# Patient Record
Sex: Male | Born: 1960 | Marital: Married | Smoking: Never smoker
Health system: Southern US, Community
[De-identification: ages and names within clinical notes are randomized; demographics above are authoritative.]

## PROBLEM LIST (undated history)

## (undated) DIAGNOSIS — F900 Attention-deficit hyperactivity disorder, predominantly inattentive type: Secondary | ICD-10-CM

## (undated) DIAGNOSIS — F329 Major depressive disorder, single episode, unspecified: Secondary | ICD-10-CM

## (undated) DIAGNOSIS — F32A Depression, unspecified: Secondary | ICD-10-CM

## (undated) DIAGNOSIS — R079 Chest pain, unspecified: Secondary | ICD-10-CM

## (undated) DIAGNOSIS — I10 Essential (primary) hypertension: Secondary | ICD-10-CM

## (undated) DIAGNOSIS — M1990 Unspecified osteoarthritis, unspecified site: Secondary | ICD-10-CM

## (undated) DIAGNOSIS — G4700 Insomnia, unspecified: Secondary | ICD-10-CM

## (undated) DIAGNOSIS — F419 Anxiety disorder, unspecified: Secondary | ICD-10-CM

## (undated) HISTORY — PX: COLONOSCOPY: SHX174

## (undated) HISTORY — PX: VARICOSE VEIN SURGERY: SHX832

## (undated) HISTORY — PX: WISDOM TOOTH EXTRACTION: SHX21

## (undated) HISTORY — PX: BICEPS TENDON REPAIR: SHX566

## (undated) HISTORY — PX: EYE SURGERY: SHX253

## (undated) HISTORY — PX: SHOULDER ARTHROSCOPY: SHX128

## (undated) SURGERY — Surgical Case
Anesthesia: *Unknown

---

## 1998-05-07 ENCOUNTER — Ambulatory Visit (HOSPITAL_COMMUNITY): Admission: RE | Admit: 1998-05-07 | Discharge: 1998-05-07 | Payer: Self-pay | Admitting: *Deleted

## 1998-05-07 ENCOUNTER — Encounter: Payer: Self-pay | Admitting: *Deleted

## 1998-05-14 ENCOUNTER — Encounter: Payer: Self-pay | Admitting: Orthopedic Surgery

## 1998-05-14 ENCOUNTER — Observation Stay (HOSPITAL_COMMUNITY): Admission: RE | Admit: 1998-05-14 | Discharge: 1998-05-15 | Payer: Self-pay | Admitting: Orthopedic Surgery

## 2005-04-22 ENCOUNTER — Ambulatory Visit: Payer: Self-pay | Admitting: Family Medicine

## 2013-04-10 NOTE — H&P (Signed)
History of Present Illness  The patient is a 53 year old male who presents today for follow up of their back. The patient is being followed for their central (low back) back pain. Symptoms reported today include: pain (improving), pain at night, aching, giving way (he states he has had several falls), weakness (right leg) and numbness. The patient states that they are doing poorly. Current treatment includes: pain medications. The following medication has been used for pain control: Tramadol (does not help). The patient reports their current pain level to be 1 / 10. The patient presents today following MRI (lumbar @ [facility]). Note for "Follow-up back": The patient has another injection with Dr Nelva Bush schedule for 02-21-13   He returns today for followup. At this point in time, he continues to have mild right radicular leg pain. The biggest problem he has noticed is weakness.   Allergies No Known Drug Allergies. 08/22/2012    Family History Hypertension. Father. father Rheumatoid Arthritis. grandmother mothers side Depression. grandfather mothers side    Social History Alcohol use. current drinker; drinks wine; only occasionally per week Children. 2 Current work status. working full time Drug/Alcohol Rehab (Currently). no Drug/Alcohol Rehab (Previously). no Exercise. Exercises daily; does running / walking and gym / weights Illicit drug use. no Living situation. live with spouse Marital status. married Number of flights of stairs before winded. 4-5 Pain Contract. no Tobacco / smoke exposure. no Tobacco use. Never smoker. never smoker; uses less than half 1/2 can(s) smokeless per week    Medication History TraMADol HCl (50MG  Tablet, 1 (one) Tablet Oral tid, Taken starting 03/06/2013) Active. (ddb/rcy) Medications Reconciled.    Other Problems Depression    Objective Transcription  On clinical exam the single leg dip on the right side,  he almost collapses to the floor. Mainly his quad strength is significantly diminished compared to the left side. EHL, tibialis anterior, gastrocnemius all 5/5. He has numbness in the anterior thigh. This has definitely progressed since his last visit in December 2014.    RADIOGRAPHS  His MRI of 03/11/2013 shows the right paracentral disc extrusion at L3-L4 is larger than the 10/08/2012 MRI and it extends further caudal from the disc level then previously with marked mass effect on the ventral thecal sac and traversing L4 nerve root and even the exiting L3 nerve root. It also produces severe canal stenosis, which is also progressed. He has moderate changes at L4-L5 and L5-S1.   Assessment & Plan  Risks of surgery include, but are not limited to: Death, stroke, paralysis, nerve root damage/injury, bleeding, blood clots, loss of bowel/bladder control, sexual dysfunction, retrograde ejaculation, hardware failure, or malposition, spinal fluid leak, adjacent segment disease, non-union, need for further surgery, ongoing or worse pain, injury to bladder, bowel and abdominal contents, infection and recurrent disc herniation   At this point in time clinically I am quit concerned about the progressive weakness. Even though his pain is not debilitating, he is definitely showing progressive neurological deterioration. I have counseled him about proceeding with the lumbar decompression and discectomy, but he has financial concerns which I can understand. He is going to talk to his insurance company and consider his options. If he decides not to proceed with surgery at this point then the only thing I could recommend is a therapy program to strengthen his back muscles as best we can. Also to try to maintain the strength of his quad and hip flexors on the right side as best we can. I did  tell him that the longer the nerve goes pressed, the less predictable the outcome. All his questions were encouraged. He was  present for the dictation. I will see him in 4 weeks unless he makes a decision about surgery before then. He knows to contact me either by email through our web portal or calling me directly here at the office to let me know how he would like to proceed. If he contacts me, I would proceed with the L3-L4 decompression and discectomy. The risks include infection, bleeding, nerve damage, death, stroke, paralysis, failure to heal, need for further surgery such as fusion surgery, ongoing or worse pain, no relief of his current pain.

## 2013-04-12 ENCOUNTER — Encounter (HOSPITAL_COMMUNITY): Payer: Self-pay | Admitting: Pharmacy Technician

## 2013-04-17 NOTE — Pre-Procedure Instructions (Signed)
KHUP SAPIA  04/17/2013   Your procedure is scheduled on:  Thursday, February 26.  Report to [facility], Main Entrance [facility] "[facility]" at 10:20 AM.  Call this number if you have problems the morning of surgery: [phone]   Remember:   Do not eat food or drink liquids after midnight Wednesday.   Take these medicines the morning of surgery with A SIP OF WATER: Take if needed:  traMADol Veatrice Bourbon). Stop taking: ibuprofen (ADVIL,MOTRIN).     Do not wear jewelry, make-up or nail polish.  Do not wear lotions, powders, or perfumes. You may wear deodorant.   Men may shave face and neck.  Do not bring valuables to the hospital.  [facility] is not responsible for any belongings or valuables.               Contacts, dentures or bridgework may not be worn into surgery.  Leave suitcase in the car. After surgery it may be brought to your room.  For patients admitted to the hospital, discharge time is determined by your treatment team.               Patients discharged the day of surgery will not be allowed to drive home.  Name and phone number of your driver: -   Special Instructions: Review  [facility] - Preparing For Surgery.   Please read over the following fact sheets that you were given: Pain Booklet, Coughing and Deep Breathing and Surgical Site Infection Prevention

## 2013-04-18 ENCOUNTER — Encounter (HOSPITAL_COMMUNITY): Payer: Self-pay

## 2013-04-18 ENCOUNTER — Encounter (HOSPITAL_COMMUNITY)
Admission: RE | Admit: 2013-04-18 | Discharge: 2013-04-18 | Disposition: A | Discharge status: Home / Self Care | Payer: PRIVATE HEALTH INSURANCE | Source: Ambulatory Visit | Attending: Orthopedic Surgery | Admitting: Orthopedic Surgery

## 2013-04-18 HISTORY — DX: Unspecified osteoarthritis, unspecified site: M19.90

## 2013-04-18 LAB — CBC
HEMATOCRIT: 46.1 % (ref 39.0–52.0)
HEMOGLOBIN: 16.2 g/dL (ref 13.0–17.0)
MCH: 29.8 pg (ref 26.0–34.0)
MCHC: 35.1 g/dL (ref 30.0–36.0)
MCV: 84.9 fL (ref 78.0–100.0)
Platelets: 261 10*3/uL (ref 150–400)
RBC: 5.43 MIL/uL (ref 4.22–5.81)
RDW: 13.1 % (ref 11.5–15.5)
WBC: 6.1 10*3/uL (ref 4.0–10.5)

## 2013-04-18 LAB — SURGICAL PCR SCREEN
MRSA, PCR: NEGATIVE
STAPHYLOCOCCUS AUREUS: NEGATIVE

## 2013-04-19 MED ORDER — CEFAZOLIN SODIUM-DEXTROSE 2-3 GM-% IV SOLR
2.0000 g | INTRAVENOUS | Status: AC
  Administered 2013-04-20: 2 g via INTRAVENOUS
  Filled 2013-04-19: qty 50

## 2013-04-19 MED ORDER — DEXAMETHASONE SODIUM PHOSPHATE 4 MG/ML IJ SOLN
4.0000 mg | Freq: Once | INTRAMUSCULAR | Status: DC
  Filled 2013-04-19: qty 1

## 2013-04-19 MED ORDER — ACETAMINOPHEN 10 MG/ML IV SOLN: 1000.0000 mg | Freq: Four times a day (QID) | INTRAVENOUS | Status: DC

## 2013-04-20 ENCOUNTER — Encounter (HOSPITAL_COMMUNITY): Payer: PRIVATE HEALTH INSURANCE | Admitting: Anesthesiology

## 2013-04-20 ENCOUNTER — Ambulatory Visit (HOSPITAL_COMMUNITY): Payer: PRIVATE HEALTH INSURANCE | Admitting: Anesthesiology

## 2013-04-20 ENCOUNTER — Ambulatory Visit (HOSPITAL_COMMUNITY): Payer: PRIVATE HEALTH INSURANCE

## 2013-04-20 ENCOUNTER — Encounter (HOSPITAL_COMMUNITY)
Admission: RE | Disposition: A | Discharge status: Home / Self Care | Payer: Self-pay | Source: Ambulatory Visit | Attending: Orthopedic Surgery

## 2013-04-20 ENCOUNTER — Observation Stay (HOSPITAL_COMMUNITY)
Admission: RE | Admit: 2013-04-20 | Discharge: 2013-04-21 | Disposition: A | Discharge status: Home / Self Care | Payer: PRIVATE HEALTH INSURANCE | Source: Ambulatory Visit | Attending: Orthopedic Surgery | Admitting: Orthopedic Surgery

## 2013-04-20 ENCOUNTER — Encounter (HOSPITAL_COMMUNITY): Payer: Self-pay | Admitting: *Deleted

## 2013-04-20 DIAGNOSIS — F3289 Other specified depressive episodes: Secondary | ICD-10-CM | POA: Insufficient documentation

## 2013-04-20 DIAGNOSIS — M5126 Other intervertebral disc displacement, lumbar region: Principal | ICD-10-CM | POA: Insufficient documentation

## 2013-04-20 DIAGNOSIS — M549 Dorsalgia, unspecified: Secondary | ICD-10-CM | POA: Diagnosis present

## 2013-04-20 DIAGNOSIS — F329 Major depressive disorder, single episode, unspecified: Secondary | ICD-10-CM | POA: Insufficient documentation

## 2013-04-20 DIAGNOSIS — M48061 Spinal stenosis, lumbar region without neurogenic claudication: Secondary | ICD-10-CM | POA: Insufficient documentation

## 2013-04-20 DIAGNOSIS — Z9889 Other specified postprocedural states: Secondary | ICD-10-CM

## 2013-04-20 DIAGNOSIS — F17200 Nicotine dependence, unspecified, uncomplicated: Secondary | ICD-10-CM | POA: Insufficient documentation

## 2013-04-20 HISTORY — PX: LUMBAR LAMINECTOMY/DECOMPRESSION MICRODISCECTOMY: SHX5026

## 2013-04-20 SURGERY — LUMBAR LAMINECTOMY/DECOMPRESSION MICRODISCECTOMY 1 LEVEL
Anesthesia: General | Site: Back | Laterality: Right

## 2013-04-20 MED ORDER — PROPOFOL 10 MG/ML IV BOLUS
INTRAVENOUS | Status: DC | PRN
  Administered 2013-04-20: 190 mg via INTRAVENOUS

## 2013-04-20 MED ORDER — LIDOCAINE HCL (CARDIAC) 20 MG/ML IV SOLN
INTRAVENOUS | Status: AC
  Filled 2013-04-20: qty 5

## 2013-04-20 MED ORDER — SODIUM CHLORIDE 0.9 % IJ SOLN: 3.0000 mL | Freq: Two times a day (BID) | INTRAMUSCULAR | Status: DC

## 2013-04-20 MED ORDER — LIDOCAINE HCL (CARDIAC) 20 MG/ML IV SOLN
INTRAVENOUS | Status: DC | PRN
  Administered 2013-04-20: 100 mg via INTRAVENOUS

## 2013-04-20 MED ORDER — MIDAZOLAM HCL 2 MG/2ML IJ SOLN
INTRAMUSCULAR | Status: AC
  Filled 2013-04-20: qty 2

## 2013-04-20 MED ORDER — OXYCODONE HCL 5 MG PO TABS
ORAL_TABLET | ORAL | Status: AC
  Filled 2013-04-20: qty 1

## 2013-04-20 MED ORDER — HYDROMORPHONE HCL PF 1 MG/ML IJ SOLN
0.2500 mg | INTRAMUSCULAR | Status: DC | PRN
  Administered 2013-04-20 (×4): 0.5 mg via INTRAVENOUS

## 2013-04-20 MED ORDER — THROMBIN 20000 UNITS EX KIT: PACK | CUTANEOUS | Status: DC | PRN

## 2013-04-20 MED ORDER — MIDAZOLAM HCL 5 MG/5ML IJ SOLN
INTRAMUSCULAR | Status: DC | PRN
  Administered 2013-04-20: 2 mg via INTRAVENOUS

## 2013-04-20 MED ORDER — SODIUM CHLORIDE 0.9 % IJ SOLN: 3.0000 mL | INTRAMUSCULAR | Status: DC | PRN

## 2013-04-20 MED ORDER — GLYCOPYRROLATE 0.2 MG/ML IJ SOLN
INTRAMUSCULAR | Status: AC
  Filled 2013-04-20: qty 2

## 2013-04-20 MED ORDER — ACETAMINOPHEN 10 MG/ML IV SOLN
1000.0000 mg | Freq: Once | INTRAVENOUS | Status: AC
  Administered 2013-04-20: 1000 mg via INTRAVENOUS
  Filled 2013-04-20: qty 100

## 2013-04-20 MED ORDER — PHENOL 1.4 % MT LIQD: 1.0000 | OROMUCOSAL | Status: DC | PRN

## 2013-04-20 MED ORDER — THROMBIN 20000 UNITS EX SOLR
CUTANEOUS | Status: AC
  Filled 2013-04-20: qty 20000

## 2013-04-20 MED ORDER — ROCURONIUM BROMIDE 50 MG/5ML IV SOLN
INTRAVENOUS | Status: AC
  Filled 2013-04-20: qty 1

## 2013-04-20 MED ORDER — CEFAZOLIN SODIUM 1-5 GM-% IV SOLN
1.0000 g | Freq: Three times a day (TID) | INTRAVENOUS | Status: AC
  Administered 2013-04-20 – 2013-04-21 (×2): 1 g via INTRAVENOUS
  Filled 2013-04-20 (×2): qty 50

## 2013-04-20 MED ORDER — ONDANSETRON HCL 4 MG/2ML IJ SOLN
INTRAMUSCULAR | Status: AC
  Filled 2013-04-20: qty 2

## 2013-04-20 MED ORDER — NEOSTIGMINE METHYLSULFATE 1 MG/ML IJ SOLN
INTRAMUSCULAR | Status: DC | PRN
  Administered 2013-04-20: 3 mg via INTRAVENOUS

## 2013-04-20 MED ORDER — ONDANSETRON HCL 4 MG/2ML IJ SOLN: 4.0000 mg | INTRAMUSCULAR | Status: DC | PRN

## 2013-04-20 MED ORDER — ONDANSETRON HCL 4 MG/2ML IJ SOLN
INTRAMUSCULAR | Status: DC | PRN
  Administered 2013-04-20: 4 mg via INTRAVENOUS

## 2013-04-20 MED ORDER — FENTANYL CITRATE 0.05 MG/ML IJ SOLN
INTRAMUSCULAR | Status: AC
  Filled 2013-04-20: qty 2

## 2013-04-20 MED ORDER — PROPOFOL 10 MG/ML IV BOLUS
INTRAVENOUS | Status: AC
  Filled 2013-04-20: qty 20

## 2013-04-20 MED ORDER — ROCURONIUM BROMIDE 100 MG/10ML IV SOLN
INTRAVENOUS | Status: DC | PRN
  Administered 2013-04-20: 50 mg via INTRAVENOUS

## 2013-04-20 MED ORDER — ARTIFICIAL TEARS OP OINT
TOPICAL_OINTMENT | OPHTHALMIC | Status: DC | PRN
  Administered 2013-04-20: 1 via OPHTHALMIC

## 2013-04-20 MED ORDER — DEXAMETHASONE 4 MG PO TABS
4.0000 mg | ORAL_TABLET | Freq: Four times a day (QID) | ORAL | Status: DC
  Administered 2013-04-20 – 2013-04-21 (×3): 4 mg via ORAL
  Filled 2013-04-20 (×7): qty 1

## 2013-04-20 MED ORDER — OXYCODONE HCL 5 MG PO TABS
10.0000 mg | ORAL_TABLET | ORAL | Status: DC | PRN
  Administered 2013-04-20 – 2013-04-21 (×3): 10 mg via ORAL
  Filled 2013-04-20 (×3): qty 2

## 2013-04-20 MED ORDER — METHOCARBAMOL 100 MG/ML IJ SOLN
500.0000 mg | Freq: Four times a day (QID) | INTRAMUSCULAR | Status: DC | PRN
  Filled 2013-04-20: qty 5

## 2013-04-20 MED ORDER — LACTATED RINGERS IV SOLN: INTRAVENOUS | Status: DC

## 2013-04-20 MED ORDER — THROMBIN 20000 UNITS EX SOLR
CUTANEOUS | Status: DC | PRN
  Administered 2013-04-20: 13:00:00 via TOPICAL

## 2013-04-20 MED ORDER — OXYCODONE HCL 5 MG/5ML PO SOLN: 5.0000 mg | Freq: Once | ORAL | Status: AC | PRN

## 2013-04-20 MED ORDER — 0.9 % SODIUM CHLORIDE (POUR BTL) OPTIME
TOPICAL | Status: DC | PRN
  Administered 2013-04-20: 1000 mL

## 2013-04-20 MED ORDER — ACETAMINOPHEN 10 MG/ML IV SOLN
1000.0000 mg | Freq: Four times a day (QID) | INTRAVENOUS | Status: DC
  Administered 2013-04-20 – 2013-04-21 (×3): 1000 mg via INTRAVENOUS
  Filled 2013-04-20 (×4): qty 100

## 2013-04-20 MED ORDER — FENTANYL CITRATE 0.05 MG/ML IJ SOLN
INTRAMUSCULAR | Status: AC
Start: 2013-04-20 — End: 2013-04-20
  Filled 2013-04-20: qty 5

## 2013-04-20 MED ORDER — HYDROMORPHONE HCL PF 1 MG/ML IJ SOLN
INTRAMUSCULAR | Status: AC
  Filled 2013-04-20: qty 1

## 2013-04-20 MED ORDER — BUPIVACAINE-EPINEPHRINE (PF) 0.25% -1:200000 IJ SOLN
INTRAMUSCULAR | Status: AC
  Filled 2013-04-20: qty 30

## 2013-04-20 MED ORDER — SODIUM CHLORIDE 0.9 % IV SOLN
250.0000 mL | INTRAVENOUS | Status: DC
  Administered 2013-04-20: 250 mL via INTRAVENOUS

## 2013-04-20 MED ORDER — METHOCARBAMOL 500 MG PO TABS
ORAL_TABLET | ORAL | Status: AC
  Filled 2013-04-20: qty 1

## 2013-04-20 MED ORDER — MIDAZOLAM HCL 2 MG/2ML IJ SOLN: 1.0000 mg | INTRAMUSCULAR | Status: DC | PRN

## 2013-04-20 MED ORDER — MORPHINE SULFATE 2 MG/ML IJ SOLN
1.0000 mg | INTRAMUSCULAR | Status: DC | PRN
  Administered 2013-04-20 (×2): 4 mg via INTRAVENOUS
  Filled 2013-04-20 (×2): qty 2

## 2013-04-20 MED ORDER — DEXAMETHASONE SODIUM PHOSPHATE 10 MG/ML IJ SOLN
INTRAMUSCULAR | Status: AC
  Filled 2013-04-20: qty 1

## 2013-04-20 MED ORDER — DEXAMETHASONE SODIUM PHOSPHATE 10 MG/ML IJ SOLN
INTRAMUSCULAR | Status: DC | PRN
  Administered 2013-04-20: 10 mg via INTRAVENOUS

## 2013-04-20 MED ORDER — METHOCARBAMOL 500 MG PO TABS
500.0000 mg | ORAL_TABLET | Freq: Four times a day (QID) | ORAL | Status: DC | PRN
  Administered 2013-04-20 – 2013-04-21 (×3): 500 mg via ORAL
  Filled 2013-04-20 (×2): qty 1

## 2013-04-20 MED ORDER — BUPIVACAINE-EPINEPHRINE 0.25% -1:200000 IJ SOLN
INTRAMUSCULAR | Status: DC | PRN
  Administered 2013-04-20: 10 mL

## 2013-04-20 MED ORDER — PROMETHAZINE HCL 25 MG/ML IJ SOLN: 6.2500 mg | INTRAMUSCULAR | Status: DC | PRN

## 2013-04-20 MED ORDER — OXYCODONE HCL 5 MG PO TABS
5.0000 mg | ORAL_TABLET | Freq: Once | ORAL | Status: AC | PRN
  Administered 2013-04-20: 5 mg via ORAL

## 2013-04-20 MED ORDER — DEXAMETHASONE SODIUM PHOSPHATE 4 MG/ML IJ SOLN
4.0000 mg | Freq: Four times a day (QID) | INTRAMUSCULAR | Status: DC
  Filled 2013-04-20 (×4): qty 1

## 2013-04-20 MED ORDER — GLYCOPYRROLATE 0.2 MG/ML IJ SOLN
INTRAMUSCULAR | Status: DC | PRN
  Administered 2013-04-20: 0.4 mg via INTRAVENOUS

## 2013-04-20 MED ORDER — FENTANYL CITRATE 0.05 MG/ML IJ SOLN
INTRAMUSCULAR | Status: DC | PRN
  Administered 2013-04-20 (×2): 50 ug via INTRAVENOUS
  Administered 2013-04-20: 100 ug via INTRAVENOUS

## 2013-04-20 MED ORDER — LACTATED RINGERS IV SOLN
INTRAVENOUS | Status: DC
  Administered 2013-04-20: 10:00:00 via INTRAVENOUS

## 2013-04-20 MED ORDER — FENTANYL CITRATE 0.05 MG/ML IJ SOLN: 50.0000 ug | Freq: Once | INTRAMUSCULAR | Status: DC

## 2013-04-20 MED ORDER — NEOSTIGMINE METHYLSULFATE 1 MG/ML IJ SOLN
INTRAMUSCULAR | Status: AC
  Filled 2013-04-20: qty 10

## 2013-04-20 MED ORDER — LACTATED RINGERS IV SOLN
INTRAVENOUS | Status: DC | PRN
  Administered 2013-04-20 (×2): via INTRAVENOUS

## 2013-04-20 MED ORDER — MENTHOL 3 MG MT LOZG
1.0000 | LOZENGE | OROMUCOSAL | Status: DC | PRN
  Administered 2013-04-20: 3 mg via ORAL
  Filled 2013-04-20: qty 9

## 2013-04-20 MED ORDER — HEMOSTATIC AGENTS (NO CHARGE) OPTIME
TOPICAL | Status: DC | PRN
  Administered 2013-04-20: 1 via TOPICAL

## 2013-04-20 SURGICAL SUPPLY — 59 items
BUR EGG ELITE 4.0 (BURR) IMPLANT
BUR EGG ELITE 4.0MM (BURR)
BUR MATCHSTICK NEURO 3.0 LAGG (BURR) IMPLANT
CANISTER SUCTION 2500CC (MISCELLANEOUS) ×3 IMPLANT
CLOSURE STERI-STRIP 1/2X4 (GAUZE/BANDAGES/DRESSINGS) ×1
CLOTH BEACON ORANGE TIMEOUT ST (SAFETY) ×3 IMPLANT
CLSR STERI-STRIP ANTIMIC 1/2X4 (GAUZE/BANDAGES/DRESSINGS) ×2 IMPLANT
CORDS BIPOLAR (ELECTRODE) ×3 IMPLANT
COVER SURGICAL LIGHT HANDLE (MISCELLANEOUS) ×3 IMPLANT
DRAIN CHANNEL 15F RND FF W/TCR (WOUND CARE) ×3 IMPLANT
DRAPE POUCH INSTRU U-SHP 10X18 (DRAPES) ×3 IMPLANT
DRAPE SURG 17X23 STRL (DRAPES) ×3 IMPLANT
DRAPE U-SHAPE 47X51 STRL (DRAPES) ×3 IMPLANT
DRSG MEPILEX BORDER 4X4 (GAUZE/BANDAGES/DRESSINGS) IMPLANT
DRSG MEPILEX BORDER 4X8 (GAUZE/BANDAGES/DRESSINGS) ×3 IMPLANT
DURAPREP 26ML APPLICATOR (WOUND CARE) ×3 IMPLANT
ELECT BLADE 4.0 EZ CLEAN MEGAD (MISCELLANEOUS)
ELECT CAUTERY BLADE 6.4 (BLADE) ×3 IMPLANT
ELECT REM PT RETURN 9FT ADLT (ELECTROSURGICAL) ×3
ELECTRODE BLDE 4.0 EZ CLN MEGD (MISCELLANEOUS) IMPLANT
ELECTRODE REM PT RTRN 9FT ADLT (ELECTROSURGICAL) ×1 IMPLANT
EVACUATOR SILICONE 100CC (DRAIN) ×3 IMPLANT
GLOVE BIOGEL PI IND STRL 8 (GLOVE) ×1 IMPLANT
GLOVE BIOGEL PI IND STRL 8.5 (GLOVE) ×1 IMPLANT
GLOVE BIOGEL PI INDICATOR 8 (GLOVE) ×2
GLOVE BIOGEL PI INDICATOR 8.5 (GLOVE) ×2
GLOVE ECLIPSE 8.5 STRL (GLOVE) ×3 IMPLANT
GLOVE ORTHO TXT STRL SZ7.5 (GLOVE) ×3 IMPLANT
GOWN STRL NON-REIN LRG LVL3 (GOWN DISPOSABLE) ×3 IMPLANT
GOWN STRL REIN 2XL XLG LVL4 (GOWN DISPOSABLE) ×3 IMPLANT
GOWN STRL REIN XL XLG (GOWN DISPOSABLE) ×6 IMPLANT
GOWN STRL REUS W/TWL 2XL LVL3 (GOWN DISPOSABLE) ×3 IMPLANT
KIT BASIN OR (CUSTOM PROCEDURE TRAY) ×3 IMPLANT
KIT ROOM TURNOVER OR (KITS) ×3 IMPLANT
NDL SPNL 18GX3.5 QUINCKE PK (NEEDLE) ×2 IMPLANT
NEEDLE 22X1 1/2 (OR ONLY) (NEEDLE) ×3 IMPLANT
NEEDLE SPNL 18GX3.5 QUINCKE PK (NEEDLE) ×6 IMPLANT
NS IRRIG 1000ML POUR BTL (IV SOLUTION) ×3 IMPLANT
PACK LAMINECTOMY ORTHO (CUSTOM PROCEDURE TRAY) ×3 IMPLANT
PACK UNIVERSAL I (CUSTOM PROCEDURE TRAY) ×3 IMPLANT
PAD ARMBOARD 7.5X6 YLW CONV (MISCELLANEOUS) ×6 IMPLANT
PATTIES SURGICAL .5 X.5 (GAUZE/BANDAGES/DRESSINGS) IMPLANT
PATTIES SURGICAL .5 X1 (DISPOSABLE) ×3 IMPLANT
SPONGE LAP 4X18 X RAY DECT (DISPOSABLE) ×4 IMPLANT
SPONGE SURGIFOAM ABS GEL 100 (HEMOSTASIS) IMPLANT
SURGIFLO TRUKIT (HEMOSTASIS) ×2 IMPLANT
SUT MON AB 3-0 SH 27 (SUTURE) ×3
SUT MON AB 3-0 SH27 (SUTURE) ×1 IMPLANT
SUT VIC AB 0 CT1 27 (SUTURE) ×3
SUT VIC AB 0 CT1 27XBRD ANBCTR (SUTURE) ×1 IMPLANT
SUT VIC AB 1 CTX 36 (SUTURE) ×6
SUT VIC AB 1 CTX36XBRD ANBCTR (SUTURE) ×2 IMPLANT
SUT VIC AB 2-0 CT1 18 (SUTURE) ×3 IMPLANT
SYR BULB IRRIGATION 50ML (SYRINGE) ×3 IMPLANT
SYR CONTROL 10ML LL (SYRINGE) ×3 IMPLANT
TOWEL OR 17X24 6PK STRL BLUE (TOWEL DISPOSABLE) ×3 IMPLANT
TOWEL OR 17X26 10 PK STRL BLUE (TOWEL DISPOSABLE) ×3 IMPLANT
WATER STERILE IRR 1000ML POUR (IV SOLUTION) ×1 IMPLANT
YANKAUER SUCT BULB TIP NO VENT (SUCTIONS) ×3 IMPLANT

## 2013-04-20 NOTE — H&P (Signed)
No change in clinical exam H+P reviewed  

## 2013-04-20 NOTE — Preoperative (Signed)
Beta Blockers   Reason not to administer Beta Blockers:Not Applicable 

## 2013-04-20 NOTE — Brief Op Note (Signed)
04/20/2013  1:58 PM  PATIENT:  Christopher Rogers  53 y.o. 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  PROCEDURE:  Procedure(s): L3-L4 DECOMPRESSION DISCECTOMY (1 LEVEL) (Right)  SURGEON:  Surgeon(s) and Role:    * Melina Schools, MD - Primary  PHYSICIAN ASSISTANT:   ASSISTANTS: Benjiman Core   ANESTHESIA:   general  EBL:  Total I/O In: 1150 [I.V.:1150] Out: -   BLOOD ADMINISTERED:none  DRAINS: none   LOCAL MEDICATIONS USED:  MARCAINE     SPECIMEN:  No Specimen  DISPOSITION OF SPECIMEN:  N/A  COUNTS:  YES  TOURNIQUET:  * No tourniquets in log *  DICTATION: .Other Dictation: Dictation Number [id number]  PLAN OF CARE: Admit for overnight observation  PATIENT DISPOSITION:  PACU - hemodynamically stable.

## 2013-04-20 NOTE — Transfer of Care (Signed)
Immediate Anesthesia Transfer of Care Note  Patient: Christopher Rogers  Procedure(s) Performed: Procedure(s): L3-L4 DECOMPRESSION DISCECTOMY (1 LEVEL) (Right)  Patient Location: PACU  Anesthesia Type:General  Level of Consciousness: oriented and patient cooperative  Airway & Oxygen Therapy: Patient Spontanous Breathing and Patient connected to nasal cannula oxygen  Post-op Assessment: Report given to PACU RN and Post -op Vital signs reviewed and stable  Post vital signs: Reviewed  Complications: No apparent anesthesia complications

## 2013-04-20 NOTE — Anesthesia Procedure Notes (Signed)
Procedure Name: Intubation Date/Time: 04/20/2013 12:22 PM Performed by: Jenne Campus Pre-anesthesia Checklist: Patient identified, Emergency Drugs available, Suction available, Patient being monitored and Timeout performed Patient Re-evaluated:Patient Re-evaluated prior to inductionOxygen Delivery Method: Circle system utilized Preoxygenation: Pre-oxygenation with 100% oxygen Intubation Type: IV induction Ventilation: Mask ventilation without difficulty Laryngoscope Size: Miller and 2 Grade View: Grade II Tube type: Oral Tube size: 7.5 mm Number of attempts: 1 Airway Equipment and Method: Stylet Placement Confirmation: ETT inserted through vocal cords under direct vision,  positive ETCO2,  CO2 detector and breath sounds checked- equal and bilateral Secured at: 22 cm Tube secured with: Tape Dental Injury: Teeth and Oropharynx as per pre-operative assessment

## 2013-04-20 NOTE — Anesthesia Preprocedure Evaluation (Signed)
Anesthesia Evaluation  Patient identified by MRN, date of birth, ID band Patient awake    Reviewed: Allergy & Precautions, H&P , NPO status , Patient's Chart, lab work & pertinent test results  History of Anesthesia Complications (+) PONV  Airway Mallampati: II TM Distance: >3 FB Neck ROM: Full    Dental   Pulmonary former smoker,  breath sounds clear to auscultation        Cardiovascular Rhythm:Regular Rate:Normal     Neuro/Psych    GI/Hepatic   Endo/Other    Renal/GU      Musculoskeletal   Abdominal   Peds  Hematology   Anesthesia Other Findings   Reproductive/Obstetrics                           Anesthesia Physical Anesthesia Plan  ASA: I  Anesthesia Plan: General   Post-op Pain Management:    Induction: Intravenous  Airway Management Planned: Oral ETT  Additional Equipment:   Intra-op Plan:   Post-operative Plan: Extubation in OR  Informed Consent: I have reviewed the patients History and Physical, chart, labs and discussed the procedure including the risks, benefits and alternatives for the proposed anesthesia with the patient or authorized representative who has indicated his/her understanding and acceptance.     Plan Discussed with: CRNA and Surgeon  Anesthesia Plan Comments:         Anesthesia Quick Evaluation

## 2013-04-21 ENCOUNTER — Encounter (HOSPITAL_COMMUNITY): Payer: Self-pay | Admitting: Orthopedic Surgery

## 2013-04-21 MED ORDER — METHOCARBAMOL 500 MG PO TABS: 500.0000 mg | ORAL_TABLET | Freq: Four times a day (QID) | ORAL | Status: DC | PRN

## 2013-04-21 MED ORDER — ONDANSETRON 4 MG PO TBDP: 4.0000 mg | ORAL_TABLET | Freq: Three times a day (TID) | ORAL | Status: DC | PRN

## 2013-04-21 MED ORDER — POLYETHYLENE GLYCOL 3350 17 G PO PACK: 17.0000 g | PACK | Freq: Every day | ORAL | Status: DC

## 2013-04-21 MED ORDER — OXYCODONE-ACETAMINOPHEN 10-325 MG PO TABS: 1.0000 | ORAL_TABLET | Freq: Four times a day (QID) | ORAL | Status: DC | PRN

## 2013-04-21 MED ORDER — ZOLPIDEM TARTRATE ER 6.25 MG PO TBCR: 6.2500 mg | EXTENDED_RELEASE_TABLET | Freq: Every evening | ORAL | Status: DC | PRN

## 2013-04-21 MED ORDER — DOCUSATE SODIUM 100 MG PO CAPS: 100.0000 mg | ORAL_CAPSULE | Freq: Two times a day (BID) | ORAL | Status: DC

## 2013-04-21 NOTE — Progress Notes (Signed)
Pt. Alert and oriented,follows simple instructions, denies pain. Incision area without swelling, redness or S/S of infection. Voiding adequate clear yellow urine. Moving all extremities well and vitals stable and documented. Lumbar surgery notes instructions given to patient and family member for home safety and precautions. Pt. and family stated understanding of instructions given.  

## 2013-04-21 NOTE — Anesthesia Postprocedure Evaluation (Signed)
  Anesthesia Post-op Note  Patient: Christopher Rogers  Procedure(s) Performed: Procedure(s): L3-L4 DECOMPRESSION DISCECTOMY (1 LEVEL) (Right)  Patient Location: PACU  Anesthesia Type:General  Level of Consciousness: awake and alert   Airway and Oxygen Therapy: Patient Spontanous Breathing  Post-op Pain: mild  Post-op Assessment: Post-op Vital signs reviewed, Patient's Cardiovascular Status Stable and Respiratory Function Stable  Post-op Vital Signs: Reviewed  Filed Vitals:   04/21/13 0829  BP: 111/69  Pulse: 86  Temp: 37.1 C  Resp: 20    Complications: No apparent anesthesia complications

## 2013-04-21 NOTE — Evaluation (Signed)
Occupational Therapy Evaluation Patient Details Name: Christopher Rogers MRN: [id number] DOB: 1960-12-13 Today's Date: 04/21/2013 Time: 7371-0626 OT Time Calculation (min): 14 min  OT Assessment / Plan / Recommendation History of present illness s/p L3-L4 DECOMPRESSION DISCECTOMY    Clinical Impression   Pt admitted with above.  Pt is overall mod I level with mobility and ADLs.  Education completed. No further acute OT needed. Pt plans to d/c home today.    OT Assessment  Patient does not need any further OT services    Follow Up Recommendations  No OT follow up    Barriers to Discharge      Equipment Recommendations  None recommended by OT    Recommendations for Other Services    Frequency       Precautions / Restrictions Precautions Precautions: Back Precaution Comments: Educated pt on 3/3 back precautions.   Pertinent Vitals/Pain See vitals    ADL  Eating/Feeding: Performed;Independent Where Assessed - Eating/Feeding: Edge of bed Upper Body Bathing: Simulated;Modified independent Where Assessed - Upper Body Bathing: Unsupported sitting Lower Body Bathing: Simulated;Modified independent Where Assessed - Lower Body Bathing: Unsupported sit to stand Upper Body Dressing: Performed;Modified independent Where Assessed - Upper Body Dressing: Unsupported sitting Lower Body Dressing: Performed;Modified independent Where Assessed - Lower Body Dressing: Unsupported sit to stand Toilet Transfer: Simulated;Modified independent Toilet Transfer Method: Sit to Loss adjuster, chartered:  (bed) Transfers/Ambulation Related to ADLs: mod I ADL Comments: Educated pt and significant other on back precautions and ADL techniques to maintain precautions. Recommended having home set up when he will be alone so that he can retrieve ADL items without difficulty.    OT Diagnosis:    OT Problem List:   OT Treatment Interventions:     OT Goals(Current goals can be found in the care plan  section)    Visit Information  Last OT Received On: 04/21/13 History of Present Illness: s/p L3-L4 DECOMPRESSION DISCECTOMY        Prior Functioning     Home Living Family/patient expects to be discharged to:: Private residence Living Arrangements: Spouse/significant other Available Help at Discharge: Family;Available PRN/intermittently Type of Home: House Home Access: Stairs to enter Entrance Stairs-Number of Steps: 3-4 Home Layout: Two level Alternate Level Stairs-Number of Steps: 20 Home Equipment: None Additional Comments: Living room is located down stairs.  He can access living room from garage if needed.   Prior Function Level of Independence: Independent         Vision/Perception     Cognition  Cognition Arousal/Alertness: Awake/alert Behavior During Therapy: WFL for tasks assessed/performed Overall Cognitive Status: Within Functional Limits for tasks assessed    Extremity/Trunk Assessment Upper Extremity Assessment Upper Extremity Assessment: Overall WFL for tasks assessed     Mobility Bed Mobility Overal bed mobility: Modified Independent Transfers Overall transfer level: Modified independent     Exercise     Balance     End of Session OT - End of Session Activity Tolerance: Patient tolerated treatment well Patient left: with family/visitor present (standing in room) Nurse Communication: Mobility status  GO Functional Assessment Tool Used: clinical observation Functional Limitation: Self care Self Care Current Status (R4854): 0 percent impaired, limited or restricted Self Care Goal Status (O2703): 0 percent impaired, limited or restricted Self Care Discharge Status [phone]): 0 percent impaired, limited or restricted  04/21/2013 Darrol Jump OTR/L Pager [phone] Office [phone]  Darrol Jump 04/21/2013, 10:16 AM

## 2013-04-21 NOTE — Op Note (Signed)
NAMEMarland Kitchen  PROSPER, PAFF NO.:  [id number]  MEDICAL RECORD NO.:  [id number]  LOCATION:  3C06C                        FACILITY:  [facility]  PHYSICIAN:  Dahlia Bailiff, MD    DATE OF BIRTH:  05-24-1960  DATE OF PROCEDURE:  04/20/2013 DATE OF DISCHARGE:                              OPERATIVE REPORT   PREOPERATIVE DIAGNOSES:  Right displaced proximal humerus fracture dislocation, L3-4 spinal stenosis.  POSTOPERATIVE DIAGNOSES:  Right displaced proximal humerus fracture dislocation, L3-4 spinal stenosis.  OPERATIVE PROCEDURE:  Central decompression laminotomy L3-4 with facetectomy and partial diskectomy L3-4 right side.  COMPLICATIONS:  None.  CONDITION:  Stable.  FIRST ASSISTANT:  Benjiman Core, PA.  HISTORY:  This is a very pleasant gentleman who has had progressive weakness and right leg pain.  He has had back pain but it has gotten worse recently.  MRI confirmed of increase in size in the disk herniation at L3-4.  Given the progressive neurological deficits, we elected to proceed with surgery.  OPERATIVE NOTE:  The patient was brought to the operating room, placed supine on the operating table.  After successful induction of general anesthesia and endotracheal intubation, TEDs, SCDs were applied.  He was turned prone onto a Wilson frame.  All bony prominences were well padded and the back was prepped and draped in standard fashion.  Time-out was taken to confirm patient, procedure, and all other pertinent important data.  Once this was completed, 2 needles were placed in the back and an x-ray was taken to confirm incision site.  Once this was done, the incision site was infiltrated with 0.25% Marcaine and then a midline incision was made centered over the L3-4 disk.  Sharp dissection was carried out down to the deep fascia.  Deep fascia was sharply incised, and I stripped the paraspinal muscles using a Cobb elevator and a Bovie to expose the L3 and L4 spinous  processes.  I placed a Penfield 4 underneath the L3 lamina and took a second x-ray to confirm that I was at the appropriate level.  Once this was done, I removed the superior portion of the L4 spinous process and the inferior portion of the L3 spinous process.  I then used a fine nerve hook to develop a plane underneath the L3 lamina and performed a generous laminotomy of L3.  I then developed a plane in the central raphe with the plate and removed the ligamentum flavum using a 2 and 3 mm Kerrison rongeurs.  Once I had the central decompression, I then worked into the left lateral gutter removing the osteophyte and buckled ligamentum flavum.  Once I had the posterior lateral gutter decompressed, I then went to the contralateral side and began working on the right side of the decompression.  Once I had the majority of this done, I went back to the right side and then using a nerve hook began sweeping the posterolateral gutter.  I then expressed 3 large fragments of disk material and removed them.  At this point, I then identified the 3-4 disk space itself and identified the central disk herniation.  This was more calcified and chronic in nature. It was also adherent to the  undersurface of the thecal sac.  I performed an annulotomy with a 15 blade scalpel, and then began to remove a small fragment of the central disk, and at this point, I noticed how adherent was the undersurface of the thecal sac.  In order to avoid a ventral CSF leak, I elected not to continue trying to remove it.  I already had an excellent central decompression so there would be no further central stenosis.  At this point, I obtained hemostasis using bipolar electrocautery, and then irrigated the wound copiously with normal saline.  I placed thrombin-soaked Gelfoam patty over the exposed thecal sac.  I then closed in a layered fashion with interrupted #1 Vicryl sutures for the deep fascia and then a layer of 2-0 Vicryl  sutures and a 3-0 Monocryl.  Steri-Strips and a dry dressing were applied.  The patient was ultimately extubated and transferred to the PACU without incident.  At the end of the case, all needle and sponge counts were correct.  There was no adverse intraoperative event.  My PA was instrumental in assisting me with retraction, visualization, wound closure and suctioning and retraction.     Dahlia Bailiff, MD     DDB/MEDQ  D:  04/20/2013  T:  04/21/2013  Job:  [id number]

## 2013-04-21 NOTE — Progress Notes (Signed)
    Subjective: Procedure(s) (LRB): L3-L4 DECOMPRESSION DISCECTOMY (1 LEVEL) (Right) 1 Day Post-Op  Patient reports pain as 2 on 0-10 scale.  Reports decreased leg pain reports incisional back pain   Positive void Negative bowel movement Positive flatus Negative chest pain or shortness of breath  Objective: Vital signs in last 24 hours: Temp:  [97.7 F (36.5 C)-99.1 F (37.3 C)] 98.8 F (37.1 C) (02/27 0829) Pulse Rate:  [46-95] 86 (02/27 0829) Resp:  [10-24] 20 (02/27 0829) BP: (106-142)/(69-90) 111/69 mmHg (02/27 0829) SpO2:  [94 %-100 %] 94 % (02/27 0829)  Intake/Output from previous day: 02/26 0701 - 02/27 0700 In: 2140 [P.O.:240; I.V.:1900] Out: -   Labs:  Recent Labs  04/18/13 0930  WBC 6.1  RBC 5.43  HCT 46.1  PLT 261   No results found for this basename: NA, K, CL, CO2, BUN, CREATININE, GLUCOSE, CALCIUM,  in the last 72 hours No results found for this basename: LABPT, INR,  in the last 72 hours  Physical Exam: Neurologically intact ABD soft Neurovascular intact Intact pulses distally Incision: dressing C/D/I and no drainage Compartment soft  Assessment/Plan: Patient stable  xrays n/a Continue mobilization with physical therapy Continue care  Patient doing well D/c to home today Will send on percocet for pain, robaxin for spasms, and 3 day supply of Ambien for sleep   F/u 2 weeks    Melina Schools, MD [place] [phone]

## 2013-04-21 NOTE — Progress Notes (Signed)
PT Cancellation Note  Patient Details Name: AXCEL HORSCH MRN: [id number] DOB: December 20, 1960   Cancelled Treatment:    Reason Eval/Treat Not Completed: PT screened, no needs identified, will sign off   Qiana Landgrebe 04/21/2013, 9:43 AM

## 2013-04-28 NOTE — Discharge Summary (Signed)
Patient ID: JADIEN LEHIGH MRN: [id number] DOB/AGE: 54/26/1962 54 y.o.  Admit date: 04/20/2013 Discharge date: 04/28/2013  Admission Diagnoses:  Active Problems:   Back pain   Discharge Diagnoses:  Active Problems:   Back pain  status post Procedure(s): L3-L4 DECOMPRESSION DISCECTOMY (1 LEVEL)  Past Medical History  Diagnosis Date  . Complication of anesthesia   . PONV (postoperative nausea and vomiting)   . Arthritis     Surgeries: Procedure(s): L3-L4 DECOMPRESSION DISCECTOMY (1 LEVEL) on 04/20/2013   Consultants:    Discharged Condition: Improved  Hospital Course: Christopher Rogers is an 53 y.o. male who was admitted 04/20/2013 for operative treatment of lumbar stenosis. Patient failed conservative treatments (please see the history and physical for the specifics) and had severe unremitting pain that affects sleep, daily activities and work/hobbies. After pre-op clearance, the patient was taken to the operating room on 04/20/2013 and underwent  Procedure(s): L3-L4 DECOMPRESSION DISCECTOMY (1 LEVEL).    Patient was given perioperative antibiotics:  Anti-infectives   Start     Dose/Rate Route Frequency Ordered Stop   04/20/13 2000  ceFAZolin (ANCEF) IVPB 1 g/50 mL premix     1 g 100 mL/hr over 30 Minutes Intravenous Every 8 hours 04/20/13 1629 04/21/13 0403   04/19/13 1419  ceFAZolin (ANCEF) IVPB 2 g/50 mL premix     2 g 100 mL/hr over 30 Minutes Intravenous 30 min pre-op 04/19/13 1419 04/20/13 1223       Patient was given sequential compression devices and early ambulation to prevent DVT.   Patient benefited maximally from hospital stay and there were no complications. At the time of discharge, the patient was urinating/moving their bowels without difficulty, tolerating a regular diet, pain is controlled with oral pain medications and they have been cleared by PT/OT.   Recent vital signs: No data found.    Recent laboratory studies: No results found for this  basename: WBC, HGB, HCT, PLT, NA, K, CL, CO2, BUN, CREATININE, GLUCOSE, PT, INR, CALCIUM, 2,  in the last 72 hours   Discharge Medications:     Medication List    STOP taking these medications       acetaminophen 500 MG tablet  Commonly known as:  TYLENOL     ibuprofen 200 MG tablet  Commonly known as:  ADVIL,MOTRIN     traMADol 50 MG tablet  Commonly known as:  ULTRAM      TAKE these medications       docusate sodium 100 MG capsule  Commonly known as:  COLACE  Take 1 capsule (100 mg total) by mouth 2 (two) times daily.     methocarbamol 500 MG tablet  Commonly known as:  ROBAXIN  Take 1 tablet (500 mg total) by mouth every 6 (six) hours as needed for muscle spasms.     ondansetron 4 MG disintegrating tablet  Commonly known as:  ZOFRAN ODT  Take 1 tablet (4 mg total) by mouth every 8 (eight) hours as needed for nausea or vomiting.     oxyCODONE-acetaminophen 10-325 MG per tablet  Commonly known as:  PERCOCET  Take 1 tablet by mouth every 6 (six) hours as needed for pain.     polyethylene glycol packet  Commonly known as:  MIRALAX / GLYCOLAX  Take 17 g by mouth daily.     zolpidem 6.25 MG CR tablet  Commonly known as:  AMBIEN CR  Take 1 tablet (6.25 mg total) by mouth at bedtime as needed for  sleep.        Diagnostic Studies: Dg Lumbar Spine 2-3 Views  04/18/2013   CLINICAL DATA:  L 3 L4 decompression  EXAM: LUMBAR SPINE - 2-3 VIEW  COMPARISON:  None  FINDINGS: Two views of lumbar spine submitted. No acute fracture or subluxation. Mild anterior spurring at L2-L3 and L3-L4 level. Minimal anterior spurring upper endplate of L5. Mild disc space flattening at L2-L3 L4-L5 and L5-S1 level. Facet degenerative changes L4 and L5 level.  IMPRESSION: No acute fracture or subluxation. Degenerative changes as described above.   Electronically Signed   By: Lahoma Crocker M.D.   On: 04/18/2013 10:11   Dg Lumbar Spine 1 View  04/20/2013   CLINICAL DATA:  Back surgery  EXAM: LUMBAR  SPINE - 1 VIEW  COMPARISON:  None.  FINDINGS: Intraoperative imaging would localization was performed. Needles are utilized to mark the L2 pedicle and L3 inferior articular facet.  IMPRESSION: Intraoperative marking at L2 and L3.   Electronically Signed   By: Maryclare Bean M.D.   On: 04/20/2013 14:06   Dg Lumbar Spine 1 View  04/20/2013   CLINICAL DATA:  L3-4 laminectomy.  EXAM: LUMBAR SPINE - 1 VIEW  COMPARISON:  04/18/2013  FINDINGS: A single lateral view labeled 1256 hr. This demonstrates a surgical device projecting posterior to the L3-4 interspace. Degenerative disc disease at L3-4 and L5-S1.  IMPRESSION: Intraoperative localization of L3-4.   Electronically Signed   By: Abigail Miyamoto M.D.   On: 04/20/2013 13:10        Discharge Orders   Future Orders Complete By Expires   Call MD / Call 911  As directed    Comments:     If you experience chest pain or shortness of breath, CALL 911 and be transported to the hospital emergency room.  If you develope a fever above 101 F, pus (white drainage) or increased drainage or redness at the wound, or calf pain, call your surgeon's office.   Constipation Prevention  As directed    Comments:     Drink plenty of fluids.  Prune juice may be helpful.  You may use a stool softener, such as Colace (over the counter) 100 mg twice a day.  Use MiraLax (over the counter) for constipation as needed.   Diet - low sodium heart healthy  As directed    Discharge instructions  As directed    Comments:     Ok to shower 5 days postop.  Do not apply any creams or ointments to incision.  Do not remove steri-strips.  Can use 4x4 gauze and tape for dressing changes.  No aggressive activity.  No bending, squatting or prolonged sitting.  Mostly be in reclined position or lying down.  Wear brace when ambulating.   Driving restrictions  As directed    Comments:     No driving until further notice.   Increase activity slowly as tolerated  As directed    Lifting restrictions  As  directed    Comments:     No lifting until further notice.      Follow-up Information   Schedule an appointment as soon as possible for a visit with Dahlia Bailiff, MD. (need return office visit 2 weeks postop)    Specialty:  Orthopedic Surgery   Contact information:   [address] [place] [address] [place] [address] [phone]       Discharge Plan:  discharge to home      Signed: Lanae Crumbly for  Dr. Melina Schools [facility] [phone] 04/28/2013, 11:29 AM

## 2013-05-01 NOTE — Discharge Summary (Signed)
Agree with above 

## 2014-11-16 ENCOUNTER — Other Ambulatory Visit: Payer: Self-pay | Admitting: Urology

## 2014-11-29 NOTE — Patient Instructions (Addendum)
YOUR PROCEDURE IS SCHEDULED ON :  12/07/14  REPORT TO [facility] MAIN ENTRANCE FOLLOW SIGNS TO EAST ELEVATOR - GO TO 3rd FLOOR CHECK IN AT 3 EAST NURSES STATION (SHORT STAY) AT:  8:00 AM  CALL THIS NUMBER IF YOU HAVE PROBLEMS THE MORNING OF SURGERY [phone]  REMEMBER:ONLY 1 PER PERSON MAY GO TO SHORT STAY WITH YOU TO GET READY THE MORNING OF YOUR SURGERY  DO NOT EAT FOOD OR DRINK LIQUIDS AFTER MIDNIGHT  TAKE THESE MEDICINES THE MORNING OF SURGERY: MAY TAKE OXYCODONE IF NEEDED FOR PAIN  YOU MAY NOT HAVE ANY METAL ON YOUR BODY INCLUDING HAIR PINS AND PIERCING'S. DO NOT WEAR JEWELRY, MAKEUP, LOTIONS, POWDERS OR PERFUMES. DO NOT WEAR NAIL POLISH. DO NOT SHAVE 48 HRS PRIOR TO SURGERY. MEN MAY SHAVE FACE AND NECK.  DO NOT [place]. [facility] IS NOT RESPONSIBLE FOR VALUABLES.  CONTACTS, DENTURES OR PARTIALS MAY NOT BE WORN TO SURGERY. LEAVE SUITCASE IN CAR. CAN BE BROUGHT TO ROOM AFTER SURGERY.  PATIENTS DISCHARGED THE DAY OF SURGERY WILL NOT BE ALLOWED TO DRIVE HOME.  PLEASE READ OVER THE FOLLOWING INSTRUCTION SHEETS _________________________________________________________________________________                                          [facility] - PREPARING FOR SURGERY  Before surgery, you can play an important role.  Because skin is not sterile, your skin needs to be as free of germs as possible.  You can reduce the number of germs on your skin by washing with CHG (chlorahexidine gluconate) soap before surgery.  CHG is an antiseptic cleaner which kills germs and bonds with the skin to continue killing germs even after washing. Please DO NOT use if you have an allergy to CHG or antibacterial soaps.  If your skin becomes reddened/irritated stop using the CHG and inform your nurse when you arrive at Short Stay. Do not shave (including legs and underarms) for at least 48 hours prior to the first CHG shower.  You may shave your face. Please  follow these instructions carefully:   1.  Shower with CHG Soap the night before surgery and the  morning of Surgery.   2.  If you choose to wash your hair, wash your hair first as usual with your  normal  Shampoo.   3.  After you shampoo, rinse your hair and body thoroughly to remove the  shampoo.                                         4.  Use CHG as you would any other liquid soap.  You can apply chg directly  to the skin and wash . Gently wash with scrungie or clean wascloth    5.  Apply the CHG Soap to your body ONLY FROM THE NECK DOWN.   Do not use on open                           Wound or open sores. Avoid contact with eyes, ears mouth and genitals (private parts).                        Genitals (private parts)  with your normal soap.              6.  Wash thoroughly, paying special attention to the area where your surgery  will be performed.   7.  Thoroughly rinse your body with warm water from the neck down.   8.  DO NOT shower/wash with your normal soap after using and rinsing off  the CHG Soap .                9.  Pat yourself dry with a clean towel.             10.  Wear clean night clothes to bed after shower             11.  Place clean sheets on your bed the night of your first shower and do not  sleep with pets.  Day of Surgery : Do not apply any lotions/deodorants the morning of surgery.  Please wear clean clothes to the hospital/surgery center.  FAILURE TO FOLLOW THESE INSTRUCTIONS MAY RESULT IN THE CANCELLATION OF YOUR SURGERY    PATIENT SIGNATURE_________________________________  ______________________________________________________________________

## 2014-11-30 ENCOUNTER — Encounter (HOSPITAL_COMMUNITY): Payer: Self-pay

## 2014-11-30 ENCOUNTER — Encounter (HOSPITAL_COMMUNITY)
Admission: RE | Admit: 2014-11-30 | Discharge: 2014-11-30 | Disposition: A | Discharge status: Home / Self Care | Payer: PRIVATE HEALTH INSURANCE | Source: Ambulatory Visit | Attending: Urology | Admitting: Urology

## 2014-11-30 DIAGNOSIS — N400 Enlarged prostate without lower urinary tract symptoms: Secondary | ICD-10-CM | POA: Diagnosis not present

## 2014-11-30 DIAGNOSIS — Z01818 Encounter for other preprocedural examination: Secondary | ICD-10-CM | POA: Diagnosis not present

## 2014-11-30 HISTORY — DX: Major depressive disorder, single episode, unspecified: F32.9

## 2014-11-30 HISTORY — DX: Attention-deficit hyperactivity disorder, predominantly inattentive type: F90.0

## 2014-11-30 HISTORY — DX: Insomnia, unspecified: G47.00

## 2014-11-30 HISTORY — DX: Depression, unspecified: F32.A

## 2014-11-30 LAB — CBC
HCT: 46.2 % (ref 39.0–52.0)
Hemoglobin: 15.8 g/dL (ref 13.0–17.0)
MCH: 29.5 pg (ref 26.0–34.0)
MCHC: 34.2 g/dL (ref 30.0–36.0)
MCV: 86.2 fL (ref 78.0–100.0)
PLATELETS: 265 10*3/uL (ref 150–400)
RBC: 5.36 MIL/uL (ref 4.22–5.81)
RDW: 13.2 % (ref 11.5–15.5)
WBC: 4.6 10*3/uL (ref 4.0–10.5)

## 2014-11-30 LAB — BASIC METABOLIC PANEL
Anion gap: 5 (ref 5–15)
BUN: 11 mg/dL (ref 6–20)
CHLORIDE: 104 mmol/L (ref 101–111)
CO2: 27 mmol/L (ref 22–32)
Calcium: 9.3 mg/dL (ref 8.9–10.3)
Creatinine, Ser: 1.01 mg/dL (ref 0.61–1.24)
Glucose, Bld: 112 mg/dL — ABNORMAL HIGH (ref 65–99)
POTASSIUM: 4.1 mmol/L (ref 3.5–5.1)
SODIUM: 136 mmol/L (ref 135–145)

## 2014-12-01 LAB — ABO/RH: ABO/RH(D): O POS

## 2014-12-06 NOTE — H&P (Signed)
Reason For Visit Lt flank pain & gross hematuria   Active Problems Problems  1. 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 Gross hematuria (R31.0) 3. Post-void dribbling (N39.43) 4. Urinary retention (R33.9)  History of Present Illness Christopher Rogers is a 54 yo male noted gross hematuria 8 days ago,with dysuria. Saw [facility] Nurse with dx stone, wRx flush wit water. Pt tried, but no help. On Wednsday, he was still bleeding, he went to [facility] in [place], with Dx UTI, Rx Amoxicillin/Clavulanic acid ( Augmentin) and Urogesic. He developed acute urinary retention Saturday AM with foley catheter. He admits to many year hx of weak urinary stream, dribbling, feeling of pvr. difficulty initiating urinary stream. IPSS=22, QOL= 5/5     He is now referred by Dr. Maxwell Caul (ER physician) for further evaluation of Lt flank pain & gross hematuria after being seen in the ER on 11/11/14. CT showed enlarged prostate with prostatic mass invading the bladder base, measuring 36 x 32 x 41 mm.  He currently has a foley in place.   Past Medical History Problems  1. History of arthritis (Z87.39) 2. History of depression (Z86.59)  Surgical History Problems  1. History of Back Surgery 2. History of Distal Reinsertion Of Ruptured Biceps Tendon 3. History of Shoulder Surgery 4. History of Varicose Vein Ligation  Current Meds 1. Diazepam 5 MG Oral Tablet;  Therapy: (Recorded:20Sep2016) to Recorded 2. Finasteride 5 MG Oral Tablet;  Therapy: (Recorded:20Sep2016) to Recorded 3. Lexapro 10 MG Oral Tablet;  Therapy: (Recorded:20Sep2016) to Recorded 4. Oxybutynin Chloride 5 MG Oral Tablet;  Therapy: (Recorded:20Sep2016) to Recorded 5. OxyCODONE HCl - 15 MG Oral Tablet;  Therapy: (Recorded:20Sep2016) to Recorded 6. Viagra 100 MG Oral Tablet;  Therapy: (Recorded:20Sep2016) to Recorded  Allergies Medication  1. No Known Drug Allergies  Family History Problems  1.  Family history of Deceased : Father 2. Family history of Alzheimer's disease (Z82.0) : Father 3. Family history of malignant neoplasm of prostate (B90.38) : Father 4. Family history of Hematuria, microscopic : Father  Social History Problems    Caffeine use (F15.90)   Married   Never a smoker   No alcohol use   Number of children   Occupation   Tobacco use (Z72.0)  Review of Systems Genitourinary, constitutional, skin, eye, otolaryngeal, hematologic/lymphatic, cardiovascular, pulmonary, endocrine, musculoskeletal, gastrointestinal, neurological and psychiatric system(s) were reviewed and pertinent findings if present are noted and are otherwise negative.  Genitourinary: hematuria.  Gastrointestinal: constipation.  Constitutional: feeling tired (fatigue).  Psychiatric: depression.    Vitals Vital Signs [Data Includes: Last 1 Day]  Recorded: 20Sep2016 11:29AM  Height: 5 ft 10 in Weight: 209 lb  BMI Calculated: 29.99 BSA Calculated: 2.13 Blood Pressure: 106 / 76 Heart Rate: 75  Physical Exam Constitutional: Well nourished and well developed . No acute distress. Foley catheter in place.  ENT:. The ears and nose are normal in appearance.  Neck: The appearance of the neck is normal and no neck mass is present.  Pulmonary: No respiratory distress and normal respiratory rhythm and effort.  Cardiovascular: Heart rate and rhythm are normal . No peripheral edema.  Abdomen: The abdomen is mildly obese. The abdomen is soft and nontender. No masses are palpated. No CVA tenderness. No hernias are palpable. No hepatosplenomegaly noted.  Rectal: Rectal exam demonstrates normal sphincter tone, no tenderness and no masses. Estimated prostate size is 4+. The prostate has no nodularity, is not indurated and is not tender. The left seminal vesicle is  nonpalpable, not tender and without masses. The right seminal vesicle is found to have a mass, but nonpalpable and not tender. The perineum is  normal on inspection.  Genitourinary: Examination of the penis demonstrates no discharge, no masses, no lesions and a normal meatus. The scrotum is without lesions. The right epididymis is palpably normal and non-tender. The left epididymis is palpably normal and non-tender. The right testis is non-tender and without masses. The left testis is non-tender and without masses.  Lymphatics: The femoral and inguinal nodes are not enlarged or tender.  Skin: Normal skin turgor, no visible rash and no visible skin lesions.  Neuro/Psych:. Mood and affect are appropriate.    Results/Data CT report reviewed. ( Wife will bring scan from [place] to be revoewed).  A peak flow rate of. 48cc/sec.  PVR: Ultrasound PVR 54 ml.    Procedure  Procedure: Cystoscopy  Chaperone Present: kim lewis.  Indication: Hematuria. Lower Urinary Tract Symptoms.  Informed Consent: Risks, benefits, and potential adverse events were discussed and informed consent was obtained from the patient.  Prep: The patient was prepped with betadine.  Anesthesia:. Local anesthesia was administered intraurethrally with 2% lidocaine jelly.  Antibiotic prophylaxis: Ciprofloxacin.  Procedure Note:  Urethral meatus:. No abnormalities.  Anterior urethra: No abnormalities.  Prostatic urethra:. The lateral and median prostatic lobes were enlarged. An enlarged intravesical median lobe was visualized.  Bladder: Visulization was obscured due to blood. The ureteral orifices were in the normal anatomic position bilaterally. Examination of the bladder demonstrated trabeculation, but no clot within the bladder and no diverticulum erythematous mucosa and edema, but no ulcer and no cellules. The patient tolerated the procedure well.  Complications: None.    Assessment Assessed  1. Gross hematuria (R31.0) 2. Urinary retention (R33.9) 3. 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 Post-void dribbling  (N39.43)  Cystoscopy large intravesical median lobe of the prostate, with chronic bladder outlet obstruction symptoms. He will need open supra transvesical prostatectomy. We have discussed the operative procedure and the fact that there might be cancer present microscopically in the tissue; however, he fact that he has a large prostatic growth simply reflects prostate growth-probably benign. The bleeding may come from large superficial veins that "pop" with bladder contraction, and will continue to bleed if he takes ASA.    He will need open transvesical prostatectomy as a surgical approach, even though the is able to void after cysto today. He ha reasonable flow rate and pvr today. He will continue Fomax, and Proscar. .   Plan Benign localized hyperplasia of prostate with urinary obstruction, Gross hematuria  1. Start: Tamsulosin HCl - 0.4 MG Oral Capsule; TAKE 1 CAPSULE Bedtime 2. Changed: From  Finasteride 5 MG Oral Tablet  To Finasteride 5 MG Oral Tablet TAKE  ONE TABLET BY MOUTH DAILY Benign localized hyperplasia of prostate with urinary obstruction, Post-void dribbling  3. Changed: From  Diazepam 5 MG Oral Tablet  To Diazepam 5 MG Oral Tablet Take 1  tablet at bedtime as needed to relax, and may take 1 tablet as needed to initiate urinary  stream Gross hematuria, Urinary retention  4. Cysto; Status:Complete;   Done: [id number] Urinary retention  5. Complex Uroflowmetry; Status:Complete;   Done: 20Sep2016 6. PVR U/S; Status:Complete;   Done: 20Sep2016 Unlinked  7. Stop: Oxybutynin Chloride 5 MG Oral Tablet  Flomax  Proscar  Valium 5mg  hs prn and also 5mg  prn to initiate urinary stream.   Schedule surgery. ( open transvesical prostatectomy).  Signatures Electronically signed by : Carolan Clines, M.D.; Nov 13 2014  2:17PM EST

## 2014-12-07 ENCOUNTER — Encounter (HOSPITAL_COMMUNITY): Payer: Self-pay | Admitting: *Deleted

## 2014-12-07 ENCOUNTER — Inpatient Hospital Stay (HOSPITAL_COMMUNITY): Payer: PRIVATE HEALTH INSURANCE | Admitting: Anesthesiology

## 2014-12-07 ENCOUNTER — Inpatient Hospital Stay (HOSPITAL_COMMUNITY)
Admission: RE | Admit: 2014-12-07 | Discharge: 2014-12-10 | DRG: 708 | Disposition: A | Discharge status: Home / Self Care | Payer: PRIVATE HEALTH INSURANCE | Source: Ambulatory Visit | Attending: Urology | Admitting: Urology

## 2014-12-07 ENCOUNTER — Encounter (HOSPITAL_COMMUNITY)
Admission: RE | Disposition: A | Discharge status: Home / Self Care | Payer: Self-pay | Source: Ambulatory Visit | Attending: Urology

## 2014-12-07 DIAGNOSIS — Z8042 Family history of malignant neoplasm of prostate: Secondary | ICD-10-CM | POA: Diagnosis not present

## 2014-12-07 DIAGNOSIS — N401 Enlarged prostate with lower urinary tract symptoms: Secondary | ICD-10-CM | POA: Diagnosis present

## 2014-12-07 DIAGNOSIS — Z79899 Other long term (current) drug therapy: Secondary | ICD-10-CM | POA: Diagnosis not present

## 2014-12-07 DIAGNOSIS — N400 Enlarged prostate without lower urinary tract symptoms: Secondary | ICD-10-CM | POA: Diagnosis present

## 2014-12-07 DIAGNOSIS — N3943 Post-void dribbling: Secondary | ICD-10-CM | POA: Diagnosis present

## 2014-12-07 DIAGNOSIS — Z01812 Encounter for preprocedural laboratory examination: Secondary | ICD-10-CM | POA: Diagnosis not present

## 2014-12-07 DIAGNOSIS — R310 Gross hematuria: Secondary | ICD-10-CM | POA: Diagnosis present

## 2014-12-07 DIAGNOSIS — N3289 Other specified disorders of bladder: Secondary | ICD-10-CM | POA: Diagnosis not present

## 2014-12-07 HISTORY — PX: PROSTATECTOMY: SHX69

## 2014-12-07 LAB — CBC
HCT: 39.5 % (ref 39.0–52.0)
HEMOGLOBIN: 13.3 g/dL (ref 13.0–17.0)
MCH: 29.4 pg (ref 26.0–34.0)
MCHC: 33.7 g/dL (ref 30.0–36.0)
MCV: 87.4 fL (ref 78.0–100.0)
Platelets: 207 10*3/uL (ref 150–400)
RBC: 4.52 MIL/uL (ref 4.22–5.81)
RDW: 13.2 % (ref 11.5–15.5)
WBC: 10 10*3/uL (ref 4.0–10.5)

## 2014-12-07 LAB — BASIC METABOLIC PANEL
Anion gap: 4 — ABNORMAL LOW (ref 5–15)
BUN: 12 mg/dL (ref 6–20)
CHLORIDE: 107 mmol/L (ref 101–111)
CO2: 28 mmol/L (ref 22–32)
CREATININE: 0.96 mg/dL (ref 0.61–1.24)
Calcium: 8.3 mg/dL — ABNORMAL LOW (ref 8.9–10.3)
GFR calc Af Amer: >60 mL/min (ref >60)
GFR calc non Af Amer: >60 mL/min (ref >60)
GLUCOSE: 141 mg/dL — AB (ref 65–99)
Potassium: 4.4 mmol/L (ref 3.5–5.1)
SODIUM: 139 mmol/L (ref 135–145)

## 2014-12-07 LAB — TYPE AND SCREEN
ABO/RH(D): O POS
ANTIBODY SCREEN: NEGATIVE

## 2014-12-07 SURGERY — PROSTATECTOMY, SUPRAPUBIC APPROACH
Anesthesia: General

## 2014-12-07 MED ORDER — MENTHOL 3 MG MT LOZG
1.0000 | LOZENGE | OROMUCOSAL | Status: DC | PRN
  Filled 2014-12-07 (×3): qty 9

## 2014-12-07 MED ORDER — OXYBUTYNIN CHLORIDE 5 MG PO TABS
5.0000 mg | ORAL_TABLET | Freq: Three times a day (TID) | ORAL | Status: DC | PRN
  Administered 2014-12-07 – 2014-12-09 (×5): 5 mg via ORAL
  Filled 2014-12-07 (×5): qty 1

## 2014-12-07 MED ORDER — CEFAZOLIN SODIUM 1-5 GM-% IV SOLN
1.0000 g | Freq: Three times a day (TID) | INTRAVENOUS | Status: AC
  Administered 2014-12-07 – 2014-12-08 (×2): 1 g via INTRAVENOUS
  Filled 2014-12-07 (×2): qty 50

## 2014-12-07 MED ORDER — ACETAMINOPHEN 10 MG/ML IV SOLN
INTRAVENOUS | Status: AC
  Filled 2014-12-07: qty 100

## 2014-12-07 MED ORDER — FENTANYL CITRATE (PF) 100 MCG/2ML IJ SOLN
INTRAMUSCULAR | Status: AC
  Filled 2014-12-07: qty 4

## 2014-12-07 MED ORDER — PROPOFOL 10 MG/ML IV BOLUS
INTRAVENOUS | Status: AC
  Filled 2014-12-07: qty 20

## 2014-12-07 MED ORDER — PROMETHAZINE HCL 25 MG/ML IJ SOLN: 6.2500 mg | INTRAMUSCULAR | Status: DC | PRN

## 2014-12-07 MED ORDER — DOCUSATE SODIUM 100 MG PO CAPS
100.0000 mg | ORAL_CAPSULE | Freq: Two times a day (BID) | ORAL | Status: DC
  Administered 2014-12-07 – 2014-12-10 (×7): 100 mg via ORAL
  Filled 2014-12-07 (×7): qty 1

## 2014-12-07 MED ORDER — ESCITALOPRAM OXALATE 10 MG PO TABS
10.0000 mg | ORAL_TABLET | Freq: Every day | ORAL | Status: DC
  Administered 2014-12-07 – 2014-12-09 (×3): 10 mg via ORAL
  Filled 2014-12-07 (×3): qty 1

## 2014-12-07 MED ORDER — BUPIVACAINE LIPOSOME 1.3 % IJ SUSP
20.0000 mL | Freq: Once | INTRAMUSCULAR | Status: AC
  Administered 2014-12-07: 20 mL
  Filled 2014-12-07: qty 20

## 2014-12-07 MED ORDER — HYDROMORPHONE HCL 1 MG/ML IJ SOLN
INTRAMUSCULAR | Status: AC
  Filled 2014-12-07: qty 1

## 2014-12-07 MED ORDER — BELLADONNA ALKALOIDS-OPIUM 16.2-60 MG RE SUPP
1.0000 | Freq: Four times a day (QID) | RECTAL | Status: DC | PRN
  Filled 2014-12-07: qty 1

## 2014-12-07 MED ORDER — HYDROCODONE-ACETAMINOPHEN 5-325 MG PO TABS
1.0000 | ORAL_TABLET | ORAL | Status: DC | PRN
  Administered 2014-12-09: 2 via ORAL
  Filled 2014-12-07: qty 2

## 2014-12-07 MED ORDER — MIDAZOLAM HCL 2 MG/2ML IJ SOLN
INTRAMUSCULAR | Status: AC
  Filled 2014-12-07: qty 4

## 2014-12-07 MED ORDER — PHENOL 1.4 % MT LIQD
1.0000 | OROMUCOSAL | Status: DC | PRN
  Filled 2014-12-07: qty 177

## 2014-12-07 MED ORDER — CEFAZOLIN SODIUM 1-5 GM-% IV SOLN
1.0000 g | Freq: Three times a day (TID) | INTRAVENOUS | Status: DC
  Filled 2014-12-07 (×2): qty 50

## 2014-12-07 MED ORDER — FENTANYL CITRATE (PF) 100 MCG/2ML IJ SOLN
INTRAMUSCULAR | Status: DC | PRN
  Administered 2014-12-07 (×15): 25 ug via INTRAVENOUS
  Administered 2014-12-07: 50 ug via INTRAVENOUS
  Administered 2014-12-07: 25 ug via INTRAVENOUS

## 2014-12-07 MED ORDER — LACTATED RINGERS IV SOLN
INTRAVENOUS | Status: DC
  Administered 2014-12-07: 1000 mL via INTRAVENOUS
  Administered 2014-12-07 (×4): via INTRAVENOUS

## 2014-12-07 MED ORDER — PROPOFOL 10 MG/ML IV BOLUS
INTRAVENOUS | Status: DC | PRN
  Administered 2014-12-07: 200 mg via INTRAVENOUS

## 2014-12-07 MED ORDER — CEFAZOLIN SODIUM-DEXTROSE 2-3 GM-% IV SOLR
2.0000 g | INTRAVENOUS | Status: AC
  Administered 2014-12-07: 2 g via INTRAVENOUS

## 2014-12-07 MED ORDER — CHLORHEXIDINE GLUCONATE 4 % EX LIQD: Freq: Once | CUTANEOUS | Status: DC

## 2014-12-07 MED ORDER — DEXAMETHASONE SODIUM PHOSPHATE 4 MG/ML IJ SOLN
INTRAMUSCULAR | Status: DC | PRN
  Administered 2014-12-07: 10 mg via INTRAVENOUS

## 2014-12-07 MED ORDER — ONDANSETRON HCL 4 MG/2ML IJ SOLN
INTRAMUSCULAR | Status: AC
  Filled 2014-12-07: qty 2

## 2014-12-07 MED ORDER — SODIUM CHLORIDE 0.9 % IJ SOLN
INTRAMUSCULAR | Status: AC
  Filled 2014-12-07: qty 20

## 2014-12-07 MED ORDER — ONDANSETRON HCL 4 MG/2ML IJ SOLN: 4.0000 mg | INTRAMUSCULAR | Status: DC | PRN

## 2014-12-07 MED ORDER — SENNA 8.6 MG PO TABS
1.0000 | ORAL_TABLET | Freq: Two times a day (BID) | ORAL | Status: DC
  Administered 2014-12-07 – 2014-12-10 (×7): 8.6 mg via ORAL
  Filled 2014-12-07 (×7): qty 1

## 2014-12-07 MED ORDER — HYDROCODONE-ACETAMINOPHEN 5-325 MG PO TABS: 1.0000 | ORAL_TABLET | ORAL | Status: DC | PRN

## 2014-12-07 MED ORDER — ACETAMINOPHEN 10 MG/ML IV SOLN
INTRAVENOUS | Status: DC | PRN
  Administered 2014-12-07: 1000 mg via INTRAVENOUS

## 2014-12-07 MED ORDER — SUCCINYLCHOLINE CHLORIDE 20 MG/ML IJ SOLN
INTRAMUSCULAR | Status: DC | PRN
  Administered 2014-12-07: 100 mg via INTRAVENOUS

## 2014-12-07 MED ORDER — OXYCODONE HCL 5 MG PO TABS: 5.0000 mg | ORAL_TABLET | Freq: Once | ORAL | Status: DC | PRN

## 2014-12-07 MED ORDER — HYDROMORPHONE HCL 1 MG/ML IJ SOLN
0.2500 mg | INTRAMUSCULAR | Status: DC | PRN
  Administered 2014-12-07 (×4): 0.5 mg via INTRAVENOUS

## 2014-12-07 MED ORDER — DEXTROSE-NACL 5-0.45 % IV SOLN
INTRAVENOUS | Status: DC
  Administered 2014-12-07 – 2014-12-09 (×4): via INTRAVENOUS

## 2014-12-07 MED ORDER — MIDAZOLAM HCL 5 MG/5ML IJ SOLN
INTRAMUSCULAR | Status: DC | PRN
  Administered 2014-12-07 (×2): 1 mg via INTRAVENOUS

## 2014-12-07 MED ORDER — BELLADONNA ALKALOIDS-OPIUM 16.2-60 MG RE SUPP
1.0000 | Freq: Four times a day (QID) | RECTAL | Status: DC | PRN
  Administered 2014-12-07 – 2014-12-08 (×2): 1 via RECTAL
  Filled 2014-12-07: qty 1

## 2014-12-07 MED ORDER — CEFAZOLIN SODIUM-DEXTROSE 2-3 GM-% IV SOLR
INTRAVENOUS | Status: AC
  Filled 2014-12-07: qty 50

## 2014-12-07 MED ORDER — KETOROLAC TROMETHAMINE 30 MG/ML IJ SOLN
INTRAMUSCULAR | Status: DC | PRN
  Administered 2014-12-07: 30 mg via INTRAVENOUS

## 2014-12-07 MED ORDER — OXYCODONE HCL 5 MG/5ML PO SOLN
5.0000 mg | Freq: Once | ORAL | Status: DC | PRN
  Filled 2014-12-07: qty 5

## 2014-12-07 MED ORDER — ZOLPIDEM TARTRATE 5 MG PO TABS
5.0000 mg | ORAL_TABLET | Freq: Every evening | ORAL | Status: DC | PRN
  Administered 2014-12-08 – 2014-12-09 (×3): 5 mg via ORAL
  Filled 2014-12-07 (×3): qty 1

## 2014-12-07 MED ORDER — SODIUM CHLORIDE 0.9 % IR SOLN
Status: DC | PRN
  Administered 2014-12-07: 3000 mL via INTRAVESICAL

## 2014-12-07 MED ORDER — LIDOCAINE HCL (CARDIAC) 20 MG/ML IV SOLN
INTRAVENOUS | Status: DC | PRN
  Administered 2014-12-07: 80 mg via INTRAVENOUS

## 2014-12-07 MED ORDER — LIDOCAINE HCL (CARDIAC) 20 MG/ML IV SOLN
INTRAVENOUS | Status: AC
  Filled 2014-12-07: qty 5

## 2014-12-07 MED ORDER — FENTANYL CITRATE (PF) 250 MCG/5ML IJ SOLN
INTRAMUSCULAR | Status: AC
  Filled 2014-12-07: qty 25

## 2014-12-07 MED ORDER — MORPHINE SULFATE (PF) 2 MG/ML IV SOLN
2.0000 mg | INTRAVENOUS | Status: DC | PRN
  Administered 2014-12-07: 2 mg via INTRAVENOUS
  Administered 2014-12-07 (×2): 4 mg via INTRAVENOUS
  Administered 2014-12-07: 2 mg via INTRAVENOUS
  Administered 2014-12-08 (×2): 4 mg via INTRAVENOUS
  Administered 2014-12-08 (×2): 2 mg via INTRAVENOUS
  Administered 2014-12-08: 4 mg via INTRAVENOUS
  Administered 2014-12-09: 2 mg via INTRAVENOUS
  Administered 2014-12-09: 4 mg via INTRAVENOUS
  Administered 2014-12-09 (×2): 2 mg via INTRAVENOUS
  Administered 2014-12-10: 4 mg via INTRAVENOUS
  Filled 2014-12-07 (×3): qty 2
  Filled 2014-12-07 (×3): qty 1
  Filled 2014-12-07: qty 2
  Filled 2014-12-07 (×2): qty 1
  Filled 2014-12-07 (×3): qty 2
  Filled 2014-12-07 (×2): qty 1
  Filled 2014-12-07: qty 2

## 2014-12-07 MED ORDER — ONDANSETRON HCL 4 MG/2ML IJ SOLN
INTRAMUSCULAR | Status: DC | PRN
  Administered 2014-12-07: 4 mg via INTRAVENOUS

## 2014-12-07 SURGICAL SUPPLY — 52 items
BAG URINE DRAINAGE (UROLOGICAL SUPPLIES) ×3 IMPLANT
BLADE EXTENDED COATED 6.5IN (ELECTRODE) ×5 IMPLANT
BLADE HEX COATED 2.75 (ELECTRODE) ×3 IMPLANT
BLADE SURG SZ12 CARB STEEL (BLADE) ×3 IMPLANT
CATH FOLEY 2WAY SLVR 30CC 22FR (CATHETERS) IMPLANT
CATH FOLEY 2WAY SLVR 30CC 24FR (CATHETERS) ×1 IMPLANT
CATH FOLEY 3WAY 30CC 24FR (CATHETERS) ×3
CATH HEMA 3WAY 30CC 22FR COUDE (CATHETERS) ×2 IMPLANT
CATH URTH STD 24FR FL 3W 2 (CATHETERS) ×1 IMPLANT
COVER SURGICAL LIGHT HANDLE (MISCELLANEOUS) ×3 IMPLANT
DRAIN CHANNEL 10F 3/8 F FF (DRAIN) ×2 IMPLANT
DRAPE LAPAROTOMY T 102X78X121 (DRAPES) ×3 IMPLANT
DRAPE UTILITY 15X26 (DRAPE) ×1 IMPLANT
DRAPE WARM FLUID 44X44 (DRAPE) ×3 IMPLANT
DRSG OPSITE POSTOP 4X10 (GAUZE/BANDAGES/DRESSINGS) ×2 IMPLANT
ELECT REM PT RETURN 9FT ADLT (ELECTROSURGICAL) ×3
ELECTRODE REM PT RTRN 9FT ADLT (ELECTROSURGICAL) ×1 IMPLANT
EVACUATOR SILICONE 100CC (DRAIN) ×2 IMPLANT
GAUZE SPONGE 4X4 16PLY XRAY LF (GAUZE/BANDAGES/DRESSINGS) ×3 IMPLANT
GLOVE BIOGEL M STRL SZ7.5 (GLOVE) ×1 IMPLANT
GOWN STRL REUS W/TWL XL LVL3 (GOWN DISPOSABLE) ×3 IMPLANT
GUIDEWIRE STR DUAL SENSOR (WIRE) ×1 IMPLANT
KIT BASIN OR (CUSTOM PROCEDURE TRAY) ×3 IMPLANT
NEEDLE HYPO 22GX1.5 SAFETY (NEEDLE) ×3 IMPLANT
NS IRRIG 1000ML POUR BTL (IV SOLUTION) ×6 IMPLANT
PACK GENERAL/GYN (CUSTOM PROCEDURE TRAY) ×3 IMPLANT
PLUG CATH AND CAP STER (CATHETERS) ×6 IMPLANT
SCRUB PCMX 4 OZ (MISCELLANEOUS) ×3 IMPLANT
SET IRRIG Y TYPE TUR BLADDER L (SET/KITS/TRAYS/PACK) IMPLANT
SPONGE LAP 18X18 X RAY DECT (DISPOSABLE) ×3 IMPLANT
SPONGE LAP 4X18 X RAY DECT (DISPOSABLE) ×3 IMPLANT
STAPLER SKIN PROX WIDE 3.9 (STAPLE) IMPLANT
STAPLER VISISTAT 35W (STAPLE) IMPLANT
SURGIFLO W/THROMBIN 8M KIT (HEMOSTASIS) ×2 IMPLANT
SURGILUBE 3G PEEL PACK STRL (MISCELLANEOUS) ×12 IMPLANT
SUT ETHILON 3 0 PS 1 (SUTURE) ×4 IMPLANT
SUT PDS AB 1 CTX 36 (SUTURE) ×6 IMPLANT
SUT SILK 0 (SUTURE) ×3
SUT SILK 0 30XBRD TIE 6 (SUTURE) ×1 IMPLANT
SUT SILK 2 0 (SUTURE)
SUT SILK 2-0 30XBRD TIE 12 (SUTURE) IMPLANT
SUT VIC AB 0 UR5 27 (SUTURE) ×12 IMPLANT
SUT VIC AB 2-0 UR5 27 (SUTURE) IMPLANT
SUT VIC AB 2-0 UR6 27 (SUTURE) IMPLANT
SUT VIC AB 3-0 SH 27 (SUTURE) ×9
SUT VIC AB 3-0 SH 27XBRD (SUTURE) IMPLANT
SYR 30ML LL (SYRINGE) ×3 IMPLANT
SYR CONTROL 10ML LL (SYRINGE) ×5 IMPLANT
TOWEL OR 17X26 10 PK STRL BLUE (TOWEL DISPOSABLE) ×8 IMPLANT
TOWEL OR NON WOVEN STRL DISP B (DISPOSABLE) ×3 IMPLANT
WATER STERILE IRR 1500ML POUR (IV SOLUTION) ×3 IMPLANT
YANKAUER SUCT BULB TIP NO VENT (SUCTIONS) ×2 IMPLANT

## 2014-12-07 NOTE — Anesthesia Postprocedure Evaluation (Signed)
  Anesthesia Post-op Note  Patient: Christopher Rogers  Procedure(s) Performed: Procedure(s) (LRB): OPEN SUPRAPUBIC PROSTATECTOMY  (N/A)  Patient Location: PACU  Anesthesia Type: General  Level of Consciousness: awake and alert   Airway and Oxygen Therapy: Patient Spontanous Breathing  Post-op Pain: mild  Post-op Assessment: Post-op Vital signs reviewed, Patient's Cardiovascular Status Stable, Respiratory Function Stable, Patent Airway and No signs of Nausea or vomiting  Last Vitals:  Filed Vitals:   12/07/14 1330  BP: 144/76  Pulse: 73  Temp: 37.1 C  Resp: 11    Post-op Vital Signs: stable   Complications: No apparent anesthesia complications

## 2014-12-07 NOTE — Op Note (Signed)
Preoperative diagnosis:  1. BPH  Postoperative diagnosis: 1. Same  Procedure(s): 1. Open simple prostatectomy  Surgeon: Dr. Katrine Coho Resident: Jearld Adjutant, MD  Anesthesia: General   Complications: none  EBL: 500 ml's  Specimens: Prostate adenoma  Disposition of specimens: for pathology   Intraoperative findings: median lobe projected into the bladder. 40 g gland removed.   Indication: 54 y.o. male with LUTS and BPH needed operative treatment after discussion of risks and benefits.   Description of procedure:  The patient was brought to the OR and general anesthesia was induced. Perioperative antibiotics were provided. The patient was placed in the supine position and then was prepped and draped in the usual standard sterile fashion. A pre-incision timeout was performed.   A 22 [place] 3-way hematuria catheter was placed per urethra with good return of urine; balloon was inflated to 30 cc with sterile saline. A midline incision was made from the umbilicus to the symphysis pubis coagulating all bleeders as the where encountered. The rectus fascia was divided the length of the incision and the space of Retsius was developed bluntly to expose the bladder and prostate.   The bladder was then entered using the bovie in a midline vertical fashion with 0-vicryl stay sutures. The bladder mucosa was bovied to the prostate proper avoiding the ureteral orifices. Sharp dissection was performed to allow a blunt finger dissection of the prostatic adenoma from the base to the apex of the prostate. The distal urethra was pinched off and the remaining prostatic attachments were bluntly divided to remove the adenoma.   We then placed surgiflow in the prostatic bed, placed the foley balloon in the prostatic bed and then packed with a sponge. Pressure was applied for 3 minutes to allow for prostatic capsule contraction. The specimen was inspected for completeness of resection and sent for  permanent pathologic review. The sponges were then removed with good hemostasis.   The bladder was closed in a two layer running fashion with 3-0 vicryl suture. A 67mm Jackson-Pratt drain was placed through a separate stab incision in the LLQ. The operative site was inspected for possible bleeders, which were clipped if found. The fascia was then closed in a running fashion with #0-looped PDS suture. The subcutaneous tissue was irrigated with saline and the skin was closed with staples. The wound was dressed.  The JP drain was secured and the Foley was tapped with gentle traction. The Patient was then awaken from anesthesia without complications and transferred to the Recovery Room.

## 2014-12-07 NOTE — Interval H&P Note (Signed)
History and Physical Interval Note:  12/07/2014 9:51 AM  Christopher Rogers  has presented today for surgery, with the diagnosis of [place]   The various methods of treatment have been discussed with the patient and family. After consideration of risks, benefits and other options for treatment, the patient has consented to  Procedure(s): OPEN SUPRAPUBIC PROSTATECTOMY  (N/A) as a surgical intervention .  The patient's history has been reviewed, patient examined, no change in status, stable for surgery.  I have reviewed the patient's chart and labs.  Questions were answered to the patient's satisfaction.     Eline Geng I Azriella Mattia

## 2014-12-07 NOTE — Transfer of Care (Signed)
Immediate Anesthesia Transfer of Care Note  Patient: Christopher Rogers  Procedure(s) Performed: Procedure(s) (LRB): OPEN SUPRAPUBIC PROSTATECTOMY  (N/A)  Patient Location: PACU  Anesthesia Type: General  Level of Consciousness: awake, sedated, patient cooperative and responds to stimulation  Airway & Oxygen Therapy: Patient Spontanous Breathing and Patient connected to face mask oxygen  Post-op Assessment: Report given to PACU RN, Post -op Vital signs reviewed and stable and Patient moving all extremities - bloody urine - MD aware   Post vital signs: Reviewed and stable  Complications: No apparent anesthesia complications

## 2014-12-07 NOTE — Anesthesia Preprocedure Evaluation (Addendum)
Anesthesia Evaluation  Patient identified by MRN, date of birth, ID band Patient awake    Reviewed: Allergy & Precautions, H&P , NPO status , Patient's Chart, lab work & pertinent test results  History of Anesthesia Complications Negative for: history of anesthetic complications  Airway Mallampati: I  TM Distance: >3 FB Neck ROM: full    Dental  (+) Poor Dentition, Loose,    Pulmonary neg pulmonary ROS,    Pulmonary exam normal breath sounds clear to auscultation       Cardiovascular negative cardio ROS Normal cardiovascular exam Rhythm:regular Rate:Normal     Neuro/Psych Depression negative neurological ROS     GI/Hepatic negative GI ROS, Neg liver ROS,   Endo/Other  negative endocrine ROS  Renal/GU negative Renal ROS     Musculoskeletal  (+) Arthritis ,   Abdominal   Peds  Hematology negative hematology ROS (+)   Anesthesia Other Findings Poor dentition, does dip tobacco  Reproductive/Obstetrics                            Anesthesia Physical Anesthesia Plan  ASA: II  Anesthesia Plan: General   Post-op Pain Management:    Induction: Intravenous  Airway Management Planned: Oral ETT  Additional Equipment:   Intra-op Plan:   Post-operative Plan: Extubation in OR  Informed Consent: I have reviewed the patients History and Physical, chart, labs and discussed the procedure including the risks, benefits and alternatives for the proposed anesthesia with the patient or authorized representative who has indicated his/her understanding and acceptance.   Dental Advisory Given  Plan Discussed with: Anesthesiologist, CRNA and Surgeon  Anesthesia Plan Comments:         Anesthesia Quick Evaluation

## 2014-12-07 NOTE — Anesthesia Procedure Notes (Signed)
Procedure Name: Intubation Date/Time: 12/07/2014 10:09 AM Performed by: Justice Rocher Pre-anesthesia Checklist: Patient identified, Emergency Drugs available, Suction available and Patient being monitored Patient Re-evaluated:Patient Re-evaluated prior to inductionOxygen Delivery Method: Circle System Utilized Preoxygenation: Pre-oxygenation with 100% oxygen Intubation Type: IV induction Ventilation: Mask ventilation without difficulty Laryngoscope Size: Mac and 4 Grade View: Grade II Tube type: Oral Tube size: 7.5 mm Number of attempts: 1 Airway Equipment and Method: Stylet and Oral airway Placement Confirmation: ETT inserted through vocal cords under direct vision,  positive ETCO2 and breath sounds checked- equal and bilateral Secured at: 23 cm Tube secured with: Tape Dental Injury: Teeth and Oropharynx as per pre-operative assessment  Comments: Noted pre- op front incisor large chipped along edge and center of tooth, several loose - explained dental risks in preop - agreed. After intubation - teeth as preop - no complications with intubation

## 2014-12-08 LAB — BASIC METABOLIC PANEL
Anion gap: 6 (ref 5–15)
BUN: 12 mg/dL (ref 6–20)
CALCIUM: 8.4 mg/dL — AB (ref 8.9–10.3)
CO2: 28 mmol/L (ref 22–32)
CREATININE: 0.75 mg/dL (ref 0.61–1.24)
Chloride: 105 mmol/L (ref 101–111)
Glucose, Bld: 147 mg/dL — ABNORMAL HIGH (ref 65–99)
Potassium: 4.3 mmol/L (ref 3.5–5.1)
SODIUM: 139 mmol/L (ref 135–145)

## 2014-12-08 LAB — CBC
HCT: 36.5 % — ABNORMAL LOW (ref 39.0–52.0)
Hemoglobin: 12.2 g/dL — ABNORMAL LOW (ref 13.0–17.0)
MCH: 28.6 pg (ref 26.0–34.0)
MCHC: 33.4 g/dL (ref 30.0–36.0)
MCV: 85.5 fL (ref 78.0–100.0)
PLATELETS: 191 10*3/uL (ref 150–400)
RBC: 4.27 MIL/uL (ref 4.22–5.81)
RDW: 13 % (ref 11.5–15.5)
WBC: 11.1 10*3/uL — AB (ref 4.0–10.5)

## 2014-12-08 MED ORDER — HYDROMORPHONE HCL 1 MG/ML IJ SOLN
1.0000 mg | INTRAMUSCULAR | Status: DC | PRN
  Administered 2014-12-08 – 2014-12-10 (×10): 1 mg via INTRAVENOUS
  Filled 2014-12-08 (×10): qty 1

## 2014-12-08 MED ORDER — VITAMINS A & D EX OINT
TOPICAL_OINTMENT | CUTANEOUS | Status: AC
  Administered 2014-12-08: 11:00:00
  Filled 2014-12-08: qty 5

## 2014-12-08 MED ORDER — BELLADONNA ALKALOIDS-OPIUM 16.2-60 MG RE SUPP
1.0000 | Freq: Three times a day (TID) | RECTAL | Status: DC | PRN
  Administered 2014-12-08 – 2014-12-10 (×3): 1 via RECTAL
  Filled 2014-12-08 (×4): qty 1

## 2014-12-08 NOTE — Progress Notes (Signed)
Labs look good  Mobile and advancing diet Having bladder spasms/penile pain Has had ditropan and B and O i spoke with nurse and added dilaudid Will continue CBI for one more day

## 2014-12-09 LAB — BASIC METABOLIC PANEL
ANION GAP: 3 — AB (ref 5–15)
BUN: 12 mg/dL (ref 6–20)
CHLORIDE: 106 mmol/L (ref 101–111)
CO2: 28 mmol/L (ref 22–32)
Calcium: 8.2 mg/dL — ABNORMAL LOW (ref 8.9–10.3)
Creatinine, Ser: 0.84 mg/dL (ref 0.61–1.24)
GFR calc Af Amer: >60 mL/min (ref >60)
GLUCOSE: 124 mg/dL — AB (ref 65–99)
POTASSIUM: 4.1 mmol/L (ref 3.5–5.1)
Sodium: 137 mmol/L (ref 135–145)

## 2014-12-09 LAB — CBC
HEMATOCRIT: 33.3 % — AB (ref 39.0–52.0)
HEMOGLOBIN: 11.3 g/dL — AB (ref 13.0–17.0)
MCH: 29.7 pg (ref 26.0–34.0)
MCHC: 33.9 g/dL (ref 30.0–36.0)
MCV: 87.4 fL (ref 78.0–100.0)
Platelets: 179 10*3/uL (ref 150–400)
RBC: 3.81 MIL/uL — ABNORMAL LOW (ref 4.22–5.81)
RDW: 13.4 % (ref 11.5–15.5)
WBC: 7.6 10*3/uL (ref 4.0–10.5)

## 2014-12-09 MED ORDER — HYOSCYAMINE SULFATE 0.125 MG SL SUBL
0.1250 mg | SUBLINGUAL_TABLET | Freq: Four times a day (QID) | SUBLINGUAL | Status: DC | PRN
  Administered 2014-12-09 – 2014-12-10 (×2): 0.125 mg via SUBLINGUAL
  Filled 2014-12-09 (×5): qty 1

## 2014-12-09 MED ORDER — OXYBUTYNIN CHLORIDE 5 MG PO TABS
5.0000 mg | ORAL_TABLET | Freq: Four times a day (QID) | ORAL | Status: DC
  Administered 2014-12-09 – 2014-12-10 (×5): 5 mg via ORAL
  Filled 2014-12-09 (×5): qty 1

## 2014-12-09 MED ORDER — VITAMINS A & D EX OINT
TOPICAL_OINTMENT | CUTANEOUS | Status: AC
  Administered 2014-12-09: 06:00:00
  Filled 2014-12-09: qty 5

## 2014-12-09 NOTE — Progress Notes (Signed)
Looks good  Labs and vital OK Ditropan works the best and no side effects Still bothered spasms and penile pain but improved PLAN Stop CBI DC drain with minimal output Increase ditropan to qid Add levsin Stop iv Home tomorrow

## 2014-12-09 NOTE — Plan of Care (Signed)
Problem: Phase I Progression Outcomes Goal: Voiding-avoid urinary catheter unless indicated Outcome: Not Applicable Date Met:  64/33/29 Pt has foley catheter r/t procedure. To dc to home with foley.

## 2014-12-10 ENCOUNTER — Encounter (HOSPITAL_COMMUNITY): Payer: Self-pay | Admitting: Urology

## 2014-12-10 LAB — CBC
HCT: 36 % — ABNORMAL LOW (ref 39.0–52.0)
Hemoglobin: 11.8 g/dL — ABNORMAL LOW (ref 13.0–17.0)
MCH: 28.2 pg (ref 26.0–34.0)
MCHC: 32.8 g/dL (ref 30.0–36.0)
MCV: 86.1 fL (ref 78.0–100.0)
PLATELETS: 199 10*3/uL (ref 150–400)
RBC: 4.18 MIL/uL — AB (ref 4.22–5.81)
RDW: 13.1 % (ref 11.5–15.5)
WBC: 6.2 10*3/uL (ref 4.0–10.5)

## 2014-12-10 LAB — BASIC METABOLIC PANEL
ANION GAP: 6 (ref 5–15)
BUN: 11 mg/dL (ref 6–20)
CALCIUM: 8.5 mg/dL — AB (ref 8.9–10.3)
CO2: 27 mmol/L (ref 22–32)
CREATININE: 0.88 mg/dL (ref 0.61–1.24)
Chloride: 104 mmol/L (ref 101–111)
GFR calc non Af Amer: >60 mL/min (ref >60)
Glucose, Bld: 111 mg/dL — ABNORMAL HIGH (ref 65–99)
POTASSIUM: 3.7 mmol/L (ref 3.5–5.1)
Sodium: 137 mmol/L (ref 135–145)

## 2014-12-10 MED ORDER — HYOSCYAMINE SULFATE 0.125 MG PO TABS: 0.1250 mg | ORAL_TABLET | ORAL | Status: DC | PRN

## 2014-12-10 MED ORDER — OXYCODONE HCL 5 MG PO TABS
5.0000 mg | ORAL_TABLET | ORAL | Status: DC | PRN
Start: 2014-12-10 — End: 2015-09-13

## 2014-12-10 MED ORDER — SULFAMETHOXAZOLE-TRIMETHOPRIM 800-160 MG PO TABS: 1.0000 | ORAL_TABLET | Freq: Two times a day (BID) | ORAL | Status: DC

## 2014-12-10 MED ORDER — OXYBUTYNIN CHLORIDE 5 MG PO TABS: 5.0000 mg | ORAL_TABLET | Freq: Four times a day (QID) | ORAL | Status: DC

## 2014-12-10 NOTE — Discharge Instructions (Signed)
Home Care Instructions:  Activity Rest for the remainder of the day. Do not drive or operate equipment today. You may resume normal activities in one to two days unless specified otherwise by Dr. Janice Norrie.  Meals Drink plenty of liquids and eat light foods such as gelatin or soup this evening. You may return to a normal meal plan tomorrow.  Return to work You may return to work in one to two days unless specified otherwise by Dr. Janice Norrie.  Special Instructions Call [phone] if you have any of the following symptoms:  Persistent or heavy bleeding  Bleeding which continues after the first few urinations  Large blood clots that are difficult to pass  Urine stream diminishes or stops completely  Fever equal to or higher than 101 degrees F  Cloudy urine with a strong, foul odor  Severe pain  Females should always wipe from front to back. You may feel some burning pain when you urinate. This should disappear with time. Applying moist heat to the lower abdomen or a hot tub bath may help relieve the pain.  Follow-up In one week.  My offfice will call you for the appointment.  Additional Instructions You may see some blood in your urine (especially males) the first few times you urinate. You may resume sexual activity in one to two days.

## 2014-12-10 NOTE — Progress Notes (Signed)
Urology Progress Note  3 Days Post-Op   Subjective: Pod # 3 urine clearing. No clots. JPn m8inimal. P: D/c JP and D/c today.    No acute urologic events overnight. Ambulation:   positive Flatus:    positive Bowel movement  positive  Pain: some relief. Bladder spasms  Objective:  Blood pressure 111/74, pulse 81, temperature 97.9 F (36.6 C), temperature source Oral, resp. rate 17, height 5\' 10"  (1.778 m), weight 93.441 kg (206 lb), SpO2 100 %.  Physical Exam:  General:  No acute distress, awake Extremities: extremities normal, atraumatic, no cyanosis or edema Genitourinary:  normal Foley:  yes    I/O last 3 completed shifts: In: 4257.5 [P.O.:720; I.V.:3537.5] Out: 4580 [Urine:4500; Drains:80]  Recent Labs     12/09/14  0428  12/10/14  0527  HGB  11.3*  11.8*  WBC  7.6  6.2  PLT  179  199    Recent Labs     12/09/14  0428  12/10/14  0527  NA  137  137  K  4.1  3.7  CL  106  104  CO2  28  27  BUN  12  11  CREATININE  0.84  0.88  CALCIUM  8.2*  8.5*  GFRNONAA  >60  >60  GFRAA  >60  >60     No results for input(s): INR, APTT in the last 72 hours.  Invalid input(s): PT   Invalid input(s): ABG  Assessment/Plan:  Catheter not removed.n Needs to stay for 10 days

## 2014-12-10 NOTE — Discharge Summary (Signed)
Date of admission: 12/07/2014  Date of discharge: 12/10/2014  Admission diagnosis: BPH and LUTS  Discharge diagnosis: same  Secondary diagnoses: none  History and Physical: For full details, please see admission history and physical. Briefly, Christopher Rogers is a 54 y.o. year old patient with BPH and LUTS here for open suprapubic simple prostatectomy.   Hospital Course: The patient underwent the above procedure. They tolerated surgery well and were transferred to the floor postoperatively. Their diet was slowly advanced and at the time of discharge they were tolerating a regular diet. The JP drain had minimal output throughout the course and was removed prior to discharge. At the time of discharge their pain was controlled with PO medications and they were ambulating without difficulty. Their foley was left in place with a plan for removal at 10 days along with their staples. There was a small right inguinal swelling that was tender to palpation but decreased in size and tenderness throughout the course. They were discharged home on post op day 3.    Laboratory values:   Recent Labs  12/08/14 0534 12/09/14 0428 12/10/14 0527  HGB 12.2* 11.3* 11.8*  HCT 36.5* 33.3* 36.0*    Recent Labs  12/09/14 0428 12/10/14 0527  CREATININE 0.84 0.88    Disposition: Home  Discharge instruction: The patient was instructed to be ambulatory but told to refrain from heavy lifting, strenuous activity, or driving.   Discharge medications:    Medication List    STOP taking these medications        docusate sodium 100 MG capsule  Commonly known as:  COLACE      TAKE these medications        escitalopram 10 MG tablet  Commonly known as:  LEXAPRO  Take 10 mg by mouth at bedtime.     finasteride 5 MG tablet  Commonly known as:  PROSCAR  Take 5 mg by mouth daily.     hyoscyamine 0.125 MG tablet  Commonly known as:  LEVSIN  Take 1 tablet (0.125 mg total) by mouth every 4 (four) hours  as needed.     methocarbamol 500 MG tablet  Commonly known as:  ROBAXIN  Take 1 tablet (500 mg total) by mouth every 6 (six) hours as needed for muscle spasms.     ondansetron 4 MG disintegrating tablet  Commonly known as:  ZOFRAN ODT  Take 1 tablet (4 mg total) by mouth every 8 (eight) hours as needed for nausea or vomiting.     oxybutynin 5 MG tablet  Commonly known as:  DITROPAN  Take 1 tablet (5 mg total) by mouth 4 (four) times daily.     oxyCODONE 5 MG immediate release tablet  Commonly known as:  ROXICODONE  Take 1 tablet (5 mg total) by mouth every 4 (four) hours as needed for severe pain.     oxyCODONE-acetaminophen 10-325 MG tablet  Commonly known as:  PERCOCET  Take 1 tablet by mouth every 6 (six) hours as needed for pain.     polyethylene glycol packet  Commonly known as:  MIRALAX / GLYCOLAX  Take 17 g by mouth daily.     sulfamethoxazole-trimethoprim 800-160 MG tablet  Commonly known as:  BACTRIM DS,SEPTRA DS  Take 1 tablet by mouth 2 (two) times daily. Start taking this the day before your catheter comes out.     sulfamethoxazole-trimethoprim 800-160 MG tablet  Commonly known as:  BACTRIM DS,SEPTRA DS  Take 1 tablet by mouth 2 (two) times daily.  tamsulosin 0.4 MG Caps capsule  Commonly known as:  FLOMAX  Take 0.4 mg by mouth daily.     zolpidem 6.25 MG CR tablet  Commonly known as:  AMBIEN CR  Take 1 tablet (6.25 mg total) by mouth at bedtime as needed for sleep.        Followup:  Follow-up Information    Call Ailene Rud, MD.   Specialty:  Urology   Contact information:   [address] [place] [address] [phone]

## 2015-09-12 ENCOUNTER — Encounter (HOSPITAL_COMMUNITY): Payer: Self-pay

## 2015-09-12 ENCOUNTER — Observation Stay (HOSPITAL_COMMUNITY)
Admission: AD | Admit: 2015-09-12 | Discharge: 2015-09-13 | Disposition: A | Discharge status: Home / Self Care | Payer: PRIVATE HEALTH INSURANCE | Attending: Psychiatry | Admitting: Psychiatry

## 2015-09-12 DIAGNOSIS — F900 Attention-deficit hyperactivity disorder, predominantly inattentive type: Secondary | ICD-10-CM | POA: Diagnosis not present

## 2015-09-12 DIAGNOSIS — F1010 Alcohol abuse, uncomplicated: Secondary | ICD-10-CM | POA: Diagnosis not present

## 2015-09-12 DIAGNOSIS — G4700 Insomnia, unspecified: Secondary | ICD-10-CM | POA: Diagnosis not present

## 2015-09-12 DIAGNOSIS — F1910 Other psychoactive substance abuse, uncomplicated: Secondary | ICD-10-CM | POA: Diagnosis present

## 2015-09-12 DIAGNOSIS — M1990 Unspecified osteoarthritis, unspecified site: Secondary | ICD-10-CM | POA: Insufficient documentation

## 2015-09-12 DIAGNOSIS — F332 Major depressive disorder, recurrent severe without psychotic features: Principal | ICD-10-CM | POA: Diagnosis present

## 2015-09-12 DIAGNOSIS — F1120 Opioid dependence, uncomplicated: Secondary | ICD-10-CM | POA: Diagnosis not present

## 2015-09-12 DIAGNOSIS — N400 Enlarged prostate without lower urinary tract symptoms: Secondary | ICD-10-CM | POA: Diagnosis not present

## 2015-09-12 DIAGNOSIS — F329 Major depressive disorder, single episode, unspecified: Secondary | ICD-10-CM | POA: Diagnosis present

## 2015-09-12 LAB — RAPID URINE DRUG SCREEN, HOSP PERFORMED
AMPHETAMINES: NOT DETECTED
BARBITURATES: NOT DETECTED
BENZODIAZEPINES: NOT DETECTED
COCAINE: NOT DETECTED
Opiates: NOT DETECTED
Tetrahydrocannabinol: NOT DETECTED

## 2015-09-12 MED ORDER — CLONIDINE HCL 0.1 MG PO TABS: 0.1000 mg | ORAL_TABLET | ORAL | Status: DC

## 2015-09-12 MED ORDER — VITAMIN B-1 100 MG PO TABS: 100.0000 mg | ORAL_TABLET | Freq: Every day | ORAL | Status: DC

## 2015-09-12 MED ORDER — ONDANSETRON 4 MG PO TBDP: 4.0000 mg | ORAL_TABLET | Freq: Four times a day (QID) | ORAL | Status: DC | PRN

## 2015-09-12 MED ORDER — LOPERAMIDE HCL 2 MG PO CAPS: 2.0000 mg | ORAL_CAPSULE | ORAL | Status: DC | PRN

## 2015-09-12 MED ORDER — CLONIDINE HCL 0.1 MG PO TABS: 0.1000 mg | ORAL_TABLET | Freq: Every day | ORAL | Status: DC

## 2015-09-12 MED ORDER — THIAMINE HCL 100 MG/ML IJ SOLN
100.0000 mg | Freq: Once | INTRAMUSCULAR | Status: DC
Start: 2015-09-12 — End: 2015-09-12
  Filled 2015-09-12: qty 2

## 2015-09-12 MED ORDER — HYDROXYZINE HCL 25 MG PO TABS
25.0000 mg | ORAL_TABLET | Freq: Four times a day (QID) | ORAL | Status: DC | PRN
  Administered 2015-09-12: 25 mg via ORAL
  Filled 2015-09-12: qty 1

## 2015-09-12 MED ORDER — MAGNESIUM HYDROXIDE 400 MG/5ML PO SUSP: 30.0000 mL | Freq: Every day | ORAL | Status: DC | PRN

## 2015-09-12 MED ORDER — METHOCARBAMOL 500 MG PO TABS: 500.0000 mg | ORAL_TABLET | Freq: Three times a day (TID) | ORAL | Status: DC | PRN

## 2015-09-12 MED ORDER — ACETAMINOPHEN 325 MG PO TABS: 650.0000 mg | ORAL_TABLET | Freq: Four times a day (QID) | ORAL | Status: DC | PRN

## 2015-09-12 MED ORDER — NAPROXEN 500 MG PO TABS: 500.0000 mg | ORAL_TABLET | Freq: Two times a day (BID) | ORAL | Status: DC | PRN

## 2015-09-12 MED ORDER — TRAZODONE HCL 50 MG PO TABS: 50.0000 mg | ORAL_TABLET | Freq: Every evening | ORAL | Status: DC | PRN

## 2015-09-12 MED ORDER — CLONIDINE HCL 0.1 MG PO TABS
0.1000 mg | ORAL_TABLET | Freq: Four times a day (QID) | ORAL | Status: DC
  Administered 2015-09-12 – 2015-09-13 (×4): 0.1 mg via ORAL
  Filled 2015-09-12 (×4): qty 1

## 2015-09-12 MED ORDER — ESCITALOPRAM OXALATE 10 MG PO TABS
10.0000 mg | ORAL_TABLET | Freq: Every day | ORAL | Status: DC
Start: 2015-09-12 — End: 2015-09-13
  Administered 2015-09-12: 10 mg via ORAL
  Filled 2015-09-12: qty 1

## 2015-09-12 MED ORDER — LORAZEPAM 1 MG PO TABS: 1.0000 mg | ORAL_TABLET | Freq: Four times a day (QID) | ORAL | Status: DC | PRN

## 2015-09-12 MED ORDER — ALUM & MAG HYDROXIDE-SIMETH 200-200-20 MG/5ML PO SUSP: 30.0000 mL | ORAL | Status: DC | PRN

## 2015-09-12 MED ORDER — ADULT MULTIVITAMIN W/MINERALS CH
1.0000 | ORAL_TABLET | Freq: Every day | ORAL | Status: DC
  Administered 2015-09-12 – 2015-09-13 (×2): 1 via ORAL
  Filled 2015-09-12 (×2): qty 1

## 2015-09-12 MED ORDER — DICYCLOMINE HCL 20 MG PO TABS: 20.0000 mg | ORAL_TABLET | Freq: Four times a day (QID) | ORAL | Status: DC | PRN

## 2015-09-12 NOTE — BH Assessment (Signed)
Tele Assessment Note   Christopher Rogers is an 55 y.o. male, Caucasian Male who presents to [facility] as walk in with complaints of worsening depression and substance abuse. Patient states that his primary concern is to seek help for substance abuse and medication management for mental health. Patient states that he currently stays by self with no noted support systems. Patient denies history of psychotic symptoms. Patient states that he has had recent decline in sleep with 4 hours per night.  Patient denies current SI, but reports SI thoughts prior to walk-in and states that he has no plans at presents and "I don't know what I'll do when I go home" when asked about plans of SI. Patient denies HI and history of HI as well as AVH. Patient acknowledges substance abuse history with opiates and alcohol. Pt first use of opiates Krata at 10 mg per day was x 1 years ago with use daily and last use on 09/12/15 via oral ingestion. Patient first use of alcohol is unknown and frequency is random with unknown amounts and last use on 09-12-15 for unknown amount [During assessment there was no large presence of alcohol smell]. Patient states that he has not been seen inpatient for psychiatric care, but has been seen outpatient with [facility] where he was prescribed 10 mg Lexapro daily and most recent on 09-09-15 Latuda [unknown dosage], and states that he discontinued Latuda because it increased and had side effects of depression.   Patient is dressed in normal street attire and is alert and oriented x4. Patient speech was within normal limits and motor behavior appeared normal. Patient thought process is coherent. Patient does not appear to be responding to internal stimuli. Patient was cooperative throughout the assessment and states that he  is agreeable to inpatient psychiatric treatment.   Diagnosis: Substance induced Mood Disorder; Opiate Use Disorder, Severe  Past Medical History:  Past Medical History   Diagnosis Date  . Arthritis   . Insomnia   . Depression   . ADD (attention deficit hyperactivity disorder, inattentive type)     Past Surgical History  Procedure Laterality Date  . Biceps tendon repair Left   . Shoulder arthroscopy Left     clavical repair x2  . Varicose vein surgery Bilateral   . Wisdom tooth extraction    . Colonoscopy    . Lumbar laminectomy/decompression microdiscectomy Right 04/20/2013    Procedure: L3-L4 DECOMPRESSION DISCECTOMY (1 LEVEL);  Surgeon: Melina Schools, MD;  Location: [facility];  Service: Orthopedics;  Laterality: Right;  . Eye surgery    . Prostatectomy N/A 12/07/2014    Procedure: OPEN SUPRAPUBIC PROSTATECTOMY ;  Surgeon: Carolan Clines, MD;  Location: [facility];  Service: Urology;  Laterality: N/A;    Family History: No family history on file.  Social History:  reports that he has never smoked. His smokeless tobacco use includes Chew. He reports that he does not drink alcohol or use illicit drugs.  Additional Social History:  Alcohol / Drug Use Pain Medications: SEE MAR Prescriptions: SEE MAR Over the Counter: SEE MAR History of alcohol / drug use?: Yes Longest period of sobriety (when/how long): unspecified Negative Consequences of Use: Financial, Legal, Personal relationships Withdrawal Symptoms: Patient aware of relationship between substance abuse and physical/medical complications Substance #1 Name of Substance 1: alcohol 1 - Age of First Use: unknown 1 - Amount (size/oz): various 1 - Frequency: random 1 - Duration: years 1 - Last Use / Amount: 09/12/15 Substance #2 Name of Substance  2: Opiates/ Krata 2 - Age of First Use: 54 2 - Amount (size/oz): 20 grams 2 - Frequency: daily 2 - Duration: x 1 year 2 - Last Use / Amount: 09/12/15  CIWA:   COWS:    PATIENT STRENGTHS: (choose at least two) Active sense of humor Average or above average intelligence Capable of independent living Communication skills  Allergies: No Known  Allergies  Home Medications:  (Not in a hospital admission)  OB/GYN Status:  No LMP for male patient.  General Assessment Data Location of Assessment: [facility] Assessment Services [facility] Assessment: In system Is this a Tele or Face-to-Face Assessment?: Face-to-Face Is this an Initial Assessment or a Re-assessment for this encounter?: Initial Assessment Marital status: Married [place] name: n/a Is patient pregnant?: No Pregnancy Status: No Living Arrangements: Alone Can pt return to current living arrangement?: Yes Admission Status: Voluntary Is patient capable of signing voluntary admission?: Yes Referral Source: Self/Family/Friend Insurance type: Administrator, arts Exam ([facility]) Medical Exam completed: Yes  Crisis Care Plan Living Arrangements: Alone Name of Psychiatrist: Laurena Bering Name of Therapist: none  Education Status Is patient currently in school?: No Current Grade: n/a Highest grade of school patient has completed: some college Name of school: unspecified Contact person: Tammy Casto  Risk to self with the past 6 months Suicidal Ideation: Yes-Currently Present (yes, acknowledges prior, but denies at moment present) Has patient been a risk to self within the past 6 months prior to admission? : No Suicidal Intent: No Has patient had any suicidal intent within the past 6 months prior to admission? : No Is patient at risk for suicide?: No Suicidal Plan?: No Has patient had any suicidal plan within the past 6 months prior to admission? : No Access to Means: No What has been your use of drugs/alcohol within the last 12 months?: yes Previous Attempts/Gestures: No How many times?: 0 Other Self Harm Risks: none noted Triggers for Past Attempts: None known Intentional Self Injurious Behavior: None Family Suicide History: No Recent stressful life event(s): Conflict (Comment), Turmoil (Comment) Persecutory voices/beliefs?: No Depression:  Yes Depression Symptoms: Despondent, Insomnia, Tearfulness, Isolating, Fatigue, Guilt, Loss of interest in usual pleasures Substance abuse history and/or treatment for substance abuse?: Yes Suicide prevention information given to non-admitted patients: Not applicable  Risk to Others within the past 6 months Homicidal Ideation: No Does patient have any lifetime risk of violence toward others beyond the six months prior to admission? : No Thoughts of Harm to Others: No Current Homicidal Intent: No Current Homicidal Plan: No Access to Homicidal Means: No Identified Victim: none History of harm to others?: No Assessment of Violence: None Noted Violent Behavior Description: none noted Does patient have access to weapons?: No Criminal Charges Pending?: No Does patient have a court date: No Is patient on probation?: No  Psychosis Hallucinations: None noted Delusions: None noted  Mental Status Report Appearance/Hygiene: Unremarkable Eye Contact: Fair Motor Activity: Unremarkable Speech: Unremarkable Level of Consciousness: Alert Mood: Depressed, Anxious, Ashamed/humiliated, Despair Affect: Anxious, Depressed Anxiety Level: Panic Attacks Panic attack frequency:  (random) Most recent panic attack: 09/10/15 Thought Processes: Coherent, Relevant Judgement: Impaired Orientation: Person, Place, Time, Situation, Appropriate for developmental age Obsessive Compulsive Thoughts/Behaviors: None  Cognitive Functioning Concentration: Decreased Memory: Recent Intact, Remote Intact IQ: Average Insight: Fair Impulse Control: Poor Appetite: Poor Weight Loss: 0 Weight Gain: 0 Sleep: Decreased Total Hours of Sleep: 4 Vegetative Symptoms: None  ADLScreening [facility] Assessment Services) Patient's cognitive ability adequate to safely complete daily activities?:  Yes Patient able to express need for assistance with ADLs?: Yes Independently performs ADLs?: Yes (appropriate for developmental  age)  Prior Inpatient Therapy Prior Inpatient Therapy: No Prior Therapy Dates: n/a Prior Therapy Facilty/Provider(s): n/a Reason for Treatment: n/a  Prior Outpatient Therapy Prior Outpatient Therapy: Yes Prior Therapy Dates: 2017 Prior Therapy Facilty/Provider(s): [facility] Reason for Treatment: S.A., bipolar Does patient have an ACCT team?: No Does patient have Intensive In-House Services?  : No Does patient have [facility] services? : No Does patient have P4CC services?: No  ADL Screening (condition at time of admission) Patient's cognitive ability adequate to safely complete daily activities?: Yes Is the patient deaf or have difficulty hearing?: No Does the patient have difficulty seeing, even when wearing glasses/contacts?: No Does the patient have difficulty concentrating, remembering, or making decisions?: No Patient able to express need for assistance with ADLs?: Yes Does the patient have difficulty dressing or bathing?: No Independently performs ADLs?: Yes (appropriate for developmental age) Does the patient have difficulty walking or climbing stairs?: No Weakness of Legs: None Weakness of Arms/Hands: None  Home Assistive Devices/Equipment Home Assistive Devices/Equipment: None    Abuse/Neglect Assessment (Assessment to be complete while patient is alone) Physical Abuse: Denies Verbal Abuse: Denies Sexual Abuse: Denies Exploitation of patient/patient's resources: Denies Self-Neglect: Denies Values / Beliefs Cultural Requests During Hospitalization: None Spiritual Requests During Hospitalization: None   Advance Directives (For Healthcare) Does patient have an advance directive?: No Would patient like information on creating an advanced directive?: No - patient declined information    Additional Information 1:1 In Past 12 Months?: No CIRT Risk: No Elopement Risk: No Does patient have medical clearance?: Yes     Disposition: Per [place], DNP  meets criteria for OBS admission. Disposition Initial Assessment Completed for this Encounter: Yes Disposition of Patient: Other dispositions (OBS criteria) Other disposition(s): Other (Comment)  Kristeen Mans 09/12/2015 11:31 AM

## 2015-09-12 NOTE — BHH Counselor (Signed)
Pt was declined at [facility] due to insurance coverage.   Redmond Pulling, MA OBS Counselor

## 2015-09-12 NOTE — BHH Counselor (Signed)
This Probation officer spoke with pt in regards to what types of treatment he was seeking. Pt communicated that he would like to go into a 28 day residential treatment facility to assist him with maintaining sobriety and developing coping skills for his stressors. This Probation officer suggested three facilities to this patient in regards to treatment ([facility] , [facility], and [facility]). Pt signed ROI for this writer proving consent to release his information to both facilities. This Probation officer made telephone calls to both facilities to check bed availability and was informed by [facility] that they currently don't have any residential beds available until the beginning of next week; but instructed this writer to send over referral so patient could be put on a waiting list. This Probation officer then called [facility] in [place], and left a message for return call. This Probation officer also made communication with [facility] to find out about admission requirements and to make sure that patients insurance was accepted there. This Probation officer was instructed to submit patient's information along with patient's insurance information attached.  This Probation officer communicated all of this information back to the patient. Pt was in agreement with his information being submitted to [facility], and [facility].  Redmond Pulling, MA OBS Counselor

## 2015-09-12 NOTE — Progress Notes (Signed)
Admission Note: Admittted patient, a 55 y/o male, to [facility] OBS Unit.  On arrival patient calm and cooperative but tearful.  He reports that his wife left him 4 days ago because of his substance abuse.  He states that he promised her he would stop using but relapsed.  He reports that the main substance he uses is "Kratom" whic he says is an opiate.  He also reports history of abusing Vyvanse, Ritalin, Adderrall, Tramadol, Hydrocosone, Focalin, and Daytrim.  He reports alcohol abuse in the past but he said his last alcoholic drink was 4 months ago.  He states that he needs help with withdrawal symptoms and would like to get into a 28 day program.  He denies suicidal ideation at present time but says "I don't know what would happen if I went home to that empty house."  He contracts for safety here.  He denies homicidal ideation and AVH.  Patient and belongings checked for contraband with none found. Belongings including wallet, clothing, cell phone, shoes, credit cards, money and ID locked up by Security. Skin assessment done with no skin abnormalities noted.  ( Witnesssed by Kieth Brightly, RN) Patient oriented to OBS Unit; emotional support given; encouraged him to seek assistance for needs/concerns.

## 2015-09-12 NOTE — Progress Notes (Signed)
Per Lilla Shook, DNP meets criteria for OBS.  Dakin Madani K. Nash Shearer, LPC-A, [facility]  Counselor 09/12/2015 11:43 AM

## 2015-09-13 DIAGNOSIS — F332 Major depressive disorder, recurrent severe without psychotic features: Secondary | ICD-10-CM | POA: Diagnosis not present

## 2015-09-13 DIAGNOSIS — F1910 Other psychoactive substance abuse, uncomplicated: Secondary | ICD-10-CM | POA: Diagnosis not present

## 2015-09-13 LAB — COMPREHENSIVE METABOLIC PANEL
ALBUMIN: 4.5 g/dL (ref 3.5–5.0)
ALT: 33 U/L (ref 17–63)
ANION GAP: 5 (ref 5–15)
AST: 28 U/L (ref 15–41)
Alkaline Phosphatase: 128 U/L — ABNORMAL HIGH (ref 38–126)
BILIRUBIN TOTAL: 0.9 mg/dL (ref 0.3–1.2)
BUN: 14 mg/dL (ref 6–20)
CHLORIDE: 108 mmol/L (ref 101–111)
CO2: 27 mmol/L (ref 22–32)
Calcium: 9.1 mg/dL (ref 8.9–10.3)
Creatinine, Ser: 0.99 mg/dL (ref 0.61–1.24)
GFR calc Af Amer: >60 mL/min (ref >60)
Glucose, Bld: 109 mg/dL — ABNORMAL HIGH (ref 65–99)
POTASSIUM: 4 mmol/L (ref 3.5–5.1)
Sodium: 140 mmol/L (ref 135–145)
TOTAL PROTEIN: 6.9 g/dL (ref 6.5–8.1)

## 2015-09-13 LAB — CBC
HEMATOCRIT: 47.9 % (ref 39.0–52.0)
Hemoglobin: 16.1 g/dL (ref 13.0–17.0)
MCH: 28.9 pg (ref 26.0–34.0)
MCHC: 33.6 g/dL (ref 30.0–36.0)
MCV: 85.8 fL (ref 78.0–100.0)
PLATELETS: 260 10*3/uL (ref 150–400)
RBC: 5.58 MIL/uL (ref 4.22–5.81)
RDW: 13.3 % (ref 11.5–15.5)
WBC: 6.2 10*3/uL (ref 4.0–10.5)

## 2015-09-13 LAB — TSH: TSH: 1.451 u[IU]/mL (ref 0.350–4.500)

## 2015-09-13 LAB — ETHANOL

## 2015-09-13 NOTE — Progress Notes (Signed)
Patient ID: Christopher Rogers, male   DOB: [birth year], 55 y.o.   MRN: [id number]  D: Patient denies any SI at present. Wanting a 28 day program but observation staff not able to get him into any long term placement as of yet but calls placed and information sent out on him. We strongly encouraged him to follow-up with [facility] program and even [facility] if he is interested. Did discuss with him that [facility] generally requires a deposit but varies depending on insurance. Several handouts and phone numbers provided. Discussed if gets into a crisis situation to go to closest ED, call 911, or crisis hotline. A: Given information, belongings, and he will continue his current medication. R: Car in parking lot so was able to discharge right away.

## 2015-09-13 NOTE — Discharge Summary (Signed)
Physician Discharge Summary Note  Patient:  Christopher Rogers is an 55 y.o., male MRN:  [id number] DOB:  September 24, 1960 Patient phone:  [phone] (home)  Patient address:   [address]  [place] [address],  Total Time spent with patient: 20 minutes  Date of Admission:  09/12/2015 Date of Discharge: 09/13/2015  Reason for Admission: PER HPI-Subjective:Christopher Rogers is a 55 y.o. male patient admitted as a walk-in stating concerns of severe depression and opiate abuse, snorting pills. Pt seen and chart reviewed. Pt is alert/oriented x4, calm, cooperative, and appropriate to situation. Pt denies suicidal/homicidal ideation and psychosis and does not appear to be responding to internal stimuli. However, pt presents as very depressed and states he feels hopeless about his drug use, asking for long-term rehab. See plan below.HPI: I have reviewed and concur with HPI elements below, modified as follows: Christopher Rogers is an 55 y.o. male, Caucasian Male who presents to [facility] as walk in with complaints of worsening depression and substance abuse. Patient states that his primary concern is to seek help for substance abuse and medication management for mental health. Patient states that he currently stays by self with no noted support systems. Patient denies history of psychotic symptoms. Patient states that he has had recent decline in sleep with 4 hours per night. Patient denies current SI, but reports SI thoughts prior to walk-in and states that he has no plans at presents and "I don't know what I'll do when I go home" when asked about plans of SI. Patient denies HI and history of HI as well as AVH. Patient acknowledges substance abuse history with opiates and alcohol. Pt first use of opiates Krata at 10 mg per day was x 1 years ago with use daily and last use on 09/12/15 via oral ingestion. Patient first use of alcohol is unknown and frequency is random with unknown amounts and last use on  09-12-15 for unknown amount [During assessment there was no large presence of alcohol smell]. Patient states that he has not been seen inpatient for psychiatric care, but has been seen outpatient with [facility] where he was prescribed 10 mg Lexapro daily and most recent on 09-09-15 Latuda [unknown dosage], and states that he discontinued Latuda because it increased and had side effects of depression.  Pt presented today as a walk-in. He does not present with any acute medical complaints which would warrant an ED medical clearance. Pt can therefore be cleared at this time with evening-draw labs to have information for evaluation/management and referrals to rehab facilities which will request these results as well. Pt denies any subjective/objective medical concerns  Principal Problem: Polysubstance abuse Discharge Diagnoses: Patient Active Problem List   Diagnosis Date Noted  . Polysubstance abuse [F19.10] 09/12/2015  . MDD (major depressive disorder), recurrent severe, without psychosis ([place]) [F33.2] 09/12/2015  . BPH (benign prostatic hyperplasia) [N40.0] 12/07/2014  . Back pain [M54.9] 04/20/2013    Past Psychiatric History: See Above  Past Medical History:  Past Medical History  Diagnosis Date  . Arthritis   . Insomnia   . Depression   . ADD (attention deficit hyperactivity disorder, inattentive type)     Past Surgical History  Procedure Laterality Date  . Biceps tendon repair Left   . Shoulder arthroscopy Left     clavical repair x2  . Varicose vein surgery Bilateral   . Wisdom tooth extraction    . Colonoscopy    . Lumbar laminectomy/decompression microdiscectomy Right 04/20/2013  Procedure: L3-L4 DECOMPRESSION DISCECTOMY (1 LEVEL);  Surgeon: Melina Schools, MD;  Location: [place];  Service: Orthopedics;  Laterality: Right;  . Eye surgery    . Prostatectomy N/A 12/07/2014    Procedure: OPEN SUPRAPUBIC PROSTATECTOMY ;  Surgeon: Carolan Clines, MD;  Location: [facility];  Service: Urology;  Laterality: N/A;   Family History: History reviewed. No pertinent family history. Family Psychiatric  History: Unknown Social History:  History  Alcohol Use No     History  Drug Use No    Social History   Social History  . Marital Status: Married    Spouse Name: N/A  . Number of Children: N/A  . Years of Education: N/A   Social History Main Topics  . Smoking status: Never Smoker   . Smokeless tobacco: Current User    Types: Chew  . Alcohol Use: No  . Drug Use: No  . Sexual Activity: Not Asked     Comment: 1 can per month   Other Topics Concern  . None   Social History Narrative    Hospital Course:  EDIL RUFUS was admitted for Polysubstance abuse  and crisis management.  Pt was treated discharged with the medications listed below under Medication List.  Medical problems were identified and treated as needed.  Home medications were restarted as appropriate.  Improvement was monitored by observation and Christopher Rogers 's daily report of symptom reduction.  Emotional and mental status was monitored by daily self-inventory reports completed by Christopher Rogers and clinical staff.         Christopher Rogers was evaluated by the treatment team for stability and plans for continued recovery upon discharge. Christopher Rogers 's motivation was an integral factor for scheduling further treatment. Employment, transportation, bed availability, health status, family support, and any pending legal issues were also considered during hospital stay. Pt was offered further treatment options upon discharge including but not limited to Residential, Intensive Outpatient, and Outpatient treatment.  Christopher Rogers will follow up with the services as listed below under Follow Up Information.     Upon completion of this admission the patient was both mentally and medically stable for discharge denying suicidal/homicidal ideation, auditory/visual/tactile  hallucinations, delusional thoughts and paranoia.    Christopher Rogers responded well to treatment with clonidine/ COWS protocol, lexapro without adverse effects. Pt demonstrated improvement without reported or observed adverse effects to the point of stability appropriate for outpatient management. Pertinent labs include: CMP: glucose and alkaline phosphatase for which outpatient follow-up is necessary for lab recheck as mentioned below. Reviewed CBC, CMP, BAL, and UDS; all unremarkable aside from noted exceptions.   Physical Findings: AIMS: Facial and Oral Movements Muscles of Facial Expression: None, normal Lips and Perioral Area: None, normal Jaw: None, normal Tongue: None, normal,Extremity Movements Upper (arms, wrists, hands, fingers): None, normal Lower (legs, knees, ankles, toes): None, normal, Trunk Movements Neck, shoulders, hips: None, normal, Overall Severity Severity of abnormal movements (highest score from questions above): None, normal Incapacitation due to abnormal movements: None, normal Patient's awareness of abnormal movements (rate only patient's report): No Awareness, Dental Status Current problems with teeth and/or dentures?: No Does patient usually wear dentures?: No  CIWA:  CIWA-Ar Total: 2 COWS:  COWS Total Score: 6  Musculoskeletal: Strength & Muscle Tone: within normal limits Gait & Station: normal Patient leans: N/A  Psychiatric Specialty Exam: Physical Exam  Nursing note and vitals reviewed. Constitutional: He appears well-developed.  HENT:  Mouth/Throat: Oropharynx is  clear and moist.  Neurological: He is alert.  Skin: Skin is dry.  Psychiatric: He has a normal mood and affect. His behavior is normal.    Review of Systems  Psychiatric/Behavioral: Negative for suicidal ideas and hallucinations. Depression: stable.  All other systems reviewed and are negative.   Blood pressure 112/73, pulse 84, temperature 98.9 F (37.2 C), temperature source  Oral, resp. rate 18, height 5\' 10"  (1.778 m), weight 95.255 kg (210 lb), SpO2 98 %.Body mass index is 30.13 kg/(m^2).  General Appearance: Casual  Eye Contact:  Fair  Speech:  Clear and Coherent  Volume:  Normal  Mood:  Anxious and Depressed  Affect:  Congruent  Thought Process:  Coherent  Orientation:  Full (Time, Place, and Person)  Thought Content:  Hallucinations: None  Suicidal Thoughts:  No  Homicidal Thoughts:  No  Memory:  Immediate;   Good Remote;   Good  Judgement:  Good  Insight:  Good  Psychomotor Activity:  Normal  Concentration:  Concentration: Fair  Recall:  [place] of Knowledge:  Good  Language:  Good  Akathisia:  No  Handed:  Right  AIMS (if indicated):     Assets:  Desire for Improvement Financial Resources/Insurance Social Support  ADL's:  Intact  Cognition:  WNL  Sleep:        Have you used any form of tobacco in the last 30 days? (Cigarettes, Smokeless Tobacco, Cigars, and/or Pipes): Yes  Has this patient used any form of tobacco in the last 30 days? (Cigarettes, Smokeless Tobacco, Cigars, and/or Pipes) Yes, No  Blood Alcohol level:  Lab Results  Component Value Date   ETH <5 [id number]    Metabolic Disorder Labs:  No results found for: HGBA1C, MPG No results found for: PROLACTIN No results found for: CHOL, TRIG, HDL, CHOLHDL, VLDL, LDLCALC  See Psychiatric Specialty Exam and Suicide Risk Assessment completed by Attending Physician prior to discharge.  Discharge destination:  Home  Is patient on multiple antipsychotic therapies at discharge:  No   Has Patient had three or more failed trials of antipsychotic monotherapy by history:  No  Recommended Plan for Multiple Antipsychotic Therapies: NA     Medication List    STOP taking these medications        b complex vitamins tablet     cholecalciferol 1000 units tablet  Commonly known as:  VITAMIN D     hyoscyamine 0.125 MG tablet  Commonly known as:  LEVSIN     ibuprofen 200  MG tablet  Commonly known as:  ADVIL,MOTRIN     methocarbamol 500 MG tablet  Commonly known as:  ROBAXIN     omega-3 acid ethyl esters 1 g capsule  Commonly known as:  LOVAZA     ondansetron 4 MG disintegrating tablet  Commonly known as:  ZOFRAN ODT     oxybutynin 5 MG tablet  Commonly known as:  DITROPAN     oxyCODONE 5 MG immediate release tablet  Commonly known as:  ROXICODONE     oxyCODONE-acetaminophen 10-325 MG tablet  Commonly known as:  PERCOCET     polyethylene glycol packet  Commonly known as:  MIRALAX / GLYCOLAX     sulfamethoxazole-trimethoprim 800-160 MG tablet  Commonly known as:  BACTRIM DS,SEPTRA DS     zolpidem 6.25 MG CR tablet  Commonly known as:  AMBIEN CR      TAKE these medications      Indication   escitalopram 10 MG tablet  Commonly  known as:  LEXAPRO  Take 10 mg by mouth at bedtime.        Follow-up Information    Follow up with [facility] in [facility]. Schedule an appointment as soon as possible for a visit today.   Why:  Pt to make a face to face interview.   Contact information:   [phone]       I agree with current treatment plan on 09/13/2015, Patient seen face-to-face for psychiatric evaluation follow-up, chart reviewed and case discussed with the MD Dwyane Dee. Reviewed the information documented and agree with the treatment plan.  Follow-up recommendations:  Activity:  as tolerated Diet:  heart healty  Comments:  Take all medications as prescribed. Keep all follow-up appointments as scheduled.  Do not consume alcohol or use illegal drugs while on prescription medications. Report any adverse effects from your medications to your primary care provider promptly.  In the event of recurrent symptoms or worsening symptoms, call 911, a crisis hotline, or go to the nearest emergency department for evaluation.   Signed: Derrill Center, NP 09/13/2015, 10:02 AM

## 2015-09-13 NOTE — H&P (Signed)
[facility] OBS UNIT H&P  Patient Identification: Christopher Rogers MRN:  [id number] Principal Diagnosis: Polysubstance abuse Diagnosis:   Patient Active Problem List   Diagnosis Date Noted  . Polysubstance abuse [F19.10] 09/12/2015    Priority: High  . MDD (major depressive disorder), recurrent severe, without psychosis ([place]) [F33.2] 09/12/2015    Priority: High  . BPH (benign prostatic hyperplasia) [N40.0] 12/07/2014  . Back pain [M54.9] 04/20/2013    Total Time spent with patient: 30 minutes  Subjective:   Christopher Rogers is a 55 y.o. male patient admitted as a walk-in stating concerns of severe depression and opiate abuse, snorting pills. Pt seen and chart reviewed. Pt is alert/oriented x4, calm, cooperative, and appropriate to situation. Pt denies suicidal/homicidal ideation and psychosis and does not appear to be responding to internal stimuli. However, pt presents as very depressed and states he feels hopeless about his drug use, asking for long-term rehab. See plan below.  HPI:  I have reviewed and concur with HPI elements below, modified as follows:  Christopher Rogers is an 55 y.o. male, Caucasian Male who presents to [facility] as walk in with complaints of worsening depression and substance abuse. Patient states that his primary concern is to seek help for substance abuse and medication management for mental health. Patient states that he currently stays by self with no noted support systems. Patient denies history of psychotic symptoms. Patient states that he has had recent decline in sleep with 4 hours per night.  Patient denies current SI, but reports SI thoughts prior to walk-in and states that he has no plans at presents and "I don't know what I'll do when I go home" when asked about plans of SI. Patient denies HI and history of HI as well as AVH. Patient acknowledges substance abuse history with opiates and alcohol. Pt first use of opiates Krata at 10 mg per day was x 1 years ago  with use daily and last use on 09/12/15 via oral ingestion. Patient first use of alcohol is unknown and frequency is random with unknown amounts and last use on 09-12-15 for unknown amount [During assessment there was no large presence of alcohol smell]. Patient states that he has not been seen inpatient for psychiatric care, but has been seen outpatient with [facility] where he was prescribed 10 mg Lexapro daily and most recent on 09-09-15 Latuda [unknown dosage], and states that he discontinued Latuda because it increased and had side effects of depression.   Pt presented today as a walk-in. He does not present with any acute medical complaints which would warrant an ED medical clearance. Pt can therefore be cleared at this time with evening-draw labs to have information for evaluation/management and referrals to rehab facilities which will request these results as well. Pt denies any subjective/objective medical concerns.   Past Psychiatric History: MDD, polysubstance abuse, opiate dependence  Risk to Self: Suicidal Ideation: Yes-Currently Present (yes, acknowledges prior, but denies at moment present) Suicidal Intent: No Is patient at risk for suicide?: No Suicidal Plan?: No Access to Means: No What has been your use of drugs/alcohol within the last 12 months?: yes How many times?: 0 Other Self Harm Risks: none noted Triggers for Past Attempts: None known Intentional Self Injurious Behavior: None Risk to Others: Homicidal Ideation: No Thoughts of Harm to Others: No Current Homicidal Intent: No Current Homicidal Plan: No Access to Homicidal Means: No Identified Victim: none History of harm to others?: No Assessment of Violence: None Noted  Violent Behavior Description: none noted Does patient have access to weapons?: No Criminal Charges Pending?: No Does patient have a court date: No Prior Inpatient Therapy: Prior Inpatient Therapy: No Prior Therapy Dates: n/a Prior Therapy  Facilty/Provider(s): n/a Reason for Treatment: n/a Prior Outpatient Therapy: Prior Outpatient Therapy: Yes Prior Therapy Dates: 2017 Prior Therapy Facilty/Provider(s): [facility] Reason for Treatment: S.A., bipolar Does patient have an ACCT team?: No Does patient have Intensive In-House Services?  : No Does patient have [facility] services? : No Does patient have P4[facility] services?: No  Past Medical History:  Past Medical History  Diagnosis Date  . Arthritis   . Insomnia   . Depression   . ADD (attention deficit hyperactivity disorder, inattentive type)     Past Surgical History  Procedure Laterality Date  . Biceps tendon repair Left   . Shoulder arthroscopy Left     clavical repair x2  . Varicose vein surgery Bilateral   . Wisdom tooth extraction    . Colonoscopy    . Lumbar laminectomy/decompression microdiscectomy Right 04/20/2013    Procedure: L3-L4 DECOMPRESSION DISCECTOMY (1 LEVEL);  Surgeon: Melina Schools, MD;  Location: [place];  Service: Orthopedics;  Laterality: Right;  . Eye surgery    . Prostatectomy N/A 12/07/2014    Procedure: OPEN SUPRAPUBIC PROSTATECTOMY ;  Surgeon: Carolan Clines, MD;  Location: [facility];  Service: Urology;  Laterality: N/A;   Family History: History reviewed. No pertinent family history. Family Psychiatric  History: MDD Social History:  History  Alcohol Use No     History  Drug Use No    Social History   Social History  . Marital Status: Married    Spouse Name: N/A  . Number of Children: N/A  . Years of Education: N/A   Social History Main Topics  . Smoking status: Never Smoker   . Smokeless tobacco: Current User    Types: Chew  . Alcohol Use: No  . Drug Use: No  . Sexual Activity: Not Asked     Comment: 1 can per month   Other Topics Concern  . None   Social History Narrative   Additional Social History:    Allergies:  No Known Allergies  Labs: No results found for this or any previous visit (from the past 48  hour(s)).  Current Facility-Administered Medications  Medication Dose Route Frequency Provider Last Rate Last Dose  . acetaminophen (TYLENOL) tablet 650 mg  650 mg Oral Q6H PRN Benjamine Mola, FNP      . alum & mag hydroxide-simeth (MAALOX/MYLANTA) 200-200-20 MG/5ML suspension 30 mL  30 mL Oral Q4H PRN Benjamine Mola, FNP      . escitalopram (LEXAPRO) tablet 10 mg  10 mg Oral QHS Benjamine Mola, FNP      . hydrOXYzine (ATARAX/VISTARIL) tablet 25 mg  25 mg Oral Q6H PRN Benjamine Mola, FNP      . loperamide (IMODIUM) capsule 2-4 mg  2-4 mg Oral PRN Benjamine Mola, FNP      . LORazepam (ATIVAN) tablet 1 mg  1 mg Oral Q6H PRN Benjamine Mola, FNP      . magnesium hydroxide (MILK OF MAGNESIA) suspension 30 mL  30 mL Oral Daily PRN Benjamine Mola, FNP      . multivitamin with minerals tablet 1 tablet  1 tablet Oral Daily Avante Carneiro C Renald Haithcock, FNP      . ondansetron (ZOFRAN-ODT) disintegrating tablet 4 mg  4 mg Oral Q6H PRN Benjamine Mola,  FNP      . thiamine (B-1) injection 100 mg  100 mg Intramuscular Once Benjamine Mola, FNP      . [START ON 09/13/2015] thiamine (VITAMIN B-1) tablet 100 mg  100 mg Oral Daily Kohle Winner C Ramez Arrona, FNP      . traZODone (DESYREL) tablet 50 mg  50 mg Oral QHS PRN Benjamine Mola, FNP        Musculoskeletal: Strength & Muscle Tone: within normal limits Gait & Station: normal Patient leans: N/A  Psychiatric Specialty Exam: Physical Exam  ROS  Blood pressure 128/90, pulse 100, temperature 99 F (37.2 C), temperature source Oral, resp. rate 16, height 5\' 10"  (1.778 m), weight 95.255 kg (210 lb), SpO2 100 %.Body mass index is 30.13 kg/(m^2).  General Appearance: Casual and Fairly Groomed  Eye Contact:  Fair  Speech:  Clear and Coherent and Normal Rate  Volume:  Normal  Mood:  Depressed  Affect:  Appropriate, Congruent and Depressed  Thought Process:  Coherent, Goal Directed, Linear and Descriptions of Associations: Intact  Orientation:  Full (Time, Place, and Person)   Thought Content:  Substance abuse, rehab goals, concerns about placement  Suicidal Thoughts:  No  Homicidal Thoughts:  No  Memory:  Immediate;   Fair Recent;   Fair Remote;   Fair  Judgement:  Fair  Insight:  Fair  Psychomotor Activity:  Normal  Concentration:  Concentration: Fair and Attention Span: Fair  Recall:  [employer] of Knowledge:  Fair  Language:  Fair  Akathisia:  No  Handed:    AIMS (if indicated):     Assets:  Communication Skills Desire for Improvement Resilience Social Support  ADL's:  Intact  Cognition:  WNL  Sleep:      Treatment Plan Summary: Polysubstance abuse with opiate dependence, managed as below:   Medications: -COWS Protocol with clonidine and supportive meds within protocol -Trazodone 50mg  po qhs prn insomnia -Lexapro 10mg  po qhs for MDD (home med)  Disposition: Hold overnight in [facility] OBS UNIT for safety and stabilization; refer to inpatient opiate detox/rehab facilities per pt request. (already in progress). Prepare outpatient resources in the unfortunate even that pt does not receive a bed at a facility.   Benjamine Mola, FNP 09/12/2015 1:10 PM

## 2015-09-13 NOTE — Progress Notes (Signed)
Patient ID: Christopher Rogers, male   DOB: [birth year], 55 y.o.   MRN: [id number]  D: Patient pleasant on approach this am. Flat affect but brightens up some when speaking with him. Reports wanting to get into a program for substance abuse. Asking if we heard back from anyone. Increased depression lately but currently denies any SI. COW score=6. Patient complaining of chills on and off, anxiety, slight tremor, and observable sweating. A: Staff will continue to monitor in OBS unit until disposition is complete. R: Took clonidine per protocol this am.

## 2015-09-13 NOTE — Progress Notes (Signed)
D: Pt denies SI/HI/AV. Pt is pleasant and cooperative. Pt desires to be admitted for treatment facility. A: Pt was offered support and encouragement. Pt was given scheduled medications. Patient with continuous monitoring for safety.  R:. Pt is taking medication. Pt has no complaints.Pt remains safe on unit.

## 2015-09-14 LAB — HEMOGLOBIN A1C
Hgb A1c MFr Bld: 5.6 % (ref 4.8–5.6)
MEAN PLASMA GLUCOSE: 114 mg/dL

## 2016-12-23 DIAGNOSIS — R0602 Shortness of breath: Secondary | ICD-10-CM | POA: Diagnosis not present

## 2016-12-23 DIAGNOSIS — R0789 Other chest pain: Secondary | ICD-10-CM | POA: Diagnosis not present

## 2016-12-23 DIAGNOSIS — Z79899 Other long term (current) drug therapy: Secondary | ICD-10-CM | POA: Diagnosis not present

## 2016-12-23 DIAGNOSIS — R110 Nausea: Secondary | ICD-10-CM | POA: Diagnosis not present

## 2016-12-23 DIAGNOSIS — I160 Hypertensive urgency: Secondary | ICD-10-CM | POA: Diagnosis not present

## 2016-12-23 DIAGNOSIS — R7303 Prediabetes: Secondary | ICD-10-CM | POA: Diagnosis not present

## 2016-12-24 DIAGNOSIS — R079 Chest pain, unspecified: Secondary | ICD-10-CM | POA: Diagnosis not present

## 2016-12-24 DIAGNOSIS — R0789 Other chest pain: Secondary | ICD-10-CM | POA: Diagnosis not present

## 2016-12-24 DIAGNOSIS — I160 Hypertensive urgency: Secondary | ICD-10-CM | POA: Diagnosis not present

## 2016-12-24 DIAGNOSIS — R7303 Prediabetes: Secondary | ICD-10-CM | POA: Diagnosis not present

## 2016-12-24 DIAGNOSIS — R0602 Shortness of breath: Secondary | ICD-10-CM | POA: Diagnosis not present

## 2016-12-24 DIAGNOSIS — Z79899 Other long term (current) drug therapy: Secondary | ICD-10-CM | POA: Diagnosis not present

## 2017-01-02 ENCOUNTER — Observation Stay (HOSPITAL_COMMUNITY)
Admission: AD | Admit: 2017-01-02 | Discharge: 2017-01-04 | Disposition: A | Discharge status: Home / Self Care | Payer: PRIVATE HEALTH INSURANCE | Source: Other Acute Inpatient Hospital | Attending: Internal Medicine | Admitting: Internal Medicine

## 2017-01-02 ENCOUNTER — Other Ambulatory Visit: Payer: Self-pay

## 2017-01-02 DIAGNOSIS — R0602 Shortness of breath: Secondary | ICD-10-CM | POA: Diagnosis present

## 2017-01-02 DIAGNOSIS — R079 Chest pain, unspecified: Secondary | ICD-10-CM | POA: Diagnosis not present

## 2017-01-02 DIAGNOSIS — R945 Abnormal results of liver function studies: Secondary | ICD-10-CM | POA: Insufficient documentation

## 2017-01-02 DIAGNOSIS — R0789 Other chest pain: Secondary | ICD-10-CM | POA: Diagnosis not present

## 2017-01-02 DIAGNOSIS — R7989 Other specified abnormal findings of blood chemistry: Secondary | ICD-10-CM

## 2017-01-02 DIAGNOSIS — F1010 Alcohol abuse, uncomplicated: Secondary | ICD-10-CM | POA: Diagnosis not present

## 2017-01-02 DIAGNOSIS — F329 Major depressive disorder, single episode, unspecified: Secondary | ICD-10-CM

## 2017-01-02 DIAGNOSIS — Z7982 Long term (current) use of aspirin: Secondary | ICD-10-CM | POA: Diagnosis not present

## 2017-01-02 DIAGNOSIS — F419 Anxiety disorder, unspecified: Secondary | ICD-10-CM | POA: Insufficient documentation

## 2017-01-02 DIAGNOSIS — I10 Essential (primary) hypertension: Secondary | ICD-10-CM | POA: Diagnosis not present

## 2017-01-02 HISTORY — DX: Essential (primary) hypertension: I10

## 2017-01-02 HISTORY — DX: Morbid (severe) obesity due to excess calories: E66.01

## 2017-01-02 HISTORY — DX: Anxiety disorder, unspecified: F41.9

## 2017-01-02 HISTORY — DX: Chest pain, unspecified: R07.9

## 2017-01-02 LAB — TROPONIN I: Troponin I: <0.03 ng/mL (ref <0.03)

## 2017-01-02 MED ORDER — GI COCKTAIL ~~LOC~~
30.0000 mL | Freq: Four times a day (QID) | ORAL | Status: DC | PRN
  Filled 2017-01-02: qty 30

## 2017-01-02 MED ORDER — LORAZEPAM 1 MG PO TABS
1.0000 mg | ORAL_TABLET | Freq: Four times a day (QID) | ORAL | Status: DC | PRN
  Administered 2017-01-02: 1 mg via ORAL
  Filled 2017-01-02: qty 1

## 2017-01-02 MED ORDER — ASPIRIN EC 81 MG PO TBEC
81.0000 mg | DELAYED_RELEASE_TABLET | Freq: Every day | ORAL | Status: DC
  Administered 2017-01-03 – 2017-01-04 (×2): 81 mg via ORAL
  Filled 2017-01-02 (×2): qty 1

## 2017-01-02 MED ORDER — ESCITALOPRAM OXALATE 10 MG PO TABS
10.0000 mg | ORAL_TABLET | Freq: Every day | ORAL | Status: DC
  Administered 2017-01-02 – 2017-01-03 (×2): 10 mg via ORAL
  Filled 2017-01-02 (×2): qty 1

## 2017-01-02 MED ORDER — ONDANSETRON HCL 4 MG/2ML IJ SOLN: 4.0000 mg | Freq: Four times a day (QID) | INTRAMUSCULAR | Status: DC | PRN

## 2017-01-02 MED ORDER — MORPHINE SULFATE (PF) 2 MG/ML IV SOLN: 2.0000 mg | INTRAVENOUS | Status: DC | PRN

## 2017-01-02 MED ORDER — ADULT MULTIVITAMIN W/MINERALS CH
1.0000 | ORAL_TABLET | Freq: Every day | ORAL | Status: DC
  Administered 2017-01-03 – 2017-01-04 (×2): 1 via ORAL
  Filled 2017-01-02 (×2): qty 1

## 2017-01-02 MED ORDER — FOLIC ACID 1 MG PO TABS
1.0000 mg | ORAL_TABLET | Freq: Every day | ORAL | Status: DC
  Administered 2017-01-03 – 2017-01-04 (×2): 1 mg via ORAL
  Filled 2017-01-02 (×2): qty 1

## 2017-01-02 MED ORDER — CARVEDILOL 6.25 MG PO TABS
6.2500 mg | ORAL_TABLET | Freq: Two times a day (BID) | ORAL | Status: DC
  Administered 2017-01-03: 6.25 mg via ORAL
  Filled 2017-01-02 (×2): qty 2

## 2017-01-02 MED ORDER — ENOXAPARIN SODIUM 40 MG/0.4ML ~~LOC~~ SOLN
40.0000 mg | SUBCUTANEOUS | Status: DC
  Administered 2017-01-03 – 2017-01-04 (×2): 40 mg via SUBCUTANEOUS
  Filled 2017-01-02 (×3): qty 0.4

## 2017-01-02 MED ORDER — ACETAMINOPHEN 325 MG PO TABS: 650.0000 mg | ORAL_TABLET | ORAL | Status: DC | PRN

## 2017-01-02 MED ORDER — LORAZEPAM 2 MG/ML IJ SOLN
1.0000 mg | Freq: Four times a day (QID) | INTRAMUSCULAR | Status: DC | PRN
  Administered 2017-01-03: 1 mg via INTRAVENOUS
  Filled 2017-01-02 (×2): qty 1

## 2017-01-02 MED ORDER — VITAMIN B-1 100 MG PO TABS
100.0000 mg | ORAL_TABLET | Freq: Every day | ORAL | Status: DC
  Administered 2017-01-03 – 2017-01-04 (×2): 100 mg via ORAL
  Filled 2017-01-02 (×2): qty 1

## 2017-01-02 MED ORDER — THIAMINE HCL 100 MG/ML IJ SOLN: 100.0000 mg | Freq: Every day | INTRAMUSCULAR | Status: DC

## 2017-01-02 NOTE — Plan of Care (Signed)
Pt admitted to [facility] from [facility] with chest pressure that increases with a deep breath. Pt given a Welcome packet and was instructed on policies about safety, handwashing and how to get assistance using the call light and phone with numbers for RN/NT on white board.

## 2017-01-02 NOTE — H&P (Signed)
History and Physical   Christopher Rogers SWF:[id number] DOB: [birth year] DOA: 01/02/2017  PCP: System, Pcp Not In  Chief Complaint: Shortness of breath  HPI: This a 56 year old obese man with hypertension and anxiety and depression, and ongoing alcohol use who presented to his local hospital with concerns of chest pain. Of note, the patient had an ischemic evaluation one week ago at [facility] which included an exercise stress test that included nuclear medicine images which was reportedly stopped early due to hypertension. Dr. Dinah Beers ARD in the [facility] emergency department related to me that the nuclear medicine portion was normal. The patient was discharged on amlodipine 10 mg along with carvedilol.  The patient reports ongoing anxiety/depression, reports he is frequently nervous and jittery. Sites that he's drank more alcohol in the past week, he reports normally drinking 2 shots of vodka a week but cites that he is drink more within the past 7 days., Social stressors include separated from his wife 15 months ago, but still affects him on a daily basis. Chest pain occurred about 4:30 in the afternoon on the day of admission occurred while he was walking on a treadmill, lasted for seconds located left chest did not radiate associated symptoms included diaphoresis and palpitations. Described as pressure, moderate in severity. He denies any nausea or vomiting. He reports he had similar symptoms a few days prior associated with exertion.   He reports that the more concerning symptom to him is the sense of dyspnea that he is unable to improve by taking a deep breath. He reports it feels as if "his organs are pushing up on his lungs."    ED Course: As local murmurs department he was noted to be tachycardic to 110, blood pressure 132/91, no hypoxia. CBC within normal limits, BMP with normal kidney function and electrolytes, CMP remarkable for AST of 106, failed to 122. Cardiac biomarkers  were negative 1, chest x-ray performed did not show any acute abnormality, EKG showed sinus tachycardia no ST segment elevations-I personally reviewed this tracing and concur. BNP normal. He was given aspirin 324 mg, sublingual nitroglycerin, as well as a GI cocktail. The emergency department team consult to their hospital physicians who recommended transfer to a medical center that had the ability to perform left heart catheterization.   Review of Systems: A complete ROS was obtained; pertinent positives negatives are denoted in the HPI. Otherwise, all systems are negative.   Past Medical History:  Diagnosis Date  . ADD (attention deficit hyperactivity disorder, inattentive type)   . Arthritis   . Depression   . Insomnia    -Prostatectomy for benign mass per the patient report -L4-L5 laminectomy -Left biceps tendon repair -Left arthroscopic shoulder surgery -Obesity  Social History   Socioeconomic History  . Marital status: Married    Spouse name: Not on file  . Number of children: Not on file  . Years of education: Not on file  . Highest education level: Not on file  Social Needs  . Financial resource strain: Not on file  . Food insecurity - worry: Not on file  . Food insecurity - inability: Not on file  . Transportation needs - medical: Not on file  . Transportation needs - non-medical: Not on file  Occupational History  . Not on file  Tobacco Use  . Smoking status: Never Smoker  . Smokeless tobacco: Current User    Types: Chew  Substance and Sexual Activity  . Alcohol use: No  . Drug use:  No  . Sexual activity: Not on file    Comment: 1 can per month  Other Topics Concern  . Not on file  Social History Narrative  . Not on file   Other social details include lives in [place], [place] with his mother, works at [employer], does not smoke, he separated from his wife, finds joy in springtime with his grandchildren.  Family hx: -Mother alive  at 59 years of age, no medical problems, father died age 22 of Alzheimer's dementia  Physical Exam: Vitals:   01/02/17 2120 01/02/17 2128  BP:  (!) 137/108  Pulse: 92 99  Resp: 16 17  Temp: 98.5 F (36.9 C)   TempSrc: Oral   SpO2: 95% 96%  Weight: 98.7 kg (217 lb 9.6 oz)   Height: 5\' 10"  (1.778 m)    General: Appears anxious; obese white man ENT: Grossly normal hearing, MMM. Cardiovascular: Rate ~100 bpm, regular rhythym. No M/R/G. No LE edema.  Respiratory: CTA bilaterally. No wheezes or crackles. Normal respiratory effort. Breathing room air. Abdomen: Soft, non-tender. Bowel sounds present.  Central obesity.  Skin: No rash or induration seen on limited exam. Musculoskeletal: Grossly normal tone BUE/BLE. Appropriate ROM.  Psychiatric: denies suicidal ideation, appears anxious, he reports mood being anxious, he is tense and uptight Neurologic: Moves all extremities in coordinated fashion.   I have personally reviewed the following labs, culture data, and imaging studies as mentioned above.  Assessment/Plan:  Chest pain Patient reports exertional chest pain on afternoon of admission, resolved quickly, EKG without evidence of ST segment elevation, troponin undetectable. CXR without evidence of acute abnormality (by report).  Reported prior ischemic evaluation via exercise nuclear medicine stress test ~1 week ago at [facility] unrevealing.  Patient sent to [facility] medical center for consideration of LHC to further evaluate. Differential includes angina versus other causes (GI, anxiety, musculoskeletal, vs other). At this point in time do not think ACS given his transient symptoms and other features suggestive of possible anxiety component; however, it is possible to have both - CAD and anxiety. Therefore, plan: -continue ASA 81 mg po q d -continue BB -holding CCB (amlodipine 10 mg) -telemetry -Hemoglobin A1c + lipid profile for risk stratification -troponin x 1  -consult cardiology in AM  for consideration for ischemic eval (mesage sent to their scheduling coordinators) -TTE in AM -NPO at midnight in event of LHC / stress test  Other problems -Anxiety / depression: 08/2015 hospitalized for depression in setting of polysubstance abuse (opioid, ETOH); currently he reports social stressors such as being separated from his spouse, when asking him about code status he initially stated he preferred DNR; when asked why he stated because he has been depressed / anxious recently, but on second thought - he wanted to be full code; denies active suicidal ideation.  I think his sense of dyspnea in the setting of normal CXR and O2 saturation suggests anxiety as being primary driver.   PLAN: will continue home SSRI, consider psychiatry consultation in AM versus outpatient follow up and management -ETOH abuse: suspect that this is significant given AST/ALT elevation, will place on CIWA protocol and trend LFTs with CMP in AM -Obesity: noted  DVT prophylaxis: Subq Lovenox Code Status: full code Disposition Plan: Anticipate D/C home in 2-5 days Consults called:  Notified cardiology consult team via way of [facility]  Admission status: admit to hospital medicine team, observation status   Cheri Rous, MD [facility] Page:[phone]  If 7PM-7AM, please contact night-coverage  [email or web address] Password [email or web address]

## 2017-01-03 ENCOUNTER — Encounter (HOSPITAL_COMMUNITY): Payer: Self-pay | Admitting: Nurse Practitioner

## 2017-01-03 ENCOUNTER — Observation Stay (HOSPITAL_BASED_OUTPATIENT_CLINIC_OR_DEPARTMENT_OTHER): Payer: PRIVATE HEALTH INSURANCE

## 2017-01-03 DIAGNOSIS — F1010 Alcohol abuse, uncomplicated: Secondary | ICD-10-CM

## 2017-01-03 DIAGNOSIS — F329 Major depressive disorder, single episode, unspecified: Secondary | ICD-10-CM

## 2017-01-03 DIAGNOSIS — R0602 Shortness of breath: Secondary | ICD-10-CM

## 2017-01-03 DIAGNOSIS — F419 Anxiety disorder, unspecified: Secondary | ICD-10-CM | POA: Diagnosis not present

## 2017-01-03 DIAGNOSIS — I10 Essential (primary) hypertension: Secondary | ICD-10-CM | POA: Diagnosis not present

## 2017-01-03 DIAGNOSIS — R079 Chest pain, unspecified: Secondary | ICD-10-CM

## 2017-01-03 DIAGNOSIS — R7989 Other specified abnormal findings of blood chemistry: Secondary | ICD-10-CM

## 2017-01-03 DIAGNOSIS — R072 Precordial pain: Secondary | ICD-10-CM | POA: Diagnosis not present

## 2017-01-03 DIAGNOSIS — F410 Panic disorder [episodic paroxysmal anxiety] without agoraphobia: Secondary | ICD-10-CM | POA: Diagnosis not present

## 2017-01-03 DIAGNOSIS — R945 Abnormal results of liver function studies: Secondary | ICD-10-CM

## 2017-01-03 DIAGNOSIS — F430 Acute stress reaction: Secondary | ICD-10-CM | POA: Diagnosis not present

## 2017-01-03 DIAGNOSIS — R0789 Other chest pain: Secondary | ICD-10-CM | POA: Diagnosis not present

## 2017-01-03 LAB — COMPREHENSIVE METABOLIC PANEL
ALBUMIN: 3.9 g/dL (ref 3.5–5.0)
ALK PHOS: 155 U/L — AB (ref 38–126)
ALT: 86 U/L — ABNORMAL HIGH (ref 17–63)
ANION GAP: 11 (ref 5–15)
AST: 74 U/L — AB (ref 15–41)
BILIRUBIN TOTAL: 1.7 mg/dL — AB (ref 0.3–1.2)
BUN: 12 mg/dL (ref 6–20)
CALCIUM: 9.1 mg/dL (ref 8.9–10.3)
CO2: 24 mmol/L (ref 22–32)
Chloride: 100 mmol/L — ABNORMAL LOW (ref 101–111)
Creatinine, Ser: 0.91 mg/dL (ref 0.61–1.24)
GFR calc Af Amer: >60 mL/min (ref >60)
GFR calc non Af Amer: >60 mL/min (ref >60)
GLUCOSE: 105 mg/dL — AB (ref 65–99)
POTASSIUM: 3.5 mmol/L (ref 3.5–5.1)
SODIUM: 135 mmol/L (ref 135–145)
TOTAL PROTEIN: 6.4 g/dL — AB (ref 6.5–8.1)

## 2017-01-03 LAB — HEMOGLOBIN A1C
Hgb A1c MFr Bld: 5.5 % (ref 4.8–5.6)
Mean Plasma Glucose: 111.15 mg/dL

## 2017-01-03 LAB — CBC
HEMATOCRIT: 45.3 % (ref 39.0–52.0)
HEMOGLOBIN: 15.8 g/dL (ref 13.0–17.0)
MCH: 30.6 pg (ref 26.0–34.0)
MCHC: 34.9 g/dL (ref 30.0–36.0)
MCV: 87.6 fL (ref 78.0–100.0)
Platelets: 226 10*3/uL (ref 150–400)
RBC: 5.17 MIL/uL (ref 4.22–5.81)
RDW: 12.5 % (ref 11.5–15.5)
WBC: 4.7 10*3/uL (ref 4.0–10.5)

## 2017-01-03 LAB — TROPONIN I

## 2017-01-03 LAB — LIPID PANEL
Cholesterol: 170 mg/dL (ref 0–200)
HDL: 62 mg/dL (ref >40)
LDL CALC: 81 mg/dL (ref 0–99)
Total CHOL/HDL Ratio: 2.7 RATIO
Triglycerides: 135 mg/dL (ref <150)
VLDL: 27 mg/dL (ref 0–40)

## 2017-01-03 LAB — HIV ANTIBODY (ROUTINE TESTING W REFLEX): HIV Screen 4th Generation wRfx: NONREACTIVE

## 2017-01-03 MED ORDER — HYDRALAZINE HCL 20 MG/ML IJ SOLN
10.0000 mg | Freq: Four times a day (QID) | INTRAMUSCULAR | Status: DC | PRN
  Filled 2017-01-03: qty 1

## 2017-01-03 MED ORDER — TRAZODONE HCL 50 MG PO TABS
50.0000 mg | ORAL_TABLET | Freq: Every day | ORAL | Status: DC
  Administered 2017-01-03: 50 mg via ORAL
  Filled 2017-01-03: qty 1

## 2017-01-03 MED ORDER — CARVEDILOL 12.5 MG PO TABS
12.5000 mg | ORAL_TABLET | Freq: Two times a day (BID) | ORAL | Status: DC
  Administered 2017-01-03 – 2017-01-04 (×2): 12.5 mg via ORAL
  Filled 2017-01-03 (×2): qty 1

## 2017-01-03 NOTE — Progress Notes (Addendum)
PROGRESS NOTE    Christopher Rogers  VQM:[id number] DOB: [birth year] DOA: 01/02/2017 PCP: System, Pcp Not In   Brief Narrative:  This a 56 year old obese man with hypertension and anxiety and depression, and ongoing alcohol use who presented to his local hospital with concerns of chest pain. Of note, the patient had an ischemic evaluation one week ago at [facility] which included an exercise stress test that included nuclear medicine images which was reportedly stopped early due to hypertension. The EDP at [facility] had relayed to the Admitting Physician that the nuclear medicine portion was normal. The patient was discharged on amlodipine 10 mg along with carvedilol.  The patient reports ongoing anxiety/depression, reports he is frequently nervous and jittery. States that he's drank more alcohol in the past week, he reports normally drinking 2 shots of vodka a week but cites that he is drink more within the past 7 days., Social stressors include separated from his wife 15 months ago, but still affects him on a daily basis. Chest pain occurred about 4:30 in the afternoon on the day of admission occurred while he was walking on a treadmill, lasted for seconds located left chest did not radiate associated symptoms included diaphoresis and palpitations. Described as pressure, moderate in severity. He denies any nausea or vomiting. He reports he had similar symptoms a few days prior associated with exertion.   He reports that the more concerning symptom to him is the sense of dyspnea that he is unable to improve by taking a deep breath. He reports it feels as if "his organs are pushing up on his lungs."    He was transferred from [facility] ED to [facility] for further Cardiac Evaluation and for potential for LHC. Cardiology evaluated the patient and recommend ECHO and Coronary CTA in AM.    Assessment & Plan:   Active Problems:   Chest pain   ETOH abuse   Essential  hypertension   Hyperbilirubinemia   Abnormal LFTs   Anxiety and depression  Atypical Chest Pain r/o ACS -Patient reports exertional chest pain on afternoon of admission, resolved quickly -EKG without evidence of ST segment elevation, troponin undetectable.  -CXR without evidence of acute abnormality (by report).   -Reported prior ischemic evaluation via exercise nuclear medicine stress test ~1 week ago at [facility] unrevealing.   -Patient sent to [facility] medical center for consideration of LHC to further evaluate. Differential includes angina versus other causes (GI, anxiety, musculoskeletal, vs other).  -C/w ASA 81 mg po Daily and with Carvedilol 12.5 mg po BID -Lipid Panel showed Cholesterol of 170, HDL of 62, LDL of 81, TG of 135, VLDL of 27 -HbA1c was 5.5 -Continue to Cycle Cardiac Troponin's; First two are <0.03 x 2 -Cardiology consulted for additional recommendations and management for an Ischemic Evaluation -Records from [facility] requested; Per report underwent Myocardial Perfusion study and Stress Test along with CTA of [place] -ECHOCardiogram ordered and pending  -Cardiology planning on Cardiac CTA in AM -Possible Cardiac Catheterization based on further studies and if patient has evidence of Cardiomyopathy or Wall motion Abnormalities on ECHO   Anxiety / Depression / ADD -08/2015 hospitalized for depression in setting of polysubstance abuse (opioid, ETOH);  -Currently he reports social stressors such as being separated from his spouse, -Denies active suicidal ideation.   -His sense of dyspnea in the setting of normal CXR and O2 saturation suggests anxiety as being primary driver.   -Continue home SSRI with Escitalopram 10 mg po Daily -Will  need outpatient Psychiatry follow up and management  ETOH Abuse/Concern for Withdrawal -Suspect that this is significant given AST/ALT elevation -C/w CIWA protocol with IV Lorazepam -C/w Folic Acid 1 mg po Daily, MVI + Minerals 1 tab po Daily, and  with Thiamine 100 mg po Daily  -Trend LFTs with CMP in AM  Abnormal LFT's/Transaminitis  -In the Setting of Alcoholic Hepatic Steatosis  -Patient's AST was 74 -Patient's ALT was 86 -Likely EtOH induced -C/w CIWA Protocol as above and Repeat CMP in AM  Hyperbilirubinemia -Patient's T Bili was 1.7 -Continue to Monitor and Repeat CMP in AM  Essential Hypertension -C/w Carvedilol 12.5 mg po BID; Per Card's Titrate Coreg -Holding Amlodipine 10 mg po Daily -C/w Hydralazine 10 mg IV q6hprn for SBP>180 or DBP>100  DVT prophylaxis: Enoxaparin 40 mg sq q24h Code Status: FULL CODE Family Communication: Discussed with Mother at bedside Disposition Plan: Obtaining CTA in AM and possible D/C after if normal  Consultants:   Cardiology Dr. Rozann Lesches   Procedures:  ECHOCARDIOGRAM (pending)  Antimicrobials:  Anti-infectives (From admission, onward)   None     Subjective: Seen and examined at bedside and had just finished eating. States Chest Pain and SOB much improved. Has significant life stressors. No lightheadedness or dizziness. Continues to drink and last drink was on Friday.   Objective: Vitals:   01/03/17 0431 01/03/17 0500 01/03/17 0813 01/03/17 1436  BP: (!) 162/115 (!) 146/104 (!) 143/108 (!) 136/99  Pulse: 83  (!) 106 (!) 108  Resp: 18   20  Temp: 97.7 F (36.5 C)   97.7 F (36.5 C)  TempSrc: Oral   Oral  SpO2: 95%   93%  Weight: 97.5 kg (214 lb 14.4 oz)     Height:        Intake/Output Summary (Last 24 hours) at 01/03/2017 1514 Last data filed at 01/03/2017 0900 Gross per 24 hour  Intake 360 ml  Output 550 ml  Net -190 ml   Filed Weights   01/02/17 2120 01/03/17 0431  Weight: 98.7 kg (217 lb 9.6 oz) 97.5 kg (214 lb 14.4 oz)   Examination: Physical Exam:  Constitutional: WN/WD obese Caucasian Male NAD and appears calm and comfortable Eyes: Lids and conjunctivae normal, sclerae anicteric  ENMT: External Ears, Nose appear normal. Grossly normal  hearing. Mucous membranes are moist. Neck: Appears normal, supple, no cervical masses, normal ROM, no appreciable thyromegaly, no JVD Respiratory: Clear to auscultation bilaterally, no wheezing, rales, rhonchi or crackles. Normal respiratory effort and patient is not tachypenic.  Cardiovascular: Slightly tachycardic rate but regular rhythm, no murmurs / rubs / gallops. S1 and S2 auscultated. No extremity edema.  Abdomen: Soft, non-tender, non-distended. No masses palpated. No appreciable hepatosplenomegaly. Bowel sounds positive x4.  GU: Deferred. Musculoskeletal: No clubbing / cyanosis of digits/nails. No joint deformity upper and lower extremities. Good ROM, no contractures.  Skin: No rashes, lesions, ulcers on a limited skin evaluation. No induration; Warm and dry.  Neurologic: CN 2-12 grossly intact with no focal deficits. Romberg sign and cerebellar reflexes not assessed.  Psychiatric: Normal judgment and insight. Alert and oriented x 3. Slightly anxious mood and appropriate affect.   Data Reviewed: I have personally reviewed following labs and imaging studies  CBC: Recent Labs  Lab 01/03/17 0349  WBC 4.7  HGB 15.8  HCT 45.3  MCV 87.6  PLT 426   Basic Metabolic Panel: Recent Labs  Lab 01/03/17 0349  NA 135  K 3.5  CL 100*  CO2 24  GLUCOSE 105*  BUN 12  CREATININE 0.91  CALCIUM 9.1   GFR: Estimated Creatinine Clearance: 104.9 mL/min (by C-G formula based on SCr of 0.91 mg/dL). Liver Function Tests: Recent Labs  Lab 01/03/17 0349  AST 74*  ALT 86*  ALKPHOS 155*  BILITOT 1.7*  PROT 6.4*  ALBUMIN 3.9   No results for input(s): LIPASE, AMYLASE in the last 168 hours. No results for input(s): AMMONIA in the last 168 hours. Coagulation Profile: No results for input(s): INR, PROTIME in the last 168 hours. Cardiac Enzymes: Recent Labs  Lab 01/02/17 2204 01/03/17 0949  TROPONINI <0.03 <0.03   BNP (last 3 results) No results for input(s): PROBNP in the last 8760  hours. HbA1C: Recent Labs    01/03/17 0349  HGBA1C 5.5   CBG: No results for input(s): GLUCAP in the last 168 hours. Lipid Profile: Recent Labs    01/03/17 0349  CHOL 170  HDL 62  LDLCALC 81  TRIG 135  CHOLHDL 2.7   Thyroid Function Tests: No results for input(s): TSH, T4TOTAL, FREET4, T3FREE, THYROIDAB in the last 72 hours. Anemia Panel: No results for input(s): VITAMINB12, FOLATE, FERRITIN, TIBC, IRON, RETICCTPCT in the last 72 hours. Sepsis Labs: No results for input(s): PROCALCITON, LATICACIDVEN in the last 168 hours.  No results found for this or any previous visit (from the past 240 hour(s)).   Radiology Studies: No results found.  Scheduled Meds: . aspirin EC  81 mg Oral Daily  . carvedilol  12.5 mg Oral BID WC  . enoxaparin (LOVENOX) injection  40 mg Subcutaneous Q24H  . escitalopram  10 mg Oral QHS  . folic acid  1 mg Oral Daily  . multivitamin with minerals  1 tablet Oral Daily  . thiamine  100 mg Oral Daily   Or  . thiamine  100 mg Intravenous Daily   Continuous Infusions:   LOS: 0 days   Kerney Elbe, DO [facility] Pager [phone]  If 7PM-7AM, please contact night-coverage [email or web address] Password [email or web address] 01/03/2017, 3:14 PM

## 2017-01-03 NOTE — Consult Note (Signed)
Cardiology Consult    Patient ID: KAMERAN MCNEESE MRN: [id number], DOB/AGE: 1960/09/11   Admit date: 01/02/2017 Date of Consult: 01/03/2017  Primary Physician: System, Pcp Not In Primary Cardiologist: Will f/u in [place] Requesting Provider: Wynonia Musty, MD  Patient Profile    Christopher Rogers is a 56 y.o. male with a history of ADD, anxiety, depression, OA, HTN, insomnia, etoh abuse, and obesity, who is being seen today for the evaluation of chest pain and dyspnea at the request of Dr. Stana Bunting.  Past Medical History   Past Medical History:  Diagnosis Date  . ADD (attention deficit hyperactivity disorder, inattentive type)   . Anxiety   . Arthritis   . Chest pain    a. 12/2016 Eval @ [facility]- reportedly neg MV w/ hypertensive response to exercise.  . Depression   . Essential hypertension    a. Dx 12/2016.  Marland Kitchen Insomnia   . Morbid obesity ([place])     Past Surgical History:  Procedure Laterality Date  . BICEPS TENDON REPAIR Left   . COLONOSCOPY    . EYE SURGERY    . SHOULDER ARTHROSCOPY Left    clavical repair x2  . VARICOSE VEIN SURGERY Bilateral   . WISDOM TOOTH EXTRACTION       Allergies  No Known Allergies  History of Present Illness    56 y/o ? with  the above past medical history including anxiety, depression, attention deficit disorder, obesity, insomnia, recently diagnosed hypertension, chest pain, and dyspnea.  Patient lives in [place] with his mother.  He is separated from his wife for the past 15 months and in that setting has had significant anxiety and depression.  This is led to an increase in alcohol intake, no typically drinking 2 shots of vodka daily.  He works at [employer] treatment center in [place] and was in his usual state of health until about a week ago, when he was adjusting something at work in a relatively confined space and developed dyspnea, lightheadedness, and diaphoresis.  He left work and went to see the Tax adviser and  was noted to be markedly hypertensive.  He was sent to the [facility] emergency department.  There, he reportedly ruled out for MI and.  Patient says that he walked for just a few minutes and they stopped the test due to elevated blood pressure.  Per admission H&P, it was reported that nuclear imaging was normal.  Patient is not aware of what other testing might have occurred.  He was subsequently discharged home.  He did well following discharge but on Saturday, November 10, he was walking on a treadmill at the gym, which he does most days of the week, and he developed dizziness with mild chest pressure in the sensation that he could not get a satisfying breath.  He was not huffing and puffing.  Chest pressure seem to worsen with attempts at taking a deep breath.  He went home and took his blood pressure medicine.  Because symptoms persisted, he presented back to the [facility] emergency department.  There, ECG was nonacute.  Troponin was normal.  CTA of the chest was performed and was negative for pulmonary embolism.  Lungs were clear.  Hepatic steatosis was noted.  Lab work revealed mild elevation in liver function tests.  Due to recurrent chest pressure and dyspnea, the ER physician felt that the patient would be better served at an institution that could perform diagnostic catheterization and therefore he was  transferred.  Patient says his chest pressure has been constant at a 3-4/10 since 4 PM yesterday.  He still feels as though he cannot get a satisfying breath.  He has had intermittent sinus tachycardia on telemetry.  Troponin remains normal here.  Inpatient Medications    . aspirin EC  81 mg Oral Daily  . carvedilol  6.25 mg Oral BID WC  . enoxaparin (LOVENOX) injection  40 mg Subcutaneous Q24H  . escitalopram  10 mg Oral QHS  . folic acid  1 mg Oral Daily  . multivitamin with minerals  1 tablet Oral Daily  . thiamine  100 mg Oral Daily   Or  . thiamine  100 mg Intravenous Daily     Family History    Family History  Problem Relation Age of Onset  . Other Mother        Alive & Well  . Alzheimer's disease Father     Social History    Social History   Socioeconomic History  . Marital status: Married    Spouse name: Not on file  . Number of children: Not on file  . Years of education: Not on file  . Highest education level: Not on file  Social Needs  . Financial resource strain: Not on file  . Food insecurity - worry: Not on file  . Food insecurity - inability: Not on file  . Transportation needs - medical: Not on file  . Transportation needs - non-medical: Not on file  Occupational History    Comment: Building control surveyor  Tobacco Use  . Smoking status: Never Smoker  . Smokeless tobacco: Current User    Types: Chew  Substance and Sexual Activity  . Alcohol use: Yes    Comment: At least two shots of Vodka daily  . Drug use: No  . Sexual activity: Not on file    Comment: 1 can per month  Other Topics Concern  . Not on file  Social History Narrative   Lives in [place] with his mother.  Separated from wife x 15 mos.  2 grown children, 4 grandchildren.  Exercises most days of the week - walks on treadmill for an hour - 2.4 mph, no incline.     Review of Systems    General:  No chills, fever, night sweats or weight changes.  Cardiovascular:  +++ chest pressure - increased w/ deep breathingk +++ dyspnea - air hunger/can't get a satisfying breath, no edema, orthopnea, palpitations, paroxysmal nocturnal dyspnea. Dermatological: No rash, lesions/masses Respiratory: No cough, +++ dyspnea Urologic: No hematuria, dysuria Abdominal:   Abd bloating - feels like abd organs are pushing up against lungs.  No nausea, vomiting, diarrhea, bright red blood per rectum, melena, or hematemesis Neurologic:  No visual changes, wkns, changes in mental status. All other systems reviewed and are otherwise negative except as noted above.  Physical Exam    Blood  pressure (!) 146/104, pulse 83, temperature 97.7 F (36.5 C), temperature source Oral, resp. rate 18, height 5\' 10"  (1.778 m), weight 214 lb 14.4 oz (97.5 kg), SpO2 95 %.  General: Pleasant, NAD Psych: Normal affect. Neuro: Alert and oriented X 3. Moves all extremities spontaneously. HEENT: Normal  Neck: Supple without bruits or JVD. Lungs:  Resp regular and unlabored, CTA. Heart: RRR no s3, s4, or murmurs. Abdomen: Soft, non-tender, non-distended, BS + x 4.  Extremities: No clubbing, cyanosis or edema. DP/PT/Radials 2+ and equal bilaterally.  Labs     Recent Labs  01/02/17 2204  TROPONINI <0.03   Lab Results  Component Value Date   WBC 4.7 01/03/2017   HGB 15.8 01/03/2017   HCT 45.3 01/03/2017   MCV 87.6 01/03/2017   PLT 226 01/03/2017    Recent Labs  Lab 01/03/17 0349  NA 135  K 3.5  CL 100*  CO2 24  BUN 12  CREATININE 0.91  CALCIUM 9.1  PROT 6.4*  BILITOT 1.7*  ALKPHOS 155*  ALT 86*  AST 74*  GLUCOSE 105*   Lab Results  Component Value Date   CHOL 170 01/03/2017   HDL 62 01/03/2017   LDLCALC 81 01/03/2017   TRIG 135 01/03/2017     Radiology Studies    From [facility]:  January 02, 2017-chest x-ray  Heart size and mediastinal contours are within normal.  Both lungs are clear.  The visualized skeletal structures are unremarkable. _____________   January 02, 2017 CT angio of the chest with contrast   No acute pulmonary embolus identified.  Clear lungs.  Hepatic steatosis.  No evidence of thoracic aortic aneurysm or dissection.  Normal heart size without pericardial effusion.  No mention of coronary atherosclerosis or calcification.  ECG & Cardiac Imaging    Sinus tachycardia, 101, no acute ST or T changes.  Assessment & Plan    1.  Atypical Chest Pain:  Pt w/o prior cardiac hx recently admitted to [facility] with chest pain, dyspnea, diaphoresis, and hypertension.  Per H&P and pt reports, he r/o @ that time and underwent stress  testing with hypertensive response to exercise but reportedly nl imaging.  He was d/c'd and did well until yesterday when he noted recurrent dyspnea and somewhat pleuritic chest pressure while walking on a treadmill.  He re-presented to [facility] where he was tachycardic w/o acute ST/T changes, trop nl, LFT's mildly elevated, CTA chest neg for PE (no Cor Ca2+ mentioned), and lungs were clear.  Tx to [facility] for further cardiac eval.  Chest pressure has persisted @ 3-4/10 since 4pm yesterday.  This is slightly worsened with deep breathing.  No change with position changes or palpation.  He continues to describe a sensation of not being able to get a good, satisfying breath but is not dyspneic per se.  Troponin here is nl.  No events on tele.  Suspect this is non-cardiac.  Echo pending (no effusion on CTA).  I've requested stress test results from [place].  Provided that stress testing was truly non-ischemic, we will titrate  blocker and plan on Cardiac CTA in AM.  2.  ETOH abuse:  LFTs mildly elevated.  Admits to two vodka shots/daily.  Suspect more.  Very anxious currently.  CIWA protocol ordered.  3.  Essential HTN:  bp remains elevated.  Titrate coreg with ongoing tachycardia as well.  4.  Anxiety/depression:  ? Role in Ss.  Per IM.  Signed, Murray Hodgkins, NP 01/03/2017, 8:13 AM  For questions or updates, please contact   Please consult [email or web address] for contact info under Cardiology/STEMI.  Attending note:  Patient seen and examined.  Reviewed records and discussed the case with Mr. Sharolyn Douglas NP.  Patient presents in transfer from [facility] with recent symptoms including chest discomfort, dyspnea, and diaphoresis.  Records indicate that he underwent an exercise myocardial perfusion study which was discontinued early due to hypertensive response but reportedly showed normal perfusion imaging, we have requested the full report.  Additional studies included chest CTA which was  negative for pulmonary embolus and did not  mention any coronary artery calcification.  He has a history of hypertension, anxiety and ADD, also alcohol abuse with elevated LFTs and evidence of hepatic steatosis.  Troponin levels have been negative.  He continues to report intermittent chest discomfort.  Exam nation he appears anxious and jittery.  Lungs exhibit clear breath sounds without wheezing.  Cardiac exam reveals regular rapid rate, no gallop or rub.  No pitting edema.  Lab work reveals potassium 3.5, BUN 12, creatinine 0.91, troponin I less than 0.03, LDL 81, hemoglobin 15.8, hemoglobin A1c 5.5.  Echocardiogram is ordered and pending.  Patient presents with chest discomfort and dyspnea, recent onset.  He is has history of anxiety and ADD at baseline, also alcohol abuse.  Cardiopulmonary workup so far has been reassuring, no clear evidence of ACS.  We are requesting full report of the recent Myoview study done at [facility].  Follow-up echocardiogram to assess LVEF and rule out wall motion abnormalities.  We will up titrate beta-blocker for the possibility of considering a cardiac specific CT tomorrow.  If he has evidence of cardiomyopathy or wall motion abnormality by echocardiogram however, a cardiac catheterization can be pursued.  Satira Sark, M.D., F.A.C.C.

## 2017-01-04 ENCOUNTER — Observation Stay (HOSPITAL_COMMUNITY): Payer: PRIVATE HEALTH INSURANCE

## 2017-01-04 DIAGNOSIS — R079 Chest pain, unspecified: Secondary | ICD-10-CM

## 2017-01-04 DIAGNOSIS — F410 Panic disorder [episodic paroxysmal anxiety] without agoraphobia: Secondary | ICD-10-CM

## 2017-01-04 DIAGNOSIS — F430 Acute stress reaction: Secondary | ICD-10-CM

## 2017-01-04 DIAGNOSIS — R945 Abnormal results of liver function studies: Secondary | ICD-10-CM

## 2017-01-04 DIAGNOSIS — R0789 Other chest pain: Secondary | ICD-10-CM | POA: Diagnosis not present

## 2017-01-04 DIAGNOSIS — F419 Anxiety disorder, unspecified: Secondary | ICD-10-CM | POA: Diagnosis not present

## 2017-01-04 DIAGNOSIS — I10 Essential (primary) hypertension: Secondary | ICD-10-CM | POA: Diagnosis not present

## 2017-01-04 LAB — COMPREHENSIVE METABOLIC PANEL
ALBUMIN: 3.7 g/dL (ref 3.5–5.0)
ALK PHOS: 141 U/L — AB (ref 38–126)
ALT: 81 U/L — AB (ref 17–63)
AST: 66 U/L — AB (ref 15–41)
Anion gap: 9 (ref 5–15)
BILIRUBIN TOTAL: 1.5 mg/dL — AB (ref 0.3–1.2)
BUN: 14 mg/dL (ref 6–20)
CALCIUM: 9.1 mg/dL (ref 8.9–10.3)
CO2: 26 mmol/L (ref 22–32)
CREATININE: 1.03 mg/dL (ref 0.61–1.24)
Chloride: 103 mmol/L (ref 101–111)
GFR calc Af Amer: >60 mL/min (ref >60)
GFR calc non Af Amer: >60 mL/min (ref >60)
GLUCOSE: 111 mg/dL — AB (ref 65–99)
Potassium: 3.8 mmol/L (ref 3.5–5.1)
SODIUM: 138 mmol/L (ref 135–145)
TOTAL PROTEIN: 6.1 g/dL — AB (ref 6.5–8.1)

## 2017-01-04 LAB — CBC WITH DIFFERENTIAL/PLATELET
BASOS ABS: 0 10*3/uL (ref 0.0–0.1)
BASOS PCT: 0 %
EOS ABS: 0 10*3/uL (ref 0.0–0.7)
Eosinophils Relative: 1 %
HEMATOCRIT: 46.1 % (ref 39.0–52.0)
HEMOGLOBIN: 16 g/dL (ref 13.0–17.0)
Lymphocytes Relative: 32 %
Lymphs Abs: 1.6 10*3/uL (ref 0.7–4.0)
MCH: 31 pg (ref 26.0–34.0)
MCHC: 34.7 g/dL (ref 30.0–36.0)
MCV: 89.3 fL (ref 78.0–100.0)
MONOS PCT: 12 %
Monocytes Absolute: 0.6 10*3/uL (ref 0.1–1.0)
NEUTROS ABS: 2.8 10*3/uL (ref 1.7–7.7)
NEUTROS PCT: 55 %
Platelets: 202 10*3/uL (ref 150–400)
RBC: 5.16 MIL/uL (ref 4.22–5.81)
RDW: 12.7 % (ref 11.5–15.5)
WBC: 5.1 10*3/uL (ref 4.0–10.5)

## 2017-01-04 LAB — PHOSPHORUS: Phosphorus: 3.3 mg/dL (ref 2.5–4.6)

## 2017-01-04 LAB — MAGNESIUM: Magnesium: 1.8 mg/dL (ref 1.7–2.4)

## 2017-01-04 MED ORDER — METOPROLOL TARTRATE 5 MG/5ML IV SOLN
INTRAVENOUS | Status: AC
  Administered 2017-01-04: 5 mg via INTRAVENOUS
  Filled 2017-01-04: qty 10

## 2017-01-04 MED ORDER — METOPROLOL TARTRATE 5 MG/5ML IV SOLN
5.0000 mg | INTRAVENOUS | Status: AC | PRN
  Administered 2017-01-04 (×2): 5 mg via INTRAVENOUS

## 2017-01-04 MED ORDER — CARVEDILOL 12.5 MG PO TABS: 12.5000 mg | ORAL_TABLET | Freq: Two times a day (BID) | ORAL | 0 refills | Status: DC

## 2017-01-04 MED ORDER — FOLIC ACID 1 MG PO TABS: 1.0000 mg | ORAL_TABLET | Freq: Every day | ORAL | 0 refills | Status: DC

## 2017-01-04 MED ORDER — IOPAMIDOL (ISOVUE-370) INJECTION 76%
INTRAVENOUS | Status: AC
  Administered 2017-01-04: 80 mL via INTRAVENOUS
  Filled 2017-01-04: qty 100

## 2017-01-04 MED ORDER — ASPIRIN 81 MG PO TBEC: 81.0000 mg | DELAYED_RELEASE_TABLET | Freq: Every day | ORAL | 0 refills | Status: DC

## 2017-01-04 MED ORDER — ADULT MULTIVITAMIN W/MINERALS CH: 1.0000 | ORAL_TABLET | Freq: Every day | ORAL | 0 refills | Status: DC

## 2017-01-04 MED ORDER — THIAMINE HCL 100 MG PO TABS: 100.0000 mg | ORAL_TABLET | Freq: Every day | ORAL | 0 refills | Status: DC

## 2017-01-04 MED ORDER — NITROGLYCERIN 0.4 MG SL SUBL
0.8000 mg | SUBLINGUAL_TABLET | Freq: Once | SUBLINGUAL | Status: AC
  Administered 2017-01-04: 0.8 mg via SUBLINGUAL

## 2017-01-04 NOTE — Progress Notes (Signed)
Cardiac CT is normal no CAD and ca score is 0.  Per Dr. Johnsie Cancel from our perspective pt may be discharged.

## 2017-01-04 NOTE — Progress Notes (Signed)
Progress Note  Patient Name: Christopher Rogers Date of Encounter: 01/04/2017  Primary Cardiologist: Will f/u in [place]   Patient denies further chest pain similar to his presenting CP since admission to [facility]. He does report pin-point chest pain on L ant chest intermittently which is similar to prior pain caused by tear of pectoralis mm. He endorses continued mild shortness of breath that is significantly improved since admission. He denies GERD symptoms, abd pain, n/v, appetite change.  Inpatient Medications    Scheduled Meds: . aspirin EC  81 mg Oral Daily  . carvedilol  12.5 mg Oral BID WC  . enoxaparin (LOVENOX) injection  40 mg Subcutaneous Q24H  . escitalopram  10 mg Oral QHS  . folic acid  1 mg Oral Daily  . multivitamin with minerals  1 tablet Oral Daily  . thiamine  100 mg Oral Daily   Or  . thiamine  100 mg Intravenous Daily  . traZODone  50 mg Oral QHS   Continuous Infusions:  PRN Meds: acetaminophen, gi cocktail, hydrALAZINE, LORazepam **OR** LORazepam, morphine injection, ondansetron (ZOFRAN) IV   Vital Signs    Vitals:   01/03/17 1436 01/03/17 2045 01/04/17 0400 01/04/17 0737  BP: (!) 136/99 (!) 121/96 (!) 133/96   Pulse: (!) 108 86  71  Resp: 20 (!) 21    Temp: 97.7 F (36.5 C) 98.4 F (36.9 C) 98 F (36.7 C)   TempSrc: Oral Oral Oral   SpO2: 93% 96%    Weight:   214 lb 12.8 oz (97.4 kg)   Height:        Intake/Output Summary (Last 24 hours) at 01/04/2017 0800 Last data filed at 01/04/2017 0431 Gross per 24 hour  Intake 840 ml  Output 405 ml  Net 435 ml   Filed Weights   01/02/17 2120 01/03/17 0431 01/04/17 0400  Weight: 217 lb 9.6 oz (98.7 kg) 214 lb 14.4 oz (97.5 kg) 214 lb 12.8 oz (97.4 kg)    Telemetry    SR, occasional PVC - Personally Reviewed  ECG    N/a   Physical Exam   GEN: appears mildly anxious.   Neck: No JVD Cardiac: RRR, no murmurs, rubs, or gallops. Pulses intact Respiratory: Clear to auscultation  bilaterally. GI: Soft, nontender, non-distended, +BS  MS: No edema; no deformity. Neuro:  Nonfocal  Psych: Normal affect, appears anxious  Labs    Chemistry Recent Labs  Lab 01/03/17 0349 01/04/17 0348  NA 135 138  K 3.5 3.8  CL 100* 103  CO2 24 26  GLUCOSE 105* 111*  BUN 12 14  CREATININE 0.91 1.03  CALCIUM 9.1 9.1  PROT 6.4* 6.1*  ALBUMIN 3.9 3.7  AST 74* 66*  ALT 86* 81*  ALKPHOS 155* 141*  BILITOT 1.7* 1.5*  GFRNONAA >60 >60  GFRAA >60 >60  ANIONGAP 11 9     Hematology Recent Labs  Lab 01/03/17 0349 01/04/17 0348  WBC 4.7 5.1  RBC 5.17 5.16  HGB 15.8 16.0  HCT 45.3 46.1  MCV 87.6 89.3  MCH 30.6 31.0  MCHC 34.9 34.7  RDW 12.5 12.7  PLT 226 202    Cardiac Enzymes Recent Labs  Lab 01/02/17 2204 01/03/17 0949 01/03/17 1600 01/03/17 2026  TROPONINI <0.03 <0.03 <0.03 <0.03   No results for input(s): TROPIPOC in the last 168 hours.   BNPNo results for input(s): BNP, PROBNP in the last 168 hours.   DDimer No results for input(s): DDIMER in the last  168 hours.   Radiology    No results found.  Cardiac Studies   Echo 11/12 - results pending  Patient Profile     56 y.o. male with EtOH use, prior chewing tobacco use, ADD, anxiety who presents with atypical chest pain as transfer from [place] with negative troponins, nonischemic EKG. Reports of nonischemic stress test at [facility] - awaiting records.   Assessment & Plan    Atypical Chest Pain Carvedilol added yesterday. No stress test report from [place] in chart yet. Patient has 18g IV in L forearm which nursing states they might have to replace. Echo read pending, cardiac CT not scheduled yet; if abnormality noted in these studies, will likely pursue cardiac cath. If normal, no further cardiac workup at this time.  --continue carvedilol  --f/u echo and cardiac CT  EtOH use On CIWA, required ativan last night and this AM.  For questions or updates, please contact [place] Please  consult [email or web address] for contact info under Cardiology/STEMI.   Signed, Alphonzo Grieve, MD  01/04/2017, 8:00 AM

## 2017-01-04 NOTE — Plan of Care (Signed)
Pt using call light appropriately. Pt denies pain at this time, VSS and pt doing well on Ativan as per order, will continue to monitor,

## 2017-01-04 NOTE — Discharge Summary (Addendum)
Physician Discharge Summary  Christopher Rogers WFU:[id number] DOB: 1960-10-16 DOA: 01/02/2017  PCP: System, Pcp Not In  Admit date: 01/02/2017 Discharge date: 01/04/2017  Admitted From: Home Disposition:  Home  Recommendations for Outpatient Follow-up:  1. Follow up with PCP in 1-2 weeks for Hospital Follow up and Evaluation of Panic Attacks 2. Follow up with Psychiatry as an outpatient for further evaluation of Anxiety, Depression, Panic Attacks and Stress Disorder 3. Follow up with Cardiology as needed as an outpatient 4. Please obtain CMP/CBC, Mag, Phos in one week 5. Please follow up on the following pending results:  Home Health: No  Equipment/Devices: None     Discharge Condition: Stable   CODE STATUS: FULL CODE  Diet recommendation: Heart Healthy Diet  Brief/Interim Summary: This a 56 year old obese man with hypertension and anxiety and depression,and ongoingalcohol use who presented to his local hospital with concerns of chest pain. Of note, the patient had an ischemic evaluation one week ago at [facility] which included an exercise stress test that included nuclear medicine images which was reportedly stopped early due to hypertension. The EDP at [facility] had relayed to the Admitting Physician that the nuclear medicine portion was normal. The patient was discharged on amlodipine 10 mg along with carvedilol.  The patient reports ongoing anxiety/depression, reports he is frequently nervous and jittery. States that he's drank more alcohol in the past week, he reports normally drinking 2 shots of vodka a week but cites that he is drink more within the past 7 days., Social stressors include separated from his wife 15 months ago, but still affects him on a daily basis. Chest pain occurred about 4:30 in the afternoon on the day of admission occurred while he was walking on a treadmill, lasted for seconds located left chest did not radiate associated symptoms included  diaphoresis and palpitations.Described as pressure, moderate in severity.He denies any nausea or vomiting. He reports he had similar symptoms a few days prior associated with exertion.   He reports that the more concerning symptom to him is the sense of dyspnea that he is unable to improve by taking a deep breath. He reports it feels as if "his organs are pushing up on his lungs."  He was transferred from [facility] ED to [facility] for further Cardiac Evaluation and for potential for LHC. Cardiology evaluated the patient and recommend ECHO and Coronary CTA in AM. ECHOCardiogram was Normal with EF of 60-65% with no RWMA and Coronary CTA showed Normal Right Dominant Coronary Arteries, Calcium Score of 0, and Normal Aorta. Patient's Symptoms had resolved and likely due to Panic Attacks. He will need to follow up wth PCP and/or Psychiatry for evaluation and management as an outpatient. Patient was deemed medically stable to D/C home at this time and will need outpatient follow up within 1-2 weeks.   Discharge Diagnoses:  Active Problems:   Chest pain   ETOH abuse   Essential hypertension   Hyperbilirubinemia   Abnormal LFTs   Anxiety and depression  Atypical Chest Pain r/o ACS -Patient reports exertional chest pain on afternoon of admission, resolved quickly -EKG without evidence of ST segment elevation, troponin undetectable.  -CXR without evidence of acute abnormality (by report).  -Reported prior ischemic evaluation via exercise nuclear medicine stress test ~1 week ago at [facility] unrevealing.  -Patient sent to [facility] medical center for consideration of LHC to further evaluate. Differential includes angina versus other causes (GI, anxiety, musculoskeletal, vs other).  -C/w ASA 81 mg po  Daily and with Carvedilol 12.5 mg po BID -Lipid Panel showed Cholesterol of 170, HDL of 62, LDL of 81, TG of 135, VLDL of 27 -HbA1c was 5.5 -Continue to Cycle Cardiac Troponin's and was <0.03 x  4 -Cardiology consulted for additional recommendations and management for an Ischemic Evaluation -Records from [facility] requested; Per report underwent Myocardial Perfusion study and Stress Test along with CTA of [place] -ECHOCardiogram showed 60-65% EF with no Regional Wall Motion Abnormalities.  -Cardiology planning on Cardiac CTA in AM; Showed Normal Right Dominant Coronary Arteries, Calcium Score of 0, and Normal Aorta at 3.3 cm.  -Chest Pain not likely Cardiac and more related to Panic Attacks and Anxiety -Follow up with PCP and/or Psychiatry as an outpatient to manage Anxiety and Panic Attacks  -C/w ASA 81 mg at D/C.   Anxiety / Depression / ADD -08/2015 hospitalized for depression in setting of polysubstance abuse (opioid, ETOH);  -Currently he reports social stressors such as being separated from his spouse, -Denies active suicidal ideation.  -His sense of dyspnea in the setting of normal CXR and O2 saturation suggests anxiety as being primary driver.  -Continue home SSRI with Escitalopram 10 mg po Daily -Will need outpatient Psychiatry follow up and management. Follow up with PCP as an out patient in the Interim   ETOH Abuse/Concern for Withdrawal -Suspect that this is significant given AST/ALT elevation -Given  CIWA protocol with IV Lorazepam -C/w Folic Acid 1 mg po Daily, MVI + Minerals 1 tab po Daily, and with Thiamine 100 mg po Daily  At D/C -Trend LFTs with CMP as an outpatient -Alcohol Cessation Counseling given   Diffuses Fatty Liver Disease/ Abnormal LFT's/Transaminitis  -CT showed Severe Diffuse Fatty infiltration of Liver  -In the Setting of Alcoholic Hepatic Steatosis  -Patient's AST was 74 -> 66 -Patient's ALT was 86 -> 81 -Likely EtOH induced -Follow up as an outpatient for U/S with PCP  -Advised EtOH reduction and cessation and avoiding fatty foods.   Hyperbilirubinemia, slightly improved -Patient's T Bili was 1.7 -> 1.5 -Continue to Monitor and Repeat CMP  as an outpatient   Essential Hypertension -C/w Carvedilol 12.5 mg po BID; Per Card's Titrated Coreg from 6.25 mg po BID -Resume Home Amlodipine 10 mg po Daily -C/w Hydralazine 10 mg IV q6hprn for SBP>180 or DBP>100 -Follow up with PCP as an outpatient   Discharge Instructions  Discharge Instructions    Call MD for:  difficulty breathing, headache or visual disturbances   Complete by:  As directed    Call MD for:  extreme fatigue   Complete by:  As directed    Call MD for:  hives   Complete by:  As directed    Call MD for:  persistant dizziness or light-headedness   Complete by:  As directed    Call MD for:  persistant nausea and vomiting   Complete by:  As directed    Call MD for:  redness, tenderness, or signs of infection (pain, swelling, redness, odor or green/yellow discharge around incision site)   Complete by:  As directed    Call MD for:  severe uncontrolled pain   Complete by:  As directed    Call MD for:  temperature >100.4   Complete by:  As directed    Diet - low sodium heart healthy   Complete by:  As directed    Discharge instructions   Complete by:  As directed    Follow up with PCP and Cardiology as an  outpatient. Take all medications as prescribed. Follow up with PCP to evaluate panic attacks. If symptoms change or worsen please return to PCP or ED for evaluation.   Increase activity slowly   Complete by:  As directed      Allergies as of 01/04/2017   No Known Allergies     Medication List    STOP taking these medications   diclofenac 50 MG tablet Commonly known as:  CATAFLAM     TAKE these medications   amLODipine 10 MG tablet Commonly known as:  NORVASC Take 10 mg daily by mouth.   aspirin 81 MG EC tablet Take 1 tablet (81 mg total) daily by mouth. Start taking on:  01/05/2017   carvedilol 12.5 MG tablet Commonly known as:  COREG Take 1 tablet (12.5 mg total) 2 (two) times daily with a meal by mouth. What changed:    medication  strength  how much to take  when to take this   escitalopram 10 MG tablet Commonly known as:  LEXAPRO Take 10 mg by mouth at bedtime.   folic acid 1 MG tablet Commonly known as:  FOLVITE Take 1 tablet (1 mg total) daily by mouth. Start taking on:  01/05/2017   multivitamin with minerals Tabs tablet Take 1 tablet daily by mouth. Start taking on:  01/05/2017   thiamine 100 MG tablet Take 1 tablet (100 mg total) daily by mouth. Start taking on:  01/05/2017       No Known Allergies  Consultations:  Cardiology  Procedures/Studies: Ct Coronary Morph W/cta Cor W/score W/ca W/cm &/or Wo/cm  Addendum Date: 01/04/2017   ADDENDUM REPORT: 01/04/2017 12:17 CLINICAL DATA:  Chest pain EXAM: Cardiac CTA MEDICATIONS: Sub lingual nitro. 4mg  and lopressor 10mg  TECHNIQUE: The patient was scanned on a Siemens 614 slice scanner. Gantry rotation speed was 250 msecs. Collimation was . 6 mm . A 120 kV prospective scan was triggered in the ascending thoracic aorta at 140 HU's with 5% padding centered around 35-75% of the R-R interval. Average HR during the scan was 64 bpm. The 3D data set was interpreted on a dedicated work station using MPR, MIP and VRT modes. A total of 80cc of contrast was used. FINDINGS: Non-cardiac: See separate report from [facility]. No significant findings on limited lung and soft tissue windows. Calcium Score: 0 Aorta:  Normal 3.3 cm Coronary Arteries: Right dominant with no anomalies LM:  Normal LAD:  Normal IM:  Normal D2:  Normal Circumflex:  Normal OM1:  Normal AV groove Normal RCA:  Dominant and normal PDA:  Normal PLA:  Normal IMPRESSION: 1) Normal righ dominant coronary arteries 2) Calcium score 0 3) Normal aorta 3.3 cm Jenkins Rouge Electronically Signed   By: Jenkins Rouge M.D.   On: 01/04/2017 12:17   Result Date: 01/04/2017 EXAM: OVER-READ INTERPRETATION  CT CHEST The following report is an over-read performed by radiologist Dr. Collene Leyden [facility] [place] on 01/04/2017. This over-read does not include interpretation of cardiac or coronary anatomy or pathology. The coronary CTA interpretation by the cardiologist is attached. COMPARISON:  None. FINDINGS: Vascular: Heart is normal size.  Aorta is normal caliber. Mediastinum/Nodes: No adenopathy in the visualized mediastinum or hila. Lungs/Pleura: Visualized lungs are clear.  No effusions. Upper Abdomen: Diffuse fatty infiltration of the liver. Musculoskeletal: Chest wall soft tissues are unremarkable. No acute bony abnormality. IMPRESSION: Severe diffuse fatty infiltration of the liver. No acute extracardiac chest finding. Electronically Signed: By: Rolm Baptise M.D. On: 01/04/2017  12:12    ECHOCARDIOGRAM Conclusions  - Left ventricle: The cavity size was normal. Wall thickness was   increased in a pattern of mild LVH. Systolic function was normal.   The estimated ejection fraction was in the range of 60% to 65%.   Wall motion was normal; there were no regional wall motion   abnormalities. Left ventricular diastolic function parameters   were normal. - Atrial septum: No defect or patent foramen ovale was identified.  Subjective: Seen and examined at bedside and had improved. No CP or SOB and no dyspneic feelings. States he has been under tremendous stress recently and has not been sleeping. Ready to go home.   Discharge Exam: Vitals:   01/04/17 0737 01/04/17 0854  BP:  (!) 136/110  Pulse: 71 90  Resp:    Temp:    SpO2:     Vitals:   01/03/17 2045 01/04/17 0400 01/04/17 0737 01/04/17 0854  BP: (!) 121/96 (!) 133/96  (!) 136/110  Pulse: 86  71 90  Resp: (!) 21     Temp: 98.4 F (36.9 C) 98 F (36.7 C)    TempSrc: Oral Oral    SpO2: 96%     Weight:  97.4 kg (214 lb 12.8 oz)    Height:       General: Pt is alert, awake, not in acute distress Cardiovascular: RRR, S1/S2 +, no rubs, no gallops Respiratory: CTA bilaterally, no wheezing, no rhonchi; Unlabored  breathing Abdominal: Soft, NT, ND, bowel sounds + Extremities: no edema, no cyanosis  The results of significant diagnostics from this hospitalization (including imaging, microbiology, ancillary and laboratory) are listed below for reference.    Microbiology: No results found for this or any previous visit (from the past 240 hour(s)).   Labs: BNP (last 3 results) No results for input(s): BNP in the last 8760 hours. Basic Metabolic Panel: Recent Labs  Lab 01/03/17 0349 01/04/17 0348  NA 135 138  K 3.5 3.8  CL 100* 103  CO2 24 26  GLUCOSE 105* 111*  BUN 12 14  CREATININE 0.91 1.03  CALCIUM 9.1 9.1  MG  --  1.8  PHOS  --  3.3   Liver Function Tests: Recent Labs  Lab 01/03/17 0349 01/04/17 0348  AST 74* 66*  ALT 86* 81*  ALKPHOS 155* 141*  BILITOT 1.7* 1.5*  PROT 6.4* 6.1*  ALBUMIN 3.9 3.7   No results for input(s): LIPASE, AMYLASE in the last 168 hours. No results for input(s): AMMONIA in the last 168 hours. CBC: Recent Labs  Lab 01/03/17 0349 01/04/17 0348  WBC 4.7 5.1  NEUTROABS  --  2.8  HGB 15.8 16.0  HCT 45.3 46.1  MCV 87.6 89.3  PLT 226 202   Cardiac Enzymes: Recent Labs  Lab 01/02/17 2204 01/03/17 0949 01/03/17 1600 01/03/17 2026  TROPONINI <0.03 <0.03 <0.03 <0.03   BNP: Invalid input(s): POCBNP CBG: No results for input(s): GLUCAP in the last 168 hours. D-Dimer No results for input(s): DDIMER in the last 72 hours. Hgb A1c Recent Labs    01/03/17 0349  HGBA1C 5.5   Lipid Profile Recent Labs    01/03/17 0349  CHOL 170  HDL 62  LDLCALC 81  TRIG 135  CHOLHDL 2.7   Thyroid function studies No results for input(s): TSH, T4TOTAL, T3FREE, THYROIDAB in the last 72 hours.  Invalid input(s): FREET3 Anemia work up No results for input(s): VITAMINB12, FOLATE, FERRITIN, TIBC, IRON, RETICCTPCT in the last 72 hours. Urinalysis No results  found for: COLORURINE, APPEARANCEUR, [place], [place], GLUCOSEU, HGBUR, BILIRUBINUR, KETONESUR,  PROTEINUR, UROBILINOGEN, NITRITE, LEUKOCYTESUR Sepsis Labs Invalid input(s): PROCALCITONIN,  WBC,  LACTICIDVEN Microbiology No results found for this or any previous visit (from the past 240 hour(s)).  Time coordinating discharge: 35 minutes  SIGNED:  Kerney Elbe, DO [facility] 01/04/2017, 2:50 PM Pager [phone]  If 7PM-7AM, please contact night-coverage [email or web address] Password [email or web address]

## 2017-02-04 DIAGNOSIS — F10231 Alcohol dependence with withdrawal delirium: Secondary | ICD-10-CM | POA: Diagnosis not present

## 2017-02-04 DIAGNOSIS — I10 Essential (primary) hypertension: Secondary | ICD-10-CM | POA: Diagnosis not present

## 2017-02-05 DIAGNOSIS — F10231 Alcohol dependence with withdrawal delirium: Secondary | ICD-10-CM | POA: Diagnosis not present

## 2017-02-05 DIAGNOSIS — I10 Essential (primary) hypertension: Secondary | ICD-10-CM | POA: Diagnosis not present

## 2018-06-18 ENCOUNTER — Inpatient Hospital Stay (HOSPITAL_COMMUNITY)
Admission: AD | Admit: 2018-06-18 | Discharge: 2018-06-25 | DRG: 896 | Disposition: A | Discharge status: Home / Self Care | Payer: BLUE CROSS/BLUE SHIELD | Source: Other Acute Inpatient Hospital | Attending: Student | Admitting: Student

## 2018-06-18 DIAGNOSIS — F332 Major depressive disorder, recurrent severe without psychotic features: Secondary | ICD-10-CM | POA: Diagnosis present

## 2018-06-18 DIAGNOSIS — M5126 Other intervertebral disc displacement, lumbar region: Secondary | ICD-10-CM | POA: Diagnosis present

## 2018-06-18 DIAGNOSIS — K08409 Partial loss of teeth, unspecified cause, unspecified class: Secondary | ICD-10-CM | POA: Diagnosis not present

## 2018-06-18 DIAGNOSIS — F10239 Alcohol dependence with withdrawal, unspecified: Secondary | ICD-10-CM | POA: Diagnosis present

## 2018-06-18 DIAGNOSIS — K7011 Alcoholic hepatitis with ascites: Secondary | ICD-10-CM | POA: Diagnosis present

## 2018-06-18 DIAGNOSIS — Z978 Presence of other specified devices: Secondary | ICD-10-CM

## 2018-06-18 DIAGNOSIS — R748 Abnormal levels of other serum enzymes: Secondary | ICD-10-CM | POA: Diagnosis present

## 2018-06-18 DIAGNOSIS — M5127 Other intervertebral disc displacement, lumbosacral region: Secondary | ICD-10-CM | POA: Diagnosis present

## 2018-06-18 DIAGNOSIS — D6959 Other secondary thrombocytopenia: Secondary | ICD-10-CM | POA: Diagnosis present

## 2018-06-18 DIAGNOSIS — Z79899 Other long term (current) drug therapy: Secondary | ICD-10-CM

## 2018-06-18 DIAGNOSIS — Z7982 Long term (current) use of aspirin: Secondary | ICD-10-CM

## 2018-06-18 DIAGNOSIS — D638 Anemia in other chronic diseases classified elsewhere: Secondary | ICD-10-CM | POA: Diagnosis not present

## 2018-06-18 DIAGNOSIS — E876 Hypokalemia: Secondary | ICD-10-CM | POA: Diagnosis not present

## 2018-06-18 DIAGNOSIS — Z6832 Body mass index (BMI) 32.0-32.9, adult: Secondary | ICD-10-CM

## 2018-06-18 DIAGNOSIS — J9811 Atelectasis: Secondary | ICD-10-CM | POA: Diagnosis present

## 2018-06-18 DIAGNOSIS — Z720 Tobacco use: Secondary | ICD-10-CM | POA: Diagnosis not present

## 2018-06-18 DIAGNOSIS — Z4659 Encounter for fitting and adjustment of other gastrointestinal appliance and device: Secondary | ICD-10-CM

## 2018-06-18 DIAGNOSIS — F10231 Alcohol dependence with withdrawal delirium: Principal | ICD-10-CM

## 2018-06-18 DIAGNOSIS — G4700 Insomnia, unspecified: Secondary | ICD-10-CM | POA: Diagnosis present

## 2018-06-18 DIAGNOSIS — K760 Fatty (change of) liver, not elsewhere classified: Secondary | ICD-10-CM | POA: Diagnosis present

## 2018-06-18 DIAGNOSIS — Z20828 Contact with and (suspected) exposure to other viral communicable diseases: Secondary | ICD-10-CM | POA: Diagnosis present

## 2018-06-18 DIAGNOSIS — R7989 Other specified abnormal findings of blood chemistry: Secondary | ICD-10-CM

## 2018-06-18 DIAGNOSIS — Z7689 Persons encountering health services in other specified circumstances: Secondary | ICD-10-CM

## 2018-06-18 DIAGNOSIS — I10 Essential (primary) hypertension: Secondary | ICD-10-CM | POA: Diagnosis present

## 2018-06-18 DIAGNOSIS — E222 Syndrome of inappropriate secretion of antidiuretic hormone: Secondary | ICD-10-CM | POA: Diagnosis present

## 2018-06-18 DIAGNOSIS — Z590 Homelessness: Secondary | ICD-10-CM | POA: Diagnosis not present

## 2018-06-18 DIAGNOSIS — R200 Anesthesia of skin: Secondary | ICD-10-CM | POA: Diagnosis not present

## 2018-06-18 DIAGNOSIS — Z9079 Acquired absence of other genital organ(s): Secondary | ICD-10-CM

## 2018-06-18 DIAGNOSIS — F10939 Alcohol use, unspecified with withdrawal, unspecified: Secondary | ICD-10-CM | POA: Diagnosis present

## 2018-06-18 DIAGNOSIS — R945 Abnormal results of liver function studies: Secondary | ICD-10-CM

## 2018-06-18 DIAGNOSIS — N400 Enlarged prostate without lower urinary tract symptoms: Secondary | ICD-10-CM | POA: Diagnosis present

## 2018-06-18 DIAGNOSIS — E871 Hypo-osmolality and hyponatremia: Secondary | ICD-10-CM | POA: Diagnosis not present

## 2018-06-18 DIAGNOSIS — F10230 Alcohol dependence with withdrawal, uncomplicated: Secondary | ICD-10-CM | POA: Diagnosis not present

## 2018-06-18 DIAGNOSIS — K859 Acute pancreatitis without necrosis or infection, unspecified: Secondary | ICD-10-CM | POA: Diagnosis present

## 2018-06-18 DIAGNOSIS — R188 Other ascites: Secondary | ICD-10-CM

## 2018-06-18 DIAGNOSIS — K7010 Alcoholic hepatitis without ascites: Secondary | ICD-10-CM | POA: Diagnosis not present

## 2018-06-18 MED ORDER — HEPARIN SODIUM (PORCINE) 5000 UNIT/ML IJ SOLN
5000.0000 [IU] | Freq: Three times a day (TID) | INTRAMUSCULAR | Status: DC
  Administered 2018-06-19: 05:00:00 5000 [IU] via SUBCUTANEOUS
  Filled 2018-06-18: qty 1

## 2018-06-18 MED ORDER — FOLIC ACID 5 MG/ML IJ SOLN: 1.0000 mg | Freq: Every day | INTRAMUSCULAR | Status: DC

## 2018-06-18 MED ORDER — DEXMEDETOMIDINE HCL IN NACL 400 MCG/100ML IV SOLN
0.2000 ug/kg/h | INTRAVENOUS | Status: DC
  Administered 2018-06-19: 1 ug/kg/h via INTRAVENOUS
  Administered 2018-06-19: 20:00:00 0.5 ug/kg/h via INTRAVENOUS
  Administered 2018-06-19: 08:00:00 0.3 ug/kg/h via INTRAVENOUS
  Administered 2018-06-20: 02:00:00 0.5 ug/kg/h via INTRAVENOUS
  Filled 2018-06-18 (×5): qty 100

## 2018-06-18 MED ORDER — THIAMINE HCL 100 MG/ML IJ SOLN: 100.0000 mg | Freq: Every day | INTRAMUSCULAR | Status: DC

## 2018-06-18 MED ORDER — PANTOPRAZOLE SODIUM 40 MG IV SOLR
40.0000 mg | Freq: Every day | INTRAVENOUS | Status: DC
  Administered 2018-06-19: 40 mg via INTRAVENOUS
  Filled 2018-06-18: qty 40

## 2018-06-18 NOTE — H&P (Addendum)
..   NAME:  Christopher Rogers, MRN:  [id number], DOB:  June 01, 1960, LOS: 0 ADMISSION DATE:  06/18/2018, CONSULTATION DATE:  06/18/2018 REFERRING MD:  Arlyss Repress DO, CHIEF COMPLAINT:  Altered Mental Status    Brief History   58 year old homeless male with a past medical history for hypertension, s/p prostatectomy, alcohol abuse, delirium tremens, polysubstance abuse, and depression is from [place] and acute alcohol withdrawal.  PUI for COVID-19. Testing pending. History of present illness   (History obtained from EMR, transfer paperwork and account of other providers)  58 year old homeless male with a past medical history of HTN,s/p prostatectomy, alcohol abuse, delirium tremens, polysubstance abuse and depression presenting from [place] with altered mental status. Per their documentation pt stated that he was a recovering alcoholic, he reports that he had been drinking 1 large bottle of wine daily for the past three weeks. He stated that he has been unable to get out of bed for the past 4 days because of bilateral leg numbness. He did report shortness of breath within the last 48 hrs. During the initial evaluation pt stated that he saw a gorilla on the wall. His last drink was three days prior to presentation. He reports that his drinking was precipitated by the recent loss of a relationshipHe was found by EMS down in his own feces and urine.   Upon arrival to [facility] Pt was Hypertensive BP 170/170mHg and HR in 130 s he was tremulous and incoherent. He was nauseous had vomiting episodes in the ED and experienced visual hallucinations as described above.  He was noted to be alert and oriented at 16:23 and able to do serial additions at 16:29. He quickly declined and received multiple doses of Ativan ( total of 13 mg) in the ED for his acute alcohol withdrawal with only mild to no improvement. [facility] of [facility] need for Dexmetomidine [facility]  Upon arrival: Given his history, unknown exposure/contacts and lymphopenia pt placed on contact and droplet precautions pending confirmation of COVID negative status.  Pt verbal but not coherent. Required increased titration of Precedex. COVID   Past Medical History  ..Marland KitchenActive Ambulatory Problems    Diagnosis Date Noted  . Back pain 04/20/2013  . BPH (benign prostatic hyperplasia) 12/07/2014  . Polysubstance abuse ([place] 09/12/2015  . MDD (major depressive disorder), recurrent severe, without psychosis ([place] 09/12/2015  . Chest pain 01/02/2017  . ETOH abuse 01/03/2017  . Essential hypertension 01/03/2017  . Hyperbilirubinemia 01/03/2017  . Abnormal LFTs 01/03/2017  . Anxiety and depression 01/03/2017   Resolved Ambulatory Problems    Diagnosis Date Noted  . No Resolved Ambulatory Problems   Past Medical History:  Diagnosis Date  . ADD (attention deficit hyperactivity disorder, inattentive type)   . Anxiety   . Arthritis   . Depression   . Insomnia   . Morbid obesity ([place]     Significant Hospital Events   Acute ETOH withdrawal >>>>>started on Precedex  Consults:  None   Procedures:  none  Significant Diagnostic Tests:  [facility]  WBC 13.6 Lymph 1 band 14 Na 129 Bicarb 21 Chloride 82 AG 31 BUN 23 Cr 1.2  Calcium 8.1 T bili 6.4 D. Bili 4.8  AST 592 ALT 185 Alk phos 276 CK 452 Mg 1.5 Lipase 2671 PT 15.6 INR 1.5 Ammonia <9 Protein 3+ Ketones 2 LA 5.4   [facility] 84 bpm QTc  449 PR 130 QRS 80  Significant Imaging:  CT Abdomen/Pelvis:   1. Acute pancreatitis. Heterogeneous pancreatic enhancement with hypoenhancement in the head and body, most likely due to edema/inflammation, though pancreatic ischemia/early necrosis can have a similar appearance. Follow-up CT abdomen after treatment of the acute pancreatitis is recommended to exclude pancreatic necrosis. No evidence of pseudocyst. 2. Severe diffuse hepatic  steatosis and mild hepatomegaly. No hepatic parenchymal masses. 3. High attenuation bile in the gallbladder, likely sludge. No evidence of cholelithiasis or acute cholecystitis. 4. Small amount of ascites dependently in the pelvis and in the paracolic gutters. 5. TURP defect with prior resection of the mass in the prostate gland. No evidence of tumor recurrence. 6. Multilevel degenerative disc disease, spondylosis and facet degenerative changes involving the lumbar spine with a disc extrusion at L5-S1 and superior migration of a desiccated disc fragment behind L5. 7. Incidental finding of extensive retroperitoneal lipomatosis.  CXR FINDING: Hypoventilation with decreased lung volume and bibasilar atelectasis. Negative for heart failure. Negative for pleural effusion. Heart size within normal limits. No acute skeletal abnormality.   Micro Data:  COVID-19 @[place] negative Repeated on arrival:  MRSA PCR negative  Antimicrobials:  none   Interim history/subjective:  Pt received the following per RN report:  4L IVF 0.9% NS 1L Banana bag (NS base) x1 (was completed at [facility]) Mag IV 13 mg ativan total  He received IV contrast for CTA/P 0.1 mg Clonidine >>> 1800 Zofran 4mg Iv, Phenergan 12.5mg and Bentyl 20mg  Vitals:HR 84 NSR BP Objective   There were no vitals taken for this visit.       No intake or output data in the 24 hours ending 06/18/18 2354 There were no vitals filed for this visit.  Examination: General: sleeping in no distress. Awakens with verbal stimuli  HENT: normocephalic atraumatic w/ nasal cannula, + mild tongue fasciculations Lungs: decreased at bases, no wheezing mild crackles b/l Cardiovascular: S1 and S2 normal rate and rhythm no rub or gallop Abdomen: tympanic and distended soft abdomen on palpation of RUQ and epigastrium he involuntarily guards. hypoactive BS Extremities:no edema no deformity SCDs in place Neuro: awake not oriented no focal  deficit + visual hallucination + tremor  Intact sensation in b/l lower extremities. No spinal tenderness on exam. Up-going Babinski b/l Rectal tone on DRE is present but decreased. GU: condom cath, no urine. erythematous confluent rash over the groin and buttocks Skin: ecchymoses on the upper left flank. Erythema over the left elbow and left knee. erythematous confluent rash over the groin and buttocks   Assessment & Plan:  1. Acute ETOH withdrawal: Last drink was >72hrs ago Elevated CIWA score not responsive to Ativan Started on Precedex/Dexmedetomidine at [place] Currently at 0.7 mcg/kg/hr  Plan: Continue on Precedex 4mcg/ml Dose range 0.2mcg/kg/hr-1.5 mcg/kg/hr Titrate as needed to achieve Goal RASS of -1 to 0 Continue on daily Thiamine and Folate If LFTs normalize may benefit from Librium  2. Sepsis: MAPs have been >65mmHg Received no abx at [place] Possible sources: Pancreatitis typically doesn't require abx, pt also has an extruding disc ( no fluid collection this was on CT), no h/o cirrhosis but alcoholic hepatitis w/ a small amt of ascites not enough for diagnostic paracentesis (?SBP) Plan: Will send PCT Blood cx x2 Empiric coverage w/ Vanc and cefepime if PCT elevated  3. Acute Pancreatitis (moderate in severity) Etiology most likely from ETOH abuse Lipase 2672 BUN 23 Calcium 8.1 Electrolytes not indicative of a severe process Abdomen distended on exam   S/p 5L IVF prior to transfer No signs of necrosis or abscess formation on CT WBC 9.2 on repeat labs  Plan: Continue with IVF>>>> LR  Placed a NGT to Decompress abdomen   No need for antibiotics  4. B/l Atelectasis  C/o SOB on presentation to [facility] Abdomen  ABG: 7.4/32/83/20 on 2L [place] Plan: Continue on [place] Titrate to keep O2 sat >92% When pt more awake and can follow commands>> incentive spirometry  5. PUI COVID: While test [facility] was negative Given the h/o unknown exposures/contacts and lymphopenia  Plan: pt placed on contact and droplet precautions pending confirmation of COVID negative status with in house test.  6. Acute Encephalopathy: Secondary to ETOH withdrawal On presentation still had visual hallucinations Plan: Continue neurochecks Aspiration and Fall precautions Continues on Precedex No focal deficits and no h/o hitting his head - no need for [facility] w/o contrast at this time.  7. Alcoholic hepatitis (Severe diffuse hepatic steatosis secondary to ETOH abuse) Pt also has Sludge in gallbladder bout no signs of an obstructing stone. no evidence of cirrhotic changes on imaging  small amt of ascites AST 592 ALT 185 Alk phos 276 Based on labs from [facility] Disc Fn: 27 Plan: Trend LFTs If Disc Fn >32 start glucocorticoids   If >32 start glucocorticoid Rx  8. Disc extrusion L5-S1causing B/l Leg numbness: his initial complaint and why he was unable to get out of the bed for 4-5 days.  Pt on Precedex for withdrawal it was held for neuro exam Pt has no pain response on straight leg raise (R&L) Imaging demonstrates a disc extrusion at L5-S1 and superior migration of a desiccated disc fragment behind L5.  No reported or witnessed episodes of incontinence  No saddle anesthesia Pt maintains sensation in his lower extremities Rectal tone on DRE is present but decreased Plan: Continue with neuro checks Q1 and daily exams Any change in lower ext sensation, muscle tone  Monitor closely for any changes in neuro exam regarding this finding  Pt may warrant MRI Lumbar spine w/ consultation to specialist.   9. H/o BPH s/p TURP No urine output since arrival despite 5L IVF May still have some urinary retenton Plan: Place an indwelling foley catheter   Best practice:  Diet: NPO Pain/Anxiety/Delirium protocol (if indicated): Precedex gtt VAP protocol (if indicated): not intubated DVT prophylaxis: Heparin [place] and SCDs GI prophylaxis: Protonix daily Glucose control: h/o  prediabetes if BG >135m/dl will start ISS Mobility: bedrest on fall precautions Code Status: Full Family Communication: no family  Disposition: ICU  Labs   CBC: No results for input(s): WBC, NEUTROABS, HGB, HCT, MCV, PLT in the last 168 hours.  Basic Metabolic Panel: No results for input(s): NA, K, CL, CO2, GLUCOSE, BUN, CREATININE, CALCIUM, MG, PHOS in the last 168 hours. GFR: CrCl cannot be calculated (Patient's most recent lab result is older than the maximum 21 days allowed.). No results for input(s): PROCALCITON, WBC, LATICACIDVEN in the last 168 hours.  Liver Function Tests: No results for input(s): AST, ALT, ALKPHOS, BILITOT, PROT, ALBUMIN in the last 168 hours. No results for input(s): LIPASE, AMYLASE in the last 168 hours. No results for input(s): AMMONIA in the last 168 hours.  ABG No results found for: PHART, PCO2ART, PO2ART, HCO3, TCO2, ACIDBASEDEF, O2SAT   Coagulation Profile: No results for input(s): INR, PROTIME in the last 168 hours.  Cardiac Enzymes: No results for input(s): CKTOTAL, CKMB, CKMBINDEX, TROPONINI in the last 168 hours.  HbA1C: Hgb A1c MFr  Bld  Date/Time Value Ref Range Status  01/03/2017 03:49 AM 5.5 4.8 - 5.6 % Final    Comment:    (NOTE) Pre diabetes:          5.7%-6.4% Diabetes:              >6.4% Glycemic control for   <7.0% adults with diabetes   09/13/2015 06:42 AM 5.6 4.8 - 5.6 % Final    Comment:    (NOTE)         Pre-diabetes: 5.7 - 6.4         Diabetes: >6.4         Glycemic control for adults with diabetes: <7.0     CBG: No results for input(s): GLUCAP in the last 168 hours.  Review of Systems:   ..Review of Systems  Unable to perform ROS: Mental status change     Past Medical History  He,  has a past medical history of ADD (attention deficit hyperactivity disorder, inattentive type), Anxiety, Arthritis, Chest pain, Depression, Essential hypertension, Insomnia, and Morbid obesity (HCC).   Surgical History     Past Surgical History:  Procedure Laterality Date  . BICEPS TENDON REPAIR Left   . COLONOSCOPY    . EYE SURGERY    . LUMBAR LAMINECTOMY/DECOMPRESSION MICRODISCECTOMY Right 04/20/2013   Procedure: L3-L4 DECOMPRESSION DISCECTOMY (1 LEVEL);  Surgeon: Dahari Brooks, MD;  Location: [facility] OR;  Service: Orthopedics;  Laterality: Right;  . PROSTATECTOMY N/A 12/07/2014   Procedure: OPEN SUPRAPUBIC PROSTATECTOMY ;  Surgeon: Sigmund Tannenbaum, MD;  Location: [facility] ORS;  Service: Urology;  Laterality: N/A;  . SHOULDER ARTHROSCOPY Left    clavical repair x2  . VARICOSE VEIN SURGERY Bilateral   . WISDOM TOOTH EXTRACTION       Social History   reports that he has never smoked. His smokeless tobacco use includes chew. He reports current alcohol use. He reports that he does not use drugs.   Family History   His family history includes Alzheimer's disease in his father; Other in his mother.   Allergies No Known Allergies   Home Medications  Prior to Admission medications   Medication Sig Start Date End Date Taking? Authorizing Provider  amLODipine (NORVASC) 10 MG tablet Take 10 mg daily by mouth. 12/24/16   [provider]  aspirin EC 81 MG EC tablet Take 1 tablet (81 mg total) daily by mouth. 01/05/17   Sheikh, Omair Latif, DO  carvedilol (COREG) 12.5 MG tablet Take 1 tablet (12.5 mg total) 2 (two) times daily with a meal by mouth. 01/04/17   Sheikh, Omair Latif, DO  escitalopram (LEXAPRO) 10 MG tablet Take 10 mg by mouth at bedtime.    [provider]  folic acid (FOLVITE) 1 MG tablet Take 1 tablet (1 mg total) daily by mouth. 01/05/17   Sheikh, Omair Latif, DO  Multiple Vitamin (MULTIVITAMIN WITH MINERALS) TABS tablet Take 1 tablet daily by mouth. 01/05/17   Sheikh, Omair Latif, DO  thiamine 100 MG tablet Take 1 tablet (100 mg total) daily by mouth. 01/05/17   Sheikh, Omair Latif, DO   STAFF NOTE  I, Dr Kristen Scatliffe have personally reviewed patient's available data, including  medical history, events of note, physical examination and test results as part of my evaluation. I have discussed with other care providers such as pharmacist, RN and Elink.  In addition,  I personally evaluated patient The patient is critically ill with multiple organ systems failure and requires high complexity decision making for   assessment and support, frequent evaluation and titration of therapies, application of advanced monitoring technologies and extensive interpretation of multiple databases.   Critical Care Time devoted to patient care services described in this note is  90 Minutes. This time reflects time of care of this signee Dr Kristen Scatliffe. This critical care time does not reflect procedure time, or teaching time or supervisory time  but could involve care discussion time extensive review of outside records.   CC TIME: 90  minutes CODE STATUS: FULL DISPOSITION: ICU PROGNOSIS: Guarded   Dr. Kristen Scatliffe Pulmonary Critical Care Medicine  06/18/2018 11:58 PM   Critical care time: 55 mins        

## 2018-06-19 ENCOUNTER — Inpatient Hospital Stay (HOSPITAL_COMMUNITY): Payer: BLUE CROSS/BLUE SHIELD

## 2018-06-19 DIAGNOSIS — K760 Fatty (change of) liver, not elsewhere classified: Secondary | ICD-10-CM | POA: Diagnosis present

## 2018-06-19 DIAGNOSIS — M5126 Other intervertebral disc displacement, lumbar region: Secondary | ICD-10-CM | POA: Diagnosis present

## 2018-06-19 DIAGNOSIS — K7011 Alcoholic hepatitis with ascites: Secondary | ICD-10-CM

## 2018-06-19 DIAGNOSIS — Z4659 Encounter for fitting and adjustment of other gastrointestinal appliance and device: Secondary | ICD-10-CM

## 2018-06-19 LAB — LIPID PANEL
Cholesterol: 175 mg/dL (ref 0–200)
HDL: 34 mg/dL — ABNORMAL LOW (ref >40)
LDL Cholesterol: 96 mg/dL (ref 0–99)
Total CHOL/HDL Ratio: 5.1 RATIO
Triglycerides: 227 mg/dL — ABNORMAL HIGH (ref <150)
VLDL: 45 mg/dL — ABNORMAL HIGH (ref 0–40)

## 2018-06-19 LAB — CBC WITH DIFFERENTIAL/PLATELET
Abs Immature Granulocytes: 0.11 10*3/uL — ABNORMAL HIGH (ref 0.00–0.07)
Basophils Absolute: 0 10*3/uL (ref 0.0–0.1)
Basophils Relative: 0 %
Eosinophils Absolute: 0 10*3/uL (ref 0.0–0.5)
Eosinophils Relative: 0 %
HCT: 38.3 % — ABNORMAL LOW (ref 39.0–52.0)
Hemoglobin: 12.8 g/dL — ABNORMAL LOW (ref 13.0–17.0)
Immature Granulocytes: 1 %
Lymphocytes Relative: 4 %
Lymphs Abs: 0.4 10*3/uL — ABNORMAL LOW (ref 0.7–4.0)
MCH: 29.9 pg (ref 26.0–34.0)
MCHC: 33.4 g/dL (ref 30.0–36.0)
MCV: 89.5 fL (ref 80.0–100.0)
Monocytes Absolute: 0.5 10*3/uL (ref 0.1–1.0)
Monocytes Relative: 6 %
Neutro Abs: 8.2 10*3/uL — ABNORMAL HIGH (ref 1.7–7.7)
Neutrophils Relative %: 89 %
Platelets: 48 10*3/uL — ABNORMAL LOW (ref 150–400)
RBC: 4.28 MIL/uL (ref 4.22–5.81)
RDW: 15.1 % (ref 11.5–15.5)
WBC: 9.2 10*3/uL (ref 4.0–10.5)
nRBC: 0 % (ref 0.0–0.2)

## 2018-06-19 LAB — URINALYSIS, ROUTINE W REFLEX MICROSCOPIC
Glucose, UA: NEGATIVE mg/dL
Ketones, ur: 20 mg/dL — AB
Nitrite: NEGATIVE
Protein, ur: 100 mg/dL — AB
Specific Gravity, Urine: >1.046 — ABNORMAL HIGH (ref 1.005–1.030)
pH: 6 (ref 5.0–8.0)

## 2018-06-19 LAB — D-DIMER, QUANTITATIVE: D-Dimer, Quant: >20 ug/mL-FEU — ABNORMAL HIGH (ref 0.00–0.50)

## 2018-06-19 LAB — POCT I-STAT 7, (LYTES, BLD GAS, ICA,H+H)
Acid-base deficit: 3 mmol/L — ABNORMAL HIGH (ref 0.0–2.0)
Bicarbonate: 20 mmol/L (ref 20.0–28.0)
Calcium, Ion: 0.92 mmol/L — ABNORMAL LOW (ref 1.15–1.40)
HCT: 37 % — ABNORMAL LOW (ref 39.0–52.0)
Hemoglobin: 12.6 g/dL — ABNORMAL LOW (ref 13.0–17.0)
O2 Saturation: 96 %
Patient temperature: 100.8
Potassium: 3.7 mmol/L (ref 3.5–5.1)
Sodium: 134 mmol/L — ABNORMAL LOW (ref 135–145)
TCO2: 21 mmol/L — ABNORMAL LOW (ref 22–32)
pCO2 arterial: 32.3 mmHg (ref 32.0–48.0)
pH, Arterial: 7.405 (ref 7.350–7.450)
pO2, Arterial: 83 mmHg (ref 83.0–108.0)

## 2018-06-19 LAB — COMPREHENSIVE METABOLIC PANEL
ALT: 113 U/L — ABNORMAL HIGH (ref 0–44)
AST: 262 U/L — ABNORMAL HIGH (ref 15–41)
Albumin: 2.6 g/dL — ABNORMAL LOW (ref 3.5–5.0)
Alkaline Phosphatase: 166 U/L — ABNORMAL HIGH (ref 38–126)
Anion gap: 17 — ABNORMAL HIGH (ref 5–15)
BUN: 19 mg/dL (ref 6–20)
CO2: 18 mmol/L — ABNORMAL LOW (ref 22–32)
Calcium: 6.8 mg/dL — ABNORMAL LOW (ref 8.9–10.3)
Chloride: 100 mmol/L (ref 98–111)
Creatinine, Ser: 1.2 mg/dL (ref 0.61–1.24)
GFR calc Af Amer: >60 mL/min (ref >60)
GFR calc non Af Amer: >60 mL/min (ref >60)
Glucose, Bld: 98 mg/dL (ref 70–99)
Potassium: 4.6 mmol/L (ref 3.5–5.1)
Sodium: 135 mmol/L (ref 135–145)
Total Bilirubin: 6.2 mg/dL — ABNORMAL HIGH (ref 0.3–1.2)
Total Protein: 5.2 g/dL — ABNORMAL LOW (ref 6.5–8.1)

## 2018-06-19 LAB — PROCALCITONIN: Procalcitonin: 0.99 ng/mL

## 2018-06-19 LAB — LACTIC ACID, PLASMA: Lactic Acid, Venous: 1.9 mmol/L (ref 0.5–1.9)

## 2018-06-19 LAB — GLUCOSE, CAPILLARY
Glucose-Capillary: 107 mg/dL — ABNORMAL HIGH (ref 70–99)
Glucose-Capillary: 166 mg/dL — ABNORMAL HIGH (ref 70–99)

## 2018-06-19 LAB — PROTIME-INR
INR: 1.3 — ABNORMAL HIGH (ref 0.8–1.2)
Prothrombin Time: 16.2 seconds — ABNORMAL HIGH (ref 11.4–15.2)

## 2018-06-19 LAB — MRSA PCR SCREENING: MRSA by PCR: NEGATIVE

## 2018-06-19 LAB — SARS CORONAVIRUS 2 BY RT PCR (HOSPITAL ORDER, PERFORMED IN ~~LOC~~ HOSPITAL LAB): SARS Coronavirus 2: NEGATIVE

## 2018-06-19 LAB — PHOSPHORUS: Phosphorus: 3 mg/dL (ref 2.5–4.6)

## 2018-06-19 LAB — MAGNESIUM: Magnesium: 1.8 mg/dL (ref 1.7–2.4)

## 2018-06-19 LAB — ABO/RH: ABO/RH(D): O POS

## 2018-06-19 LAB — HIV ANTIBODY (ROUTINE TESTING W REFLEX): HIV Screen 4th Generation wRfx: NONREACTIVE

## 2018-06-19 LAB — LACTATE DEHYDROGENASE: LDH: 1339 U/L — ABNORMAL HIGH (ref 98–192)

## 2018-06-19 LAB — APTT: aPTT: 32 seconds (ref 24–36)

## 2018-06-19 MED ORDER — FOLIC ACID 5 MG/ML IJ SOLN
1.0000 mg | Freq: Every day | INTRAMUSCULAR | Status: DC
  Filled 2018-06-19: qty 0.2

## 2018-06-19 MED ORDER — VANCOMYCIN HCL 10 G IV SOLR
1250.0000 mg | Freq: Two times a day (BID) | INTRAVENOUS | Status: DC
  Administered 2018-06-19: 06:00:00 1250 mg via INTRAVENOUS
  Filled 2018-06-19 (×3): qty 1250

## 2018-06-19 MED ORDER — PHENOBARBITAL SODIUM 65 MG/ML IJ SOLN
65.0000 mg | Freq: Once | INTRAMUSCULAR | Status: AC
  Administered 2018-06-19: 11:00:00 65 mg via INTRAVENOUS
  Filled 2018-06-19: qty 1

## 2018-06-19 MED ORDER — LACTATED RINGERS IV SOLN
INTRAVENOUS | Status: DC
  Administered 2018-06-19 – 2018-06-20 (×4): via INTRAVENOUS

## 2018-06-19 MED ORDER — SODIUM CHLORIDE 0.9 % IV SOLN
2.0000 g | Freq: Three times a day (TID) | INTRAVENOUS | Status: DC
  Administered 2018-06-19: 2 g via INTRAVENOUS
  Filled 2018-06-19 (×4): qty 2

## 2018-06-19 MED ORDER — THIAMINE HCL 100 MG/ML IJ SOLN: 100.0000 mg | Freq: Every day | INTRAMUSCULAR | Status: DC

## 2018-06-19 MED ORDER — THIAMINE HCL 100 MG/ML IJ SOLN
100.0000 mg | Freq: Every day | INTRAMUSCULAR | Status: DC
  Administered 2018-06-19 – 2018-06-20 (×2): 100 mg via INTRAVENOUS
  Filled 2018-06-19 (×2): qty 2

## 2018-06-19 MED ORDER — ORAL CARE MOUTH RINSE
15.0000 mL | Freq: Two times a day (BID) | OROMUCOSAL | Status: DC
  Administered 2018-06-19 – 2018-06-20 (×4): 15 mL via OROMUCOSAL

## 2018-06-19 MED ORDER — IBUPROFEN 200 MG PO TABS
400.0000 mg | ORAL_TABLET | Freq: Once | ORAL | Status: AC
  Administered 2018-06-19: 400 mg via ORAL
  Filled 2018-06-19: qty 2

## 2018-06-19 MED ORDER — FOLIC ACID 5 MG/ML IJ SOLN
1.0000 mg | Freq: Every day | INTRAMUSCULAR | Status: DC
  Administered 2018-06-19 – 2018-06-20 (×2): 1 mg via INTRAVENOUS
  Filled 2018-06-19 (×3): qty 0.2

## 2018-06-19 NOTE — Progress Notes (Addendum)
Pharmacy Antibiotic Note  Christopher Rogers is a 58 y.o. male admitted on 06/18/2018 with alcohol withdrawal.  Pharmacy has been consulted for Vancomycin/Cefepime dosing for r/o sepsis. Procalcitonin is 0.99. WBC WNL. Renal function OK. Transfer from [place]; no anti-biotics received there.  Plan: Vancomycin 1250 mg IV q12h >>Estimated AUC: 511 Cefepime 2g IV q8h Trend WBC, temp, renal function  F/U infectious work-up Drug levels as indicated   Height: 6\' 1"  (185.4 cm) Weight: 225 lb 1.4 oz (102.1 kg) IBW/kg (Calculated) : 79.9  Temp (24hrs), Avg:100.8 F (38.2 C), Min:100.8 F (38.2 C), Max:100.8 F (38.2 C)  Recent Labs  Lab 06/19/18 0159  WBC 9.2  CREATININE 1.20  LATICACIDVEN 1.9    Estimated Creatinine Clearance: 83.3 mL/min (by C-G formula based on SCr of 1.2 mg/dL).    No Known Allergies   Narda Bonds 06/19/2018 4:00 AM

## 2018-06-19 NOTE — H&P (Signed)
..   NAME:  Christopher Rogers, MRN:  [id number], DOB:  01-23-1961, LOS: 1 ADMISSION DATE:  06/18/2018, CONSULTATION DATE:  06/18/2018 REFERRING MD:  Arlyss Repress DO, CHIEF COMPLAINT:  Altered Mental Status    Brief History   58 year old homeless male with a past medical history for hypertension, s/p prostatectomy, alcohol abuse, delirium tremens, polysubstance abuse, and depression is from [place] and acute alcohol withdrawal.  PUI for COVID-19. Testing negative . History of present illness   (History obtained from EMR, transfer paperwork and account of other providers)  58 year old homeless male with a past medical history of HTN,s/p prostatectomy, alcohol abuse, delirium tremens, polysubstance abuse and depression presenting from [place] with altered mental status.  Drinking heavily for past 3 weeks. Unable to get out of bed for past 4 days due to leg numbness.  Increased SOB for 48 h with visual hallucinations. Hypertensive and tremulous at [facility]. Continued symptoms despite 13mg  of Ativan. Transferred for initiation of Precedex.   Past Medical History  .Marland Kitchen Active Ambulatory Problems    Diagnosis Date Noted  . Back pain 04/20/2013  . BPH (benign prostatic hyperplasia) 12/07/2014  . Polysubstance abuse ([place]) 09/12/2015  . MDD (major depressive disorder), recurrent severe, without psychosis ([place]) 09/12/2015  . Chest pain 01/02/2017  . ETOH abuse 01/03/2017  . Essential hypertension 01/03/2017  . Hyperbilirubinemia 01/03/2017  . Abnormal LFTs 01/03/2017  . Anxiety and depression 01/03/2017   Resolved Ambulatory Problems    Diagnosis Date Noted  . No Resolved Ambulatory Problems   Past Medical History:  Diagnosis Date  . ADD (attention deficit hyperactivity disorder, inattentive type)   . Anxiety   . Arthritis   . Depression   . Insomnia   . Morbid obesity ([place])     Significant Hospital Events   Acute ETOH withdrawal >>>>>started on Precedex  Consults:  None    Procedures:  none  Significant Diagnostic Tests:    Significant Imaging:  CT Abdomen/Pelvis:   1. Acute pancreatitis. Heterogeneous pancreatic enhancement with hypoenhancement in the head and body, most likely due to edema/inflammation, though pancreatic ischemia/early necrosis can have a similar appearance. Follow-up CT abdomen after treatment of the acute pancreatitis is recommended to exclude pancreatic necrosis. No evidence of pseudocyst. 2. Severe diffuse hepatic steatosis and mild hepatomegaly. No hepatic parenchymal masses. 3. High attenuation bile in the gallbladder, likely sludge. No evidence of cholelithiasis or acute cholecystitis. 4. Small amount of ascites dependently in the pelvis and in the paracolic gutters. 5. TURP defect with prior resection of the mass in the prostate gland. No evidence of tumor recurrence. 6. Multilevel degenerative disc disease, spondylosis and facet degenerative changes involving the lumbar spine with a disc extrusion at L5-S1 and superior migration of a desiccated disc fragment behind L5. 7. Incidental finding of extensive retroperitoneal lipomatosis.  CXR FINDING: Hypoventilation with decreased lung volume and bibasilar atelectasis. Negative for heart failure. Negative for pleural effusion. Heart size within normal limits. No acute skeletal abnormality.  Micro Data:  COVID-19 @[place]  negative Repeated on arrival:  MRSA PCR negative  Antimicrobials:  none   Interim history/subjective:  For RN, patient is oriented and following commands.  Objective   Blood pressure (!) 136/93, pulse 79, temperature 97.6 F (36.4 C), temperature source Oral, resp. rate (!) 22, height 5\' 10"  (1.778 m), weight 101.1 kg, SpO2 100 %.    FiO2 (%):  [28 %] 28 %   Intake/Output Summary (Last 24 hours) at 06/19/2018 2878 Last data filed at 06/19/2018 0800 Gross per  24 hour  Intake 741.32 ml  Output 1290 ml  Net -548.68 ml   Filed Weights    06/18/18 2354 06/19/18 0415  Weight: 102.1 kg 101.1 kg    Examination: General: moderately obese. HENT: normocephalic atraumatic w/ nasal cannula, + mild tongue fasciculations Lungs: decreased at bases, no wheezing mild crackles b/l Cardiovascular: S1 and S2 normal rate and rhythm no rub or gallop Abdomen: tympanic and distended soft abdomen on palpation of RUQ and epigastrium he involuntarily guards. hypoactive BS Extremities:no edema no deformity SCDs in place Neuro: awake not oriented no focal deficit, no hallucinations. Strength and sensation normal. + formifications in legs. GU: condom cath, no urine. erythematous confluent rash over the groin and buttocks Skin: ecchymoses on the upper left flank. Erythema over the left elbow and left knee. erythematous confluent rash over the groin and buttocks   Assessment & Plan:  Critically ill due to Acute ETOH withdrawal requiring titration of Precedex: Last drink was >72hrs ago Elevated CIWA score not responsive to Ativan started adjunctive Precedex. Improved withdrawal symptoms but still present. Remains at risk for decompensation to DT. Phenobarbital load to allow slow taper. Continue symptom triggered lorazepam   Acute Pancreatitis (moderate in severity) Etiology most likely from ETOH abuse Bowel rest for 48h then progress diet.   .Alcoholic hepatitis (Severe diffuse hepatic steatosis secondary to ETOH abuse) Pt also has Sludge in gallbladder bout no signs of an obstructing stone. no evidence of cirrhotic changes on imaging  Based on labs from [facility] Disc Fn: 27 Trend LFTs If Disc Fn >32 start glucocorticoids  8. Disc extrusion L5-S1causing B/l Leg numbness: his initial complaint and why he was unable to get out of the bed for 4-5 days. Currently no severe neurological symptoms Follow exam   9. H/o BPH s/p TURP No urine output since arrival despite 5L IVF May still have some urinary [place] Place an indwelling  foley catheter   Best practice:  Diet: NPO Pain/Anxiety/Delirium protocol (if indicated): Precedex gtt VAP protocol (if indicated): not intubated DVT prophylaxis: Heparin [place] and SCDs GI prophylaxis: Protonix daily Glucose control: h/o prediabetes if BG >180mg /dl will start ISS Mobility: bedrest on fall precautions Code Status: Full Family Communication: no family  Disposition: ICU  Labs   CBC: Recent Labs  Lab 06/19/18 0028 06/19/18 0159  WBC  --  9.2  NEUTROABS  --  8.2*  HGB 12.6* 12.8*  HCT 37.0* 38.3*  MCV  --  89.5  PLT  --  48*    Basic Metabolic Panel: Recent Labs  Lab 06/19/18 0028 06/19/18 0159  NA 134* 135  K 3.7 4.6  CL  --  100  CO2  --  18*  GLUCOSE  --  98  BUN  --  19  CREATININE  --  1.20  CALCIUM  --  6.8*  MG  --  1.8  PHOS  --  3.0   GFR: Estimated Creatinine Clearance: 78.9 mL/min (by C-G formula based on SCr of 1.2 mg/dL). Recent Labs  Lab 06/19/18 0159  PROCALCITON 0.99  WBC 9.2  LATICACIDVEN 1.9    Liver Function Tests: Recent Labs  Lab 06/19/18 0159  AST 262*  ALT 113*  ALKPHOS 166*  BILITOT 6.2*  PROT 5.2*  ALBUMIN 2.6*   No results for input(s): LIPASE, AMYLASE in the last 168 hours. No results for input(s): AMMONIA in the last 168 hours.  ABG    Component Value Date/Time   PHART 7.405 06/19/2018 0028  PCO2ART 32.3 06/19/2018 0028   PO2ART 83.0 06/19/2018 0028   HCO3 20.0 06/19/2018 0028   TCO2 21 (L) 06/19/2018 0028   ACIDBASEDEF 3.0 (H) 06/19/2018 0028   O2SAT 96.0 06/19/2018 0028     Coagulation Profile: Recent Labs  Lab 06/19/18 0404  INR 1.3*    Cardiac Enzymes: No results for input(s): CKTOTAL, CKMB, CKMBINDEX, TROPONINI in the last 168 hours.  HbA1C: Hgb A1c MFr Bld  Date/Time Value Ref Range Status  01/03/2017 03:49 AM 5.5 4.8 - 5.6 % Final    Comment:    (NOTE) Pre diabetes:          5.7%-6.4% Diabetes:              >6.4% Glycemic control for   <7.0% adults with diabetes    09/13/2015 06:42 AM 5.6 4.8 - 5.6 % Final    Comment:    (NOTE)         Pre-diabetes: 5.7 - 6.4         Diabetes: >6.4         Glycemic control for adults with diabetes: <7.0     CBG: Recent Labs  Lab 06/19/18 0124  GLUCAP 107*   CRITICAL CARE Performed by: Kipp Brood   Total critical care time: 30 minutes  Critical care time was exclusive of separately billable procedures and treating other patients.  Critical care was necessary to treat or prevent imminent or life-threatening deterioration.  Critical care was time spent personally by me on the following activities: development of treatment plan with patient and/or surrogate as well as nursing, discussions with consultants, evaluation of patient's response to treatment, examination of patient, obtaining history from patient or surrogate, ordering and performing treatments and interventions, ordering and review of laboratory studies, ordering and review of radiographic studies, pulse oximetry, re-evaluation of patient's condition and participation in multidisciplinary rounds.  Kipp Brood, MD [facility] ICU Physician [place]  Pager: [phone] Mobile: [phone] After hours: [phone].

## 2018-06-19 NOTE — Progress Notes (Signed)
eLink Physician-Brief Progress Note Patient Name: Christopher Rogers DOB: 10/09/1960 MRN: [id number]   Date of Service  06/19/2018  HPI/Events of Note  Back Pain - Mild to moderate. AST = 262 and ALT = 113. Therefore, can't use Tylenol. Creatinine = 1.2.   eICU Interventions  Will order: 1. Mortrin 400 mg PO X 1 now.      Intervention Category Intermediate Interventions: Pain - evaluation and management  Sommer,Steven Eugene 06/19/2018, 8:26 PM

## 2018-06-20 DIAGNOSIS — F10231 Alcohol dependence with withdrawal delirium: Secondary | ICD-10-CM

## 2018-06-20 LAB — COMPREHENSIVE METABOLIC PANEL
ALT: 113 U/L — ABNORMAL HIGH (ref 0–44)
AST: 217 U/L — ABNORMAL HIGH (ref 15–41)
Albumin: 2.5 g/dL — ABNORMAL LOW (ref 3.5–5.0)
Alkaline Phosphatase: 158 U/L — ABNORMAL HIGH (ref 38–126)
Anion gap: 14 (ref 5–15)
BUN: 12 mg/dL (ref 6–20)
CO2: 21 mmol/L — ABNORMAL LOW (ref 22–32)
Calcium: 7.7 mg/dL — ABNORMAL LOW (ref 8.9–10.3)
Chloride: 103 mmol/L (ref 98–111)
Creatinine, Ser: 0.98 mg/dL (ref 0.61–1.24)
GFR calc Af Amer: >60 mL/min (ref >60)
GFR calc non Af Amer: >60 mL/min (ref >60)
Glucose, Bld: 137 mg/dL — ABNORMAL HIGH (ref 70–99)
Potassium: 3.4 mmol/L — ABNORMAL LOW (ref 3.5–5.1)
Sodium: 138 mmol/L (ref 135–145)
Total Bilirubin: 6.7 mg/dL — ABNORMAL HIGH (ref 0.3–1.2)
Total Protein: 5 g/dL — ABNORMAL LOW (ref 6.5–8.1)

## 2018-06-20 LAB — GLUCOSE, CAPILLARY
Glucose-Capillary: 119 mg/dL — ABNORMAL HIGH (ref 70–99)
Glucose-Capillary: 167 mg/dL — ABNORMAL HIGH (ref 70–99)
Glucose-Capillary: 168 mg/dL — ABNORMAL HIGH (ref 70–99)
Glucose-Capillary: 175 mg/dL — ABNORMAL HIGH (ref 70–99)

## 2018-06-20 LAB — PROTIME-INR
INR: 1.5 — ABNORMAL HIGH (ref 0.8–1.2)
Prothrombin Time: 17.5 seconds — ABNORMAL HIGH (ref 11.4–15.2)

## 2018-06-20 LAB — LIPASE, BLOOD: Lipase: 53 U/L — ABNORMAL HIGH (ref 11–51)

## 2018-06-20 MED ORDER — VITAMIN B-1 100 MG PO TABS
100.0000 mg | ORAL_TABLET | Freq: Every day | ORAL | Status: DC
  Filled 2018-06-20: qty 1

## 2018-06-20 MED ORDER — ESCITALOPRAM OXALATE 20 MG PO TABS
20.0000 mg | ORAL_TABLET | Freq: Every day | ORAL | Status: DC
  Administered 2018-06-20 – 2018-06-24 (×5): 20 mg via ORAL
  Filled 2018-06-20 (×6): qty 1

## 2018-06-20 MED ORDER — PANTOPRAZOLE SODIUM 40 MG PO TBEC
40.0000 mg | DELAYED_RELEASE_TABLET | Freq: Every day | ORAL | Status: DC
  Administered 2018-06-20 – 2018-06-25 (×6): 40 mg via ORAL
  Filled 2018-06-20 (×7): qty 1

## 2018-06-20 MED ORDER — CARVEDILOL 12.5 MG PO TABS
12.5000 mg | ORAL_TABLET | Freq: Two times a day (BID) | ORAL | Status: DC
  Administered 2018-06-20 – 2018-06-25 (×10): 12.5 mg via ORAL
  Filled 2018-06-20 (×12): qty 1

## 2018-06-20 MED ORDER — ADULT MULTIVITAMIN LIQUID CH
15.0000 mL | Freq: Every day | ORAL | Status: DC
  Administered 2018-06-20 – 2018-06-21 (×2): 15 mL
  Filled 2018-06-20 (×3): qty 15

## 2018-06-20 MED ORDER — POTASSIUM CHLORIDE CRYS ER 20 MEQ PO TBCR
20.0000 meq | EXTENDED_RELEASE_TABLET | ORAL | Status: AC
  Administered 2018-06-20 (×2): 20 meq via ORAL
  Filled 2018-06-20 (×2): qty 1

## 2018-06-20 MED ORDER — VITAMIN B-1 100 MG PO TABS
100.0000 mg | ORAL_TABLET | Freq: Every day | ORAL | Status: DC
  Administered 2018-06-21: 100 mg via ORAL
  Filled 2018-06-20: qty 1

## 2018-06-20 MED ORDER — FOLIC ACID 1 MG PO TABS
1.0000 mg | ORAL_TABLET | Freq: Every day | ORAL | Status: DC
  Administered 2018-06-21: 1 mg via ORAL
  Filled 2018-06-20: qty 1

## 2018-06-20 MED ORDER — ALUM & MAG HYDROXIDE-SIMETH 200-200-20 MG/5ML PO SUSP
30.0000 mL | ORAL | Status: DC | PRN
  Administered 2018-06-20: 18:00:00 30 mL via ORAL
  Filled 2018-06-20 (×2): qty 30

## 2018-06-20 NOTE — H&P (Signed)
..   NAME:  Christopher Rogers, MRN:  [id number], DOB:  1960/04/12, LOS: 2 ADMISSION DATE:  06/18/2018, CONSULTATION DATE:  06/18/2018 REFERRING MD:  Arlyss Repress DO, CHIEF COMPLAINT:  Altered Mental Status    Brief History   58 year old homeless male with a past medical history for hypertension, s/p prostatectomy, alcohol abuse, delirium tremens, polysubstance abuse, and depression is from [place] and acute alcohol withdrawal.  PUI for COVID-19. Testing negative . History of present illness   (History obtained from EMR, transfer paperwork and account of other providers)  58 year old homeless male with a past medical history of HTN,s/p prostatectomy, alcohol abuse, delirium tremens, polysubstance abuse and depression presenting from [place] with altered mental status.  Drinking heavily for past 3 weeks. Unable to get out of bed for past 4 days due to leg numbness.  Increased SOB for 48 h with visual hallucinations. Hypertensive and tremulous at [facility]. Continued symptoms despite 13mg  of Ativan. Transferred for initiation of Precedex.  Past Medical History  .Marland Kitchen Active Ambulatory Problems    Diagnosis Date Noted  . Back pain 04/20/2013  . BPH (benign prostatic hyperplasia) 12/07/2014  . Polysubstance abuse ([place]) 09/12/2015  . MDD (major depressive disorder), recurrent severe, without psychosis ([place]) 09/12/2015  . Chest pain 01/02/2017  . ETOH abuse 01/03/2017  . Essential hypertension 01/03/2017  . Hyperbilirubinemia 01/03/2017  . Abnormal LFTs 01/03/2017  . Anxiety and depression 01/03/2017   Resolved Ambulatory Problems    Diagnosis Date Noted  . No Resolved Ambulatory Problems   Past Medical History:  Diagnosis Date  . ADD (attention deficit hyperactivity disorder, inattentive type)   . Anxiety   . Arthritis   . Depression   . Insomnia   . Morbid obesity ([place])     Significant Hospital Events   Acute ETOH withdrawal >>>>>started on Precedex Loaded with phenobarbital  Consults:  None   Procedures:  none  Significant Diagnostic Tests:    Significant Imaging:  CT Abdomen/Pelvis:   1. Acute pancreatitis. Heterogeneous pancreatic enhancement with hypoenhancement in the head and body, most likely due to edema/inflammation, though pancreatic ischemia/early necrosis can have a similar appearance. Follow-up CT abdomen after treatment of the acute pancreatitis is recommended to exclude pancreatic necrosis. No evidence of pseudocyst. 2. Severe diffuse hepatic steatosis and mild hepatomegaly. No hepatic parenchymal masses. 3. High attenuation bile in the gallbladder, likely sludge. No evidence of cholelithiasis or acute cholecystitis. 4. Small amount of ascites dependently in the pelvis and in the paracolic gutters. 5. TURP defect with prior resection of the mass in the prostate gland. No evidence of tumor recurrence. 6. Multilevel degenerative disc disease, spondylosis and facet degenerative changes involving the lumbar spine with a disc extrusion at L5-S1 and superior migration of a desiccated disc fragment behind L5. 7. Incidental finding of extensive retroperitoneal lipomatosis.  CXR FINDING: Hypoventilation with decreased lung volume and bibasilar atelectasis. Negative for heart failure. Negative for pleural effusion. Heart size within normal limits. No acute skeletal abnormality.  Micro Data:  COVID-19 @[place]  negative Repeated on arrival:  MRSA PCR negative  Antimicrobials:  none   Interim history/subjective:  Calm, no longer tremulous. No abdominal pain. Complains of heartburn. Very weak when transferring to chair.  Objective   Blood pressure (!) 152/110, pulse 91, temperature 97.7 F (36.5 C), temperature source Oral, resp. rate (!) 27, height 5\' 10"  (1.778 m), weight 103.6 kg, SpO2 94 %.        Intake/Output Summary (Last 24 hours) at 06/20/2018 1628 Last data filed at  06/20/2018 1540 Gross per 24 hour  Intake 2901.28 ml   Output 1545 ml  Net 1356.28 ml   Filed Weights   06/18/18 2354 06/19/18 0415 06/20/18 0500  Weight: 102.1 kg 101.1 kg 103.6 kg    Examination: General: moderately obese. calm HENT: normocephalic atraumatic w/ nasal cannula, Lungs: chest clear. Cardiovascular: S1 and S2 normal rate and rhythm no rub or gallop Abdomen: soft. Extremities:no edema no deformity SCDs in place Neuro: awake not oriented no focal deficit, no hallucinations. Strength and sensation normal. No tremulousness. GU: condom cath, no urine. erythematous confluent rash over the groin and buttocks    Assessment & Plan:   Was critically ill due to Acute ETOH withdrawal requiring titration of Precedex Now calm off precedex following phenobarbital load. Continue to follow CIWA with symptom triggered lorazepam.  Acute Pancreatitis (moderate in severity) by CT Lipase has normalized. Etiology most likely from ETOH abuse Tolerating progressive diet. Consider imaging if pain recurs.   Alcoholic hepatitis (Severe diffuse hepatic steatosis secondary to ETOH abuse) Transaminases are stable. Pt also has Sludge in gallbladder bout no signs of an obstructing stone. no evidence of cirrhotic changes on imaging  Based on labs from [facility] Disc Fn: 27 Trend LFTs If Disc Fn >32 start glucocorticoids   Disc extrusion L5-S1causing B/l Leg numbness: his initial complaint and why he was unable to get out of the bed for 4-5 days. Currently no severe neurological symptoms Follow exam - weakness may be related to alcohol intoxication PT following - consider neurology evaluation if weakness fails to improve with pT.  Essential Hypertension Restarted home Carvedilol  Best practice:  Diet: Full diet Pain/Anxiety/Delirium protocol (if indicated): CIWA VAP protocol (if indicated): not intubated DVT prophylaxis: Heparin [place] and SCDs GI prophylaxis: Protonix daily Glucose control: h/o prediabetes if BG >180mg /dl will  start ISS Mobility: bedrest on fall precautions Code Status: Full Family Communication: no family  Disposition: Transfer to Med-Surg - order reconciliation complete and Hospitalist contacted.  Labs   CBC: Recent Labs  Lab 06/19/18 0028 06/19/18 0159  WBC  --  9.2  NEUTROABS  --  8.2*  HGB 12.6* 12.8*  HCT 37.0* 38.3*  MCV  --  89.5  PLT  --  48*    Basic Metabolic Panel: Recent Labs  Lab 06/19/18 0028 06/19/18 0159 06/20/18 0301  NA 134* 135 138  K 3.7 4.6 3.4*  CL  --  100 103  CO2  --  18* 21*  GLUCOSE  --  98 137*  BUN  --  19 12  CREATININE  --  1.20 0.98  CALCIUM  --  6.8* 7.7*  MG  --  1.8  --   PHOS  --  3.0  --    GFR: Estimated Creatinine Clearance: 97.8 mL/min (by C-G formula based on SCr of 0.98 mg/dL). Recent Labs  Lab 06/19/18 0159  PROCALCITON 0.99  WBC 9.2  LATICACIDVEN 1.9    Liver Function Tests: Recent Labs  Lab 06/19/18 0159 06/20/18 0301  AST 262* 217*  ALT 113* 113*  ALKPHOS 166* 158*  BILITOT 6.2* 6.7*  PROT 5.2* 5.0*  ALBUMIN 2.6* 2.5*   Recent Labs  Lab 06/20/18 0301  LIPASE 53*   No results for input(s): AMMONIA in the last 168 hours.  ABG    Component Value Date/Time   PHART 7.405 06/19/2018 0028   PCO2ART 32.3 06/19/2018 0028   PO2ART 83.0 06/19/2018 0028   HCO3 20.0 06/19/2018 0028   TCO2 21 (  L) 06/19/2018 0028   ACIDBASEDEF 3.0 (H) 06/19/2018 0028   O2SAT 96.0 06/19/2018 0028     Coagulation Profile: Recent Labs  Lab 06/19/18 0404 06/20/18 0301  INR 1.3* 1.5*    Cardiac Enzymes: No results for input(s): CKTOTAL, CKMB, CKMBINDEX, TROPONINI in the last 168 hours.  HbA1C: Hgb A1c MFr Bld  Date/Time Value Ref Range Status  01/03/2017 03:49 AM 5.5 4.8 - 5.6 % Final    Comment:    (NOTE) Pre diabetes:          5.7%-6.4% Diabetes:              >6.4% Glycemic control for   <7.0% adults with diabetes   09/13/2015 06:42 AM 5.6 4.8 - 5.6 % Final    Comment:    (NOTE)         Pre-diabetes: 5.7 -  6.4         Diabetes: >6.4         Glycemic control for adults with diabetes: <7.0     CBG: Recent Labs  Lab 06/19/18 1941 06/19/18 2358 06/20/18 0750 06/20/18 1136 06/20/18 1522  GLUCAP 166* 119* 168* 175* 167*   35 min spent with 50% on coordination of care.  Kipp Brood, MD [facility] ICU Physician [place]  Pager: [phone] Mobile: [phone] After hours: [phone].

## 2018-06-20 NOTE — Progress Notes (Signed)
[facility] ADULT ICU REPLACEMENT PROTOCOL FOR AM LAB REPLACEMENT ONLY  The patient does apply for the [facility] Adult ICU Electrolyte Replacment Protocol based on the criteria listed below:   1. Is GFR >/= 40 ml/min? Yes.    Patient's GFR today is >60 2. Is urine output >/= 0.5 ml/kg/hr for the last 6 hours? Yes.   Patient's UOP is 0.8 ml/kg/hr 3. Is BUN < 60 mg/dL? Yes.    Patient's BUN today is 12 4. Abnormal electrolyte(s): *K 3.4 5. Ordered repletion with: protocol 6. If a panic level lab has been reported, has the CCM MD in charge been notified? No..   Physician:    Ronda Fairly A 06/20/2018 6:34 AM

## 2018-06-21 ENCOUNTER — Inpatient Hospital Stay (HOSPITAL_COMMUNITY): Payer: BLUE CROSS/BLUE SHIELD

## 2018-06-21 LAB — CBC WITH DIFFERENTIAL/PLATELET
Abs Immature Granulocytes: 0.41 10*3/uL — ABNORMAL HIGH (ref 0.00–0.07)
Basophils Absolute: 0.1 10*3/uL (ref 0.0–0.1)
Basophils Relative: 1 %
Eosinophils Absolute: 0.1 10*3/uL (ref 0.0–0.5)
Eosinophils Relative: 1 %
HCT: 33.9 % — ABNORMAL LOW (ref 39.0–52.0)
Hemoglobin: 11.5 g/dL — ABNORMAL LOW (ref 13.0–17.0)
Immature Granulocytes: 7 %
Lymphocytes Relative: 12 %
Lymphs Abs: 0.7 10*3/uL (ref 0.7–4.0)
MCH: 29.7 pg (ref 26.0–34.0)
MCHC: 33.9 g/dL (ref 30.0–36.0)
MCV: 87.6 fL (ref 80.0–100.0)
Monocytes Absolute: 0.7 10*3/uL (ref 0.1–1.0)
Monocytes Relative: 12 %
Neutro Abs: 4 10*3/uL (ref 1.7–7.7)
Neutrophils Relative %: 67 %
Platelets: 130 10*3/uL — ABNORMAL LOW (ref 150–400)
RBC: 3.87 MIL/uL — ABNORMAL LOW (ref 4.22–5.81)
RDW: 15.5 % (ref 11.5–15.5)
WBC: 5.9 10*3/uL (ref 4.0–10.5)
nRBC: 0.8 % — ABNORMAL HIGH (ref 0.0–0.2)

## 2018-06-21 LAB — COMPREHENSIVE METABOLIC PANEL
ALT: 135 U/L — ABNORMAL HIGH (ref 0–44)
AST: 241 U/L — ABNORMAL HIGH (ref 15–41)
Albumin: 2.7 g/dL — ABNORMAL LOW (ref 3.5–5.0)
Alkaline Phosphatase: 192 U/L — ABNORMAL HIGH (ref 38–126)
Anion gap: 14 (ref 5–15)
BUN: 6 mg/dL (ref 6–20)
CO2: 23 mmol/L (ref 22–32)
Calcium: 8.4 mg/dL — ABNORMAL LOW (ref 8.9–10.3)
Chloride: 100 mmol/L (ref 98–111)
Creatinine, Ser: 0.77 mg/dL (ref 0.61–1.24)
GFR calc Af Amer: >60 mL/min (ref >60)
GFR calc non Af Amer: >60 mL/min (ref >60)
Glucose, Bld: 130 mg/dL — ABNORMAL HIGH (ref 70–99)
Potassium: 3.6 mmol/L (ref 3.5–5.1)
Sodium: 137 mmol/L (ref 135–145)
Total Bilirubin: 9.5 mg/dL — ABNORMAL HIGH (ref 0.3–1.2)
Total Protein: 5.5 g/dL — ABNORMAL LOW (ref 6.5–8.1)

## 2018-06-21 LAB — PROTIME-INR
INR: 1.4 — ABNORMAL HIGH (ref 0.8–1.2)
Prothrombin Time: 17.1 seconds — ABNORMAL HIGH (ref 11.4–15.2)

## 2018-06-21 LAB — VITAMIN B12: Vitamin B-12: 3135 pg/mL — ABNORMAL HIGH (ref 180–914)

## 2018-06-21 LAB — LIPASE, BLOOD: Lipase: 31 U/L (ref 11–51)

## 2018-06-21 LAB — GAMMA GT: GGT: 775 U/L — ABNORMAL HIGH (ref 7–50)

## 2018-06-21 LAB — ACETAMINOPHEN LEVEL: Acetaminophen (Tylenol), Serum: <10 ug/mL — ABNORMAL LOW (ref 10–30)

## 2018-06-21 MED ORDER — ADULT MULTIVITAMIN W/MINERALS CH
1.0000 | ORAL_TABLET | Freq: Every day | ORAL | Status: DC
  Administered 2018-06-22 – 2018-06-25 (×4): 1 via ORAL
  Filled 2018-06-21 (×4): qty 1

## 2018-06-21 MED ORDER — PROMETHAZINE HCL 25 MG/ML IJ SOLN
12.5000 mg | Freq: Four times a day (QID) | INTRAMUSCULAR | Status: DC | PRN
  Administered 2018-06-21: 12.5 mg via INTRAVENOUS
  Filled 2018-06-21: qty 1

## 2018-06-21 MED ORDER — THIAMINE HCL 100 MG/ML IJ SOLN: 100.0000 mg | Freq: Every day | INTRAMUSCULAR | Status: DC

## 2018-06-21 MED ORDER — MORPHINE SULFATE (PF) 2 MG/ML IV SOLN
2.0000 mg | INTRAVENOUS | Status: DC | PRN
  Administered 2018-06-21 (×3): 2 mg via INTRAVENOUS
  Filled 2018-06-21 (×3): qty 1

## 2018-06-21 MED ORDER — HYDRALAZINE HCL 20 MG/ML IJ SOLN
10.0000 mg | Freq: Once | INTRAMUSCULAR | Status: AC
  Administered 2018-06-21: 10 mg via INTRAVENOUS
  Filled 2018-06-21: qty 1

## 2018-06-21 MED ORDER — VITAMIN B-1 100 MG PO TABS
100.0000 mg | ORAL_TABLET | Freq: Every day | ORAL | Status: DC
  Administered 2018-06-22 – 2018-06-25 (×4): 100 mg via ORAL
  Filled 2018-06-21 (×4): qty 1

## 2018-06-21 MED ORDER — FOLIC ACID 1 MG PO TABS
1.0000 mg | ORAL_TABLET | Freq: Every day | ORAL | Status: DC
  Administered 2018-06-22 – 2018-06-25 (×4): 1 mg via ORAL
  Filled 2018-06-21 (×4): qty 1

## 2018-06-21 MED ORDER — LORAZEPAM 2 MG/ML IJ SOLN: 0.0000 mg | Freq: Two times a day (BID) | INTRAMUSCULAR | Status: AC

## 2018-06-21 MED ORDER — LORAZEPAM 2 MG/ML IJ SOLN: 1.0000 mg | Freq: Four times a day (QID) | INTRAMUSCULAR | Status: AC | PRN

## 2018-06-21 MED ORDER — METHOCARBAMOL 500 MG PO TABS
500.0000 mg | ORAL_TABLET | Freq: Three times a day (TID) | ORAL | Status: DC | PRN
  Administered 2018-06-22 – 2018-06-25 (×3): 500 mg via ORAL
  Filled 2018-06-21 (×3): qty 1

## 2018-06-21 MED ORDER — ONDANSETRON HCL 4 MG/2ML IJ SOLN: 4.0000 mg | Freq: Four times a day (QID) | INTRAMUSCULAR | Status: DC | PRN

## 2018-06-21 MED ORDER — LORAZEPAM 1 MG PO TABS
1.0000 mg | ORAL_TABLET | Freq: Four times a day (QID) | ORAL | Status: AC | PRN
  Administered 2018-06-23: 1 mg via ORAL
  Filled 2018-06-21: qty 1

## 2018-06-21 MED ORDER — LORAZEPAM 2 MG/ML IJ SOLN: 0.0000 mg | Freq: Four times a day (QID) | INTRAMUSCULAR | Status: AC

## 2018-06-21 NOTE — Significant Event (Addendum)
Rapid Response Event Note  Overview:  Transfer Rounding    Initial Focused Assessment: Patient laying in bed, in no distress but complaining of upper back pain 10/10. States that this is chronic pain he usually takes NSAIDs for.  RN at bedside giving morning scheduled meds.  RN aware of patient's requests, MD paged - will address on rounds. Patient also has no diet order and requesting breakfast.   Interventions: -- MD paged [phone] by RRT -- Hot pack placed on upper back   Plan of Care (if not transferred): -- MD returned page [phone] - Morphine ordered, patient NPO until MD sees on rounds.  -- RN aware of new orders -- Start CIWA scores -- Call RRT as needed  Event Summary: End time: 0848    Christopher Rogers

## 2018-06-21 NOTE — Progress Notes (Signed)
[facility] Progress Note  Patient: Christopher Rogers HGD:[id number]   PCP: System, Pcp Not In DOB: 11/07/1960   DOA: 06/18/2018   DOS: 06/21/2018   Date of Service: the patient was seen and examined on 06/21/2018  Brief hospital course: Pt. with PMH of alcohol abuse, HTN, delirium tremens, depression; admitted on 06/18/2018, presented with complaint of confusion and withdrawals, was found to have delirium tremens.  Initially arrived at [facility].  Patient was transferred to [facility] for ICU care Currently further plan is continue CIWA protocol.  Subjective: Reports he is hungry, no nausea no vomiting no abdominal pain.  No fever no chills.  No further hallucination for now.  Assessment and Plan: 1.  Alcohol withdrawal. Delirium tremors. Acute on encephalopathy from alcohol withdrawal Initially was on Precedex as well as phenobarbital lower. We will continue on MedSurg CIWA protocol. Monitor on telemetry.  2.  Alcoholic hepatitis. Severe hyperbilirubinemia. Maddrey's Discriminant Function 27.9 May require glucocorticoids if worsens. Ultrasound abdomen shows hepatic steatosis, liver Doppler negative for Budd-Chiari syndrome.  3.  Acute pancreatitis. CT scan of the abdomen on the time of the admission no suggesting of acute pancreatitis. Lipase was in 2000. Lab is currently normal. Patient reports no abdominal pain. I will gently start the patient on clear liquid diet. May require full liquid advancement tomorrow morning.  4.  Disc extrusion of L5-S1 with bilateral leg numbness. Currently no worsening. But may require MRI of the condition worsens. Will definitely require outpatient neurosurgical follow-up.  5.  Concern for COVID.  Ruled out x2. Initially presented with encephalopathy there was not concern for COVID patient had negative test at [facility] another negative test here as well. Monitor.  6. Body mass index is 32.87 kg/m.  Morbid obesity.  Monitor.  7. Thrombocytopenia. Improving. Monitor.   Diet: Clear liquid diet DVT Prophylaxis: subcutaneous Heparin  Advance goals of care discussion: Full code  Family Communication: no family was present at bedside, at the time of interview.   Disposition:  Discharge to home.  Consultants: none, PCCM primary admission, may require GI Procedures: none  Scheduled Meds: . carvedilol  12.5 mg Oral BID WC  . escitalopram  20 mg Oral QHS  . folic acid  1 mg Oral Daily  . LORazepam  0-4 mg Intravenous Q6H   Followed by  . [START ON 06/23/2018] LORazepam  0-4 mg Intravenous Q12H  . multivitamin with minerals  1 tablet Oral Daily  . pantoprazole  40 mg Oral Daily  . thiamine  100 mg Oral Daily   Or  . thiamine  100 mg Intravenous Daily   Continuous Infusions: PRN Meds: alum & mag hydroxide-simeth, LORazepam **OR** LORazepam, methocarbamol, morphine injection, ondansetron (ZOFRAN) IV, promethazine Antibiotics: Anti-infectives (From admission, onward)   Start     Dose/Rate Route Frequency Ordered Stop   06/19/18 0415  vancomycin (VANCOCIN) 1,250 mg in sodium chloride 0.9 % 250 mL IVPB  Status:  Discontinued     1,250 mg 166.7 mL/hr over 90 Minutes Intravenous Every 12 hours 06/19/18 0404 06/19/18 0938   06/19/18 0415  ceFEPIme (MAXIPIME) 2 g in sodium chloride 0.9 % 100 mL IVPB  Status:  Discontinued     2 g 200 mL/hr over 30 Minutes Intravenous Every 8 hours 06/19/18 0404 06/19/18 0938       Objective: Physical Exam: Vitals:   06/20/18 2020 06/21/18 0431 06/21/18 0923 06/21/18 1633  BP: (!) 166/105 (!) 170/100 (!) 154/104 (!) 153/102  Pulse: 91 87 92  89  Resp: (!) 23 (!) 22 18 18   Temp: 99.8 F (37.7 C) 98.7 F (37.1 C) 98.8 F (37.1 C) 99.3 F (37.4 C)  TempSrc: Oral Oral Oral Oral  SpO2: 98% 99% 96% 97%  Weight: 103.9 kg     Height:        Intake/Output Summary (Last 24 hours) at 06/21/2018 2033 Last data filed at 06/21/2018 1746 Gross per 24 hour  Intake  1140 ml  Output 550 ml  Net 590 ml   Filed Weights   06/19/18 0415 06/20/18 0500 06/20/18 2020  Weight: 101.1 kg 103.6 kg 103.9 kg   General: Alert, Awake and Oriented to Time, Place and Person. Appear in mild distress, affect appropriate Eyes: PERRL, Conjunctiva normal ENT: Oral Mucosa clear moist. Neck: no JVD, no Abnormal Mass Or lumps Cardiovascular: S1 and S2 Present, no Murmur, Peripheral Pulses Present Respiratory: normal respiratory effort, Bilateral Air entry equal and Decreased, no use of accessory muscle, Clear to Auscultation, no Crackles, no wheezes Abdomen: Bowel Sound present, Soft and no tenderness, no hernia Skin: no redness, no Rash, no induration Extremities: no Pedal edema, no calf tenderness Neurologic: Grossly no focal neuro deficit. Bilaterally Equal motor strength  Data Reviewed: CBC: Recent Labs  Lab 06/19/18 0028 06/19/18 0159 06/21/18 0916  WBC  --  9.2 5.9  NEUTROABS  --  8.2* 4.0  HGB 12.6* 12.8* 11.5*  HCT 37.0* 38.3* 33.9*  MCV  --  89.5 87.6  PLT  --  48* 778*   Basic Metabolic Panel: Recent Labs  Lab 06/19/18 0028 06/19/18 0159 06/20/18 0301 06/21/18 0354  NA 134* 135 138 137  K 3.7 4.6 3.4* 3.6  CL  --  100 103 100  CO2  --  18* 21* 23  GLUCOSE  --  98 137* 130*  BUN  --  19 12 6   CREATININE  --  1.20 0.98 0.77  CALCIUM  --  6.8* 7.7* 8.4*  MG  --  1.8  --   --   PHOS  --  3.0  --   --     Liver Function Tests: Recent Labs  Lab 06/19/18 0159 06/20/18 0301 06/21/18 0354  AST 262* 217* 241*  ALT 113* 113* 135*  ALKPHOS 166* 158* 192*  BILITOT 6.2* 6.7* 9.5*  PROT 5.2* 5.0* 5.5*  ALBUMIN 2.6* 2.5* 2.7*   Recent Labs  Lab 06/20/18 0301 06/21/18 0354  LIPASE 53* 31   No results for input(s): AMMONIA in the last 168 hours. Coagulation Profile: Recent Labs  Lab 06/19/18 0404 06/20/18 0301 06/21/18 0354  INR 1.3* 1.5* 1.4*   Cardiac Enzymes: No results for input(s): CKTOTAL, CKMB, CKMBINDEX, TROPONINI in the  last 168 hours. BNP (last 3 results) No results for input(s): PROBNP in the last 8760 hours. CBG: Recent Labs  Lab 06/19/18 1941 06/19/18 2358 06/20/18 0750 06/20/18 1136 06/20/18 1522  GLUCAP 166* 119* 168* 175* 167*   Studies: US Liver Doppler  Result Date: 06/21/2018 CLINICAL DATA:  58 year old male with a history of elevated LFTs EXAM: DUPLEX ULTRASOUND OF LIVER TECHNIQUE: Color and duplex Doppler ultrasound was performed to evaluate the hepatic in-flow and out-flow vessels. COMPARISON:  None. FINDINGS: Portal Vein Velocities Main:  23 cm/sec Right:  18 cm/sec Left:  15 cm/sec Hepatic Vein Velocities Right:  19 cm/sec Middle:  55 cm/sec Left:  43 cm/sec Hepatic Artery Velocity:  29 cm/sec Splenic Vein Velocity:  27 cm/sec Varices: Absent Ascites: Absent Spleen volume measures 291 cc  IMPRESSION: Unremarkable directed duplex of the hepatic vasculature. Electronically Signed   By: Corrie Mckusick D.O.   On: 06/21/2018 15:57   US Abdomen Limited Ruq  Result Date: 06/21/2018 CLINICAL DATA:  Elevated liver function tests. EXAM: ULTRASOUND ABDOMEN LIMITED RIGHT UPPER QUADRANT COMPARISON:  None. FINDINGS: Gallbladder: No gallstones or wall thickening visualized. No sonographic Murphy sign noted by sonographer. Common bile duct: Diameter: 4 mm, normal Liver: No focal lesion identified. Increased echogenicity of the liver parenchyma. Portal vein is patent on color Doppler imaging with normal direction of blood flow towards the liver. IMPRESSION: Increased echogenicity of the liver parenchyma suggesting hepatic steatosis. Otherwise, normal exam. Electronically Signed   By: Lorriane Shire M.D.   On: 06/21/2018 15:28     Time spent: 35 minutes  Author: Berle Mull, MD [facility] 06/21/2018 8:33 PM  To reach On-call, see care teams to locate the attending and reach out to them via [email or web address]. If 7PM-7AM, please contact night-coverage If you still have difficulty reaching the attending  provider, please page the [facility] (Director on Call) for [facility] on amion for assistance.

## 2018-06-22 DIAGNOSIS — I10 Essential (primary) hypertension: Secondary | ICD-10-CM

## 2018-06-22 DIAGNOSIS — R748 Abnormal levels of other serum enzymes: Secondary | ICD-10-CM | POA: Diagnosis present

## 2018-06-22 LAB — COMPREHENSIVE METABOLIC PANEL
ALT: 153 U/L — ABNORMAL HIGH (ref 0–44)
AST: 241 U/L — ABNORMAL HIGH (ref 15–41)
Albumin: 2.9 g/dL — ABNORMAL LOW (ref 3.5–5.0)
Alkaline Phosphatase: 282 U/L — ABNORMAL HIGH (ref 38–126)
Anion gap: 18 — ABNORMAL HIGH (ref 5–15)
BUN: 10 mg/dL (ref 6–20)
CO2: 22 mmol/L (ref 22–32)
Calcium: 8.4 mg/dL — ABNORMAL LOW (ref 8.9–10.3)
Chloride: 93 mmol/L — ABNORMAL LOW (ref 98–111)
Creatinine, Ser: 0.85 mg/dL (ref 0.61–1.24)
GFR calc Af Amer: >60 mL/min (ref >60)
GFR calc non Af Amer: >60 mL/min (ref >60)
Glucose, Bld: 108 mg/dL — ABNORMAL HIGH (ref 70–99)
Potassium: 3.6 mmol/L (ref 3.5–5.1)
Sodium: 133 mmol/L — ABNORMAL LOW (ref 135–145)
Total Bilirubin: 11.4 mg/dL — ABNORMAL HIGH (ref 0.3–1.2)
Total Protein: 6.1 g/dL — ABNORMAL LOW (ref 6.5–8.1)

## 2018-06-22 LAB — BILIRUBIN, FRACTIONATED(TOT/DIR/INDIR)
Bilirubin, Direct: 6.6 mg/dL — ABNORMAL HIGH (ref 0.0–0.2)
Indirect Bilirubin: 4 mg/dL — ABNORMAL HIGH (ref 0.3–0.9)
Total Bilirubin: 10.6 mg/dL — ABNORMAL HIGH (ref 0.3–1.2)

## 2018-06-22 LAB — HEPATITIS PANEL, ACUTE
HCV Ab: <0.1 s/co ratio (ref 0.0–0.9)
Hep A IgM: NEGATIVE
Hep B C IgM: NEGATIVE
Hepatitis B Surface Ag: NEGATIVE

## 2018-06-22 LAB — CK: Total CK: 111 U/L (ref 49–397)

## 2018-06-22 LAB — LIPASE, BLOOD: Lipase: 32 U/L (ref 11–51)

## 2018-06-22 LAB — LACTATE DEHYDROGENASE: LDH: 993 U/L — ABNORMAL HIGH (ref 98–192)

## 2018-06-22 LAB — PROTIME-INR
INR: 1.4 — ABNORMAL HIGH (ref 0.8–1.2)
Prothrombin Time: 16.5 seconds — ABNORMAL HIGH (ref 11.4–15.2)

## 2018-06-22 MED ORDER — SENNOSIDES-DOCUSATE SODIUM 8.6-50 MG PO TABS
1.0000 | ORAL_TABLET | Freq: Every day | ORAL | Status: DC
  Administered 2018-06-22 – 2018-06-24 (×3): 1 via ORAL
  Filled 2018-06-22 (×3): qty 1

## 2018-06-22 MED ORDER — OXYCODONE HCL 5 MG PO TABS
5.0000 mg | ORAL_TABLET | Freq: Four times a day (QID) | ORAL | Status: DC | PRN
  Administered 2018-06-22 – 2018-06-25 (×2): 5 mg via ORAL
  Filled 2018-06-22 (×2): qty 1

## 2018-06-22 NOTE — Progress Notes (Signed)
PROGRESS NOTE  Christopher Rogers:[id number] DOB: 09/09/1960 DOA: 06/18/2018 PCP: System, Pcp Not In   LOS: 4 days   Patient is from: home  Brief Narrative / Interim history: 58 year old male with history of alcohol use disorder, HTN, delirium tremens, depression; admitted on 06/18/2018, confusion and alcohol withdrawal and was found to have delirium tremens.  Initially arrived at [facility].  Patient was transferred to [facility] for ICU care.  Status post Precedex drip and phenobarbital. Currently on CIWA protocol with Ativan.   Subjective: No major events overnight of this morning.  Complains about abdominal pain and back pain.  Denies nausea or vomiting.  Denies audiovisual hallucination, shaking or other signs or symptoms of withdrawal.  Assessment & Plan: Active Problems:   BPH (benign prostatic hyperplasia)   MDD (major depressive disorder), recurrent severe, without psychosis ([place])   Essential hypertension   Alcohol withdrawal ([place])   Steatosis of liver   Encounter for nasogastric (NG) tube placement   Protrusion of lumbar intervertebral disc  Alcohol withdrawal/delirium tremens/acute encephalopathy from alcohol withdrawal -Status post Precedex and phenobarbital -Recent CIWA ranging 0-2. -Continue MedSurg CIWA protocol -Discontinue telemetry -Continue vitamins  Alcoholic hepatitis/elevated liver enzymes/hyperbilirubinemia/elevated alkaline phosphatase -Abdominal ultrasound shows hepatic steatosis. -Liver Doppler negative for Budd-Chiari syndrome -CK within normal range -Check fractionated bili, haptoglobin and LDH -Maddrey's Discriminant Function 27.9 -May require glucocorticoids if worsens. -Will consult GI if no improvement   Acute alcoholic pancreatitis-CT abdomen negative -Lipase elevated to 2000 on arrival and normalized -We will advance diet to full liquid diet  Mild hyponatremia: beer potomania -Continue monitoring  Disc extrusion of  L5/S1 with bilateral leg numbness-stable -Pain management with Robaxin and PRN oxycodone -Outpatient neurosurgery follow-up -MRI is concerning neuro finding.  Obesity: BMI 32.87 -Outpatient follow-up for lifestyle change to lose weight  Thrombocytopenia: Stable.  Likely due to alcohol. Improved -Continue monitoring  Normocytic anemia: Likely due to chronic disease and alcohol -Continue monitoring  Major depression: Stable. -Continue home Lexapro  Scheduled Meds: . carvedilol  12.5 mg Oral BID WC  . escitalopram  20 mg Oral QHS  . folic acid  1 mg Oral Daily  . LORazepam  0-4 mg Intravenous Q6H   Followed by  . [START ON 06/23/2018] LORazepam  0-4 mg Intravenous Q12H  . multivitamin with minerals  1 tablet Oral Daily  . pantoprazole  40 mg Oral Daily  . senna-docusate  1 tablet Oral QHS  . thiamine  100 mg Oral Daily   Or  . thiamine  100 mg Intravenous Daily   Continuous Infusions: PRN Meds:.alum & mag hydroxide-simeth, LORazepam **OR** LORazepam, methocarbamol, morphine injection, ondansetron (ZOFRAN) IV, oxyCODONE, promethazine   DVT prophylaxis: SCD Code Status: Full code Family Communication: None at bedside Disposition Plan: Remains inpatient due to elevated liver enzymes and hyperbilirubinemia  Consultants:   None  Procedures:   None  Microbiology: . None  Antimicrobials:  none   Objective: Vitals:   06/21/18 0923 06/21/18 1633 06/21/18 2052 06/22/18 0444  BP: (!) 154/104 (!) 153/102 (!) 149/106 (!) 152/100  Pulse: 92 89 78 77  Resp: 18 18 (!) 21 (!) 21  Temp: 98.8 F (37.1 C) 99.3 F (37.4 C) 98.9 F (37.2 C) 98.6 F (37 C)  TempSrc: Oral Oral Oral Oral  SpO2: 96% 97% 97% 97%  Weight:   99.9 kg   Height:        Intake/Output Summary (Last 24 hours) at 06/22/2018 1104 Last data filed at 06/22/2018 0900 Gross per  24 hour  Intake 1260 ml  Output 1150 ml  Net 110 ml   Filed Weights   06/20/18 0500 06/20/18 2020 06/21/18 2052  Weight:  103.6 kg 103.9 kg 99.9 kg    Examination:  GENERAL: No acute distress.  Appears well.  HEENT: MMM.  Vision and hearing grossly intact.  NECK: Supple.  No JVD.  LUNGS:  No IWOB. Good air movement bilaterally. HEART:  RRR. Heart sounds normal.  ABD: Bowel sounds present. Non tender. Slightly distended MSK/EXT:  Moves all extremities. No apparent deformity. No edema bilaterally.  SKIN: no apparent skin lesion or wound NEURO: Awake, alert and oriented appropriately.  No gross deficit.  PSYCH: Calm. Normal affect.    Data Reviewed: I have independently reviewed following labs and imaging studies   CBC: Recent Labs  Lab 06/19/18 0028 06/19/18 0159 06/21/18 0916  WBC  --  9.2 5.9  NEUTROABS  --  8.2* 4.0  HGB 12.6* 12.8* 11.5*  HCT 37.0* 38.3* 33.9*  MCV  --  89.5 87.6  PLT  --  48* 355*   Basic Metabolic Panel: Recent Labs  Lab 06/19/18 0028 06/19/18 0159 06/20/18 0301 06/21/18 0354 06/22/18 0422  NA 134* 135 138 137 133*  K 3.7 4.6 3.4* 3.6 3.6  CL  --  100 103 100 93*  CO2  --  18* 21* 23 22  GLUCOSE  --  98 137* 130* 108*  BUN  --  19 12 6 10   CREATININE  --  1.20 0.98 0.77 0.85  CALCIUM  --  6.8* 7.7* 8.4* 8.4*  MG  --  1.8  --   --   --   PHOS  --  3.0  --   --   --    GFR: Estimated Creatinine Clearance: 110.9 mL/min (by C-G formula based on SCr of 0.85 mg/dL). Liver Function Tests: Recent Labs  Lab 06/19/18 0159 06/20/18 0301 06/21/18 0354 06/22/18 0422  AST 262* 217* 241* 241*  ALT 113* 113* 135* 153*  ALKPHOS 166* 158* 192* 282*  BILITOT 6.2* 6.7* 9.5* 11.4*  PROT 5.2* 5.0* 5.5* 6.1*  ALBUMIN 2.6* 2.5* 2.7* 2.9*   Recent Labs  Lab 06/20/18 0301 06/21/18 0354 06/22/18 0422  LIPASE 53* 31 32   No results for input(s): AMMONIA in the last 168 hours. Coagulation Profile: Recent Labs  Lab 06/19/18 0404 06/20/18 0301 06/21/18 0354 06/22/18 0422  INR 1.3* 1.5* 1.4* 1.4*   Cardiac Enzymes: Recent Labs  Lab 06/22/18 0442  CKTOTAL 111    BNP (last 3 results) No results for input(s): PROBNP in the last 8760 hours. HbA1C: No results for input(s): HGBA1C in the last 72 hours. CBG: Recent Labs  Lab 06/19/18 1941 06/19/18 2358 06/20/18 0750 06/20/18 1136 06/20/18 1522  GLUCAP 166* 119* 168* 175* 167*   Lipid Profile: No results for input(s): CHOL, HDL, LDLCALC, TRIG, CHOLHDL, LDLDIRECT in the last 72 hours. Thyroid Function Tests: No results for input(s): TSH, T4TOTAL, FREET4, T3FREE, THYROIDAB in the last 72 hours. Anemia Panel: Recent Labs    06/21/18 0354  VITAMINB12 3,135*   Urine analysis:    Component Value Date/Time   COLORURINE AMBER (A) 06/19/2018 0255   APPEARANCEUR HAZY (A) 06/19/2018 0255   LABSPEC >1.046 (H) 06/19/2018 0255   PHURINE 6.0 06/19/2018 0255   GLUCOSEU NEGATIVE 06/19/2018 0255   HGBUR MODERATE (A) 06/19/2018 0255   BILIRUBINUR SMALL (A) 06/19/2018 0255   KETONESUR 20 (A) 06/19/2018 0255   PROTEINUR 100 (A) 06/19/2018  0255   NITRITE NEGATIVE 06/19/2018 0255   LEUKOCYTESUR TRACE (A) 06/19/2018 0255   Sepsis Labs: Invalid input(s): PROCALCITONIN, LACTICIDVEN  Recent Results (from the past 240 hour(s))  MRSA PCR Screening     Status: None   Collection Time: 06/18/18 11:58 PM  Result Value Ref Range Status   MRSA by PCR NEGATIVE NEGATIVE Final    Comment:        The GeneXpert MRSA Assay (FDA approved for NASAL specimens only), is one component of a comprehensive MRSA colonization surveillance program. It is not intended to diagnose MRSA infection nor to guide or monitor treatment for MRSA infections. Performed at [facility], [place] [address] [place], [place] [address]   SARS Coronavirus 2 [facility] order, Performed in [facility] hospital lab)     Status: None   Collection Time: 06/19/18 12:47 AM  Result Value Ref Range Status   SARS Coronavirus 2 NEGATIVE NEGATIVE Final    Comment: (NOTE) If result is NEGATIVE SARS-CoV-2 target nucleic acids are NOT DETECTED.  The SARS-CoV-2 RNA is generally detectable in upper and lower  respiratory specimens during the acute phase of infection. The lowest  concentration of SARS-CoV-2 viral copies this assay can detect is 250  copies / mL. A negative result does not preclude SARS-CoV-2 infection  and should not be used as the sole basis for treatment or other  patient management decisions.  A negative result may occur with  improper specimen collection / handling, submission of specimen other  than nasopharyngeal swab, presence of viral mutation(s) within the  areas targeted by this assay, and inadequate number of viral copies  (<250 copies / mL). A negative result must be combined with clinical  observations, patient history, and epidemiological information. If result is POSITIVE SARS-CoV-2 target nucleic acids are DETECTED. The SARS-CoV-2 RNA is generally detectable in upper and lower  respiratory specimens dur ing the acute phase of infection.  Positive  results are indicative of active infection with SARS-CoV-2.  Clinical  correlation with patient history and other diagnostic information is  necessary to determine patient infection status.  Positive results do  not rule out bacterial infection or co-infection with other viruses. If result is PRESUMPTIVE POSTIVE SARS-CoV-2 nucleic acids MAY BE PRESENT.   A presumptive positive result was obtained on the submitted specimen  and confirmed on repeat testing.  While 2019 novel coronavirus  (SARS-CoV-2) nucleic acids may be present in the submitted sample  additional confirmatory testing may be necessary for epidemiological  and / or clinical management purposes  to differentiate between  SARS-CoV-2 and other Sarbecovirus currently known to infect humans.  If clinically indicated additional testing with an alternate test  methodology [phone]) is advised. The SARS-CoV-2 RNA is generally  detectable in upper and lower respiratory sp ecimens during the acute   phase of infection. The expected result is Negative. Fact Sheet for Patients:  StrictlyIdeas.no Fact Sheet for Healthcare Providers: BankingDealers.co.za This test is not yet approved or cleared by the [place] FDA and has been authorized for detection and/or diagnosis of SARS-CoV-2 by FDA under an Emergency Use Authorization (EUA).  This EUA will remain in effect (meaning this test can be used) for the duration of the COVID-19 declaration under Section 564(b)(1) of the Act, 21 U.S.C. section 360bbb-3(b)(1), unless the authorization is terminated or revoked sooner. Performed at [facility], [place] [address] [place], [place] [address]   Culture, blood (routine x 2)     Status: None (  Preliminary result)   Collection Time: 06/19/18  7:10 AM  Result Value Ref Range Status   Specimen Description BLOOD LEFT HAND  Final   Special Requests AEROBIC BOTTLE ONLY Blood Culture adequate volume  Final   Culture   Final    NO GROWTH 2 DAYS Performed at [facility], [address], [place], [place] [address]    Report Status PENDING  Incomplete  Culture, blood (routine x 2)     Status: None (Preliminary result)   Collection Time: 06/19/18  7:20 AM  Result Value Ref Range Status   Specimen Description BLOOD RIGHT HAND  Final   Special Requests AEROBIC BOTTLE ONLY Blood Culture adequate volume  Final   Culture   Final    NO GROWTH 2 DAYS Performed at [facility], [place] [address] [place], [place] [address]    Report Status PENDING  Incomplete      Radiology Studies: US Liver Doppler  Result Date: 06/21/2018 CLINICAL DATA:  58 year old male with a history of elevated LFTs EXAM: DUPLEX ULTRASOUND OF LIVER TECHNIQUE: Color and duplex Doppler ultrasound was performed to evaluate the hepatic in-flow and out-flow vessels. COMPARISON:  None. FINDINGS: Portal Vein Velocities Main:  23 cm/sec Right:  18 cm/sec Left:  15 cm/sec  Hepatic Vein Velocities Right:  19 cm/sec Middle:  55 cm/sec Left:  43 cm/sec Hepatic Artery Velocity:  29 cm/sec Splenic Vein Velocity:  27 cm/sec Varices: Absent Ascites: Absent Spleen volume measures 291 cc IMPRESSION: Unremarkable directed duplex of the hepatic vasculature. Electronically Signed   By: Corrie Mckusick D.O.   On: 06/21/2018 15:57   US Abdomen Limited Ruq  Result Date: 06/21/2018 CLINICAL DATA:  Elevated liver function tests. EXAM: ULTRASOUND ABDOMEN LIMITED RIGHT UPPER QUADRANT COMPARISON:  None. FINDINGS: Gallbladder: No gallstones or wall thickening visualized. No sonographic Murphy sign noted by sonographer. Common bile duct: Diameter: 4 mm, normal Liver: No focal lesion identified. Increased echogenicity of the liver parenchyma. Portal vein is patent on color Doppler imaging with normal direction of blood flow towards the liver. IMPRESSION: Increased echogenicity of the liver parenchyma suggesting hepatic steatosis. Otherwise, normal exam. Electronically Signed   By: Lorriane Shire M.D.   On: 06/21/2018 15:28    Leno Mathes [facility] Pager [phone]  If 7PM-7AM, please contact night-coverage [email or web address] Password [email or web address] 06/22/2018, 11:04 AM

## 2018-06-22 NOTE — TOC Initial Note (Signed)
Transition of Care [facility]) - Initial/Assessment Note    Patient Details  Name: Christopher Rogers MRN: [id number] Date of Birth: 18-Mar-1960  Transition of [facility]) CM/SW Contact:    Bartholomew Crews, RN Phone Number: 06/22/2018, 3:49 PM  Clinical Narrative:                 Spoke with patient at bedside. PTA had been staying in a hotel, however, patient states that he will be returning to his mom's home.   Stated that someone will be able to pick him up or he may call an uber.   Patient concerned about his ability to ambulate - requested PT eval from MD.   Patient states that he gets his medications from [facility].   PCP - Dr. Garlon Hatchet - who he visits about 3 times/year.   Discussed SA resources, but patient states, "I got all that at home." Advised that his insurance company can also provide information.   CM to follow for transition of care needs.   Expected Discharge Plan: Home/Self Care Barriers to Discharge: Continued Medical Work up   Patient Goals and CMS Choice        Expected Discharge Plan and Services Expected Discharge Plan: Home/Self Care In-house Referral: Clinical Social Work Discharge Planning Services: CM Consult                                          Prior Living Arrangements/Services   Lives with:: Self, Parents                   Activities of Daily Living      Permission Sought/Granted                  Emotional Assessment       Orientation: : Oriented to Self, Oriented to  Time, Oriented to Place, Oriented to Situation Alcohol / Substance Use: Alcohol Use    Admission diagnosis:  DELERIUM TREMENS PANCREATITIS ALCOHOL WITHDRAWAL  COVID RULE OUT Patient Active Problem List   Diagnosis Date Noted  . Elevated liver enzymes 06/22/2018  . Alkaline phosphatase elevation 06/22/2018  . Steatosis of liver   . Encounter for nasogastric (NG) tube placement   . Protrusion of lumbar intervertebral disc   . Alcohol  withdrawal ([place]) 06/18/2018  . ETOH abuse 01/03/2017  . Essential hypertension 01/03/2017  . Hyperbilirubinemia 01/03/2017  . Abnormal LFTs 01/03/2017  . Anxiety and depression 01/03/2017  . Chest pain 01/02/2017  . Polysubstance abuse ([place]) 09/12/2015  . MDD (major depressive disorder), recurrent severe, without psychosis ([place]) 09/12/2015  . BPH (benign prostatic hyperplasia) 12/07/2014  . Back pain 04/20/2013   PCP:  System, Pcp Not In Pharmacy:   [facility], [place] [address] Phone: [phone] Fax: [phone]     Social Determinants of Health (SDOH) Interventions    Readmission Risk Interventions No flowsheet data found.

## 2018-06-23 DIAGNOSIS — E871 Hypo-osmolality and hyponatremia: Secondary | ICD-10-CM

## 2018-06-23 DIAGNOSIS — K7010 Alcoholic hepatitis without ascites: Secondary | ICD-10-CM

## 2018-06-23 DIAGNOSIS — E876 Hypokalemia: Secondary | ICD-10-CM

## 2018-06-23 LAB — CREATININE, URINE, RANDOM: Creatinine, Urine: 128.2 mg/dL

## 2018-06-23 LAB — HAPTOGLOBIN: Haptoglobin: 224 mg/dL (ref 29–370)

## 2018-06-23 LAB — COMPREHENSIVE METABOLIC PANEL
ALT: 145 U/L — ABNORMAL HIGH (ref 0–44)
AST: 205 U/L — ABNORMAL HIGH (ref 15–41)
Albumin: 2.5 g/dL — ABNORMAL LOW (ref 3.5–5.0)
Alkaline Phosphatase: 273 U/L — ABNORMAL HIGH (ref 38–126)
Anion gap: 12 (ref 5–15)
BUN: 12 mg/dL (ref 6–20)
CO2: 27 mmol/L (ref 22–32)
Calcium: 8.1 mg/dL — ABNORMAL LOW (ref 8.9–10.3)
Chloride: 94 mmol/L — ABNORMAL LOW (ref 98–111)
Creatinine, Ser: 0.72 mg/dL (ref 0.61–1.24)
GFR calc Af Amer: >60 mL/min (ref >60)
GFR calc non Af Amer: >60 mL/min (ref >60)
Glucose, Bld: 122 mg/dL — ABNORMAL HIGH (ref 70–99)
Potassium: 2.9 mmol/L — ABNORMAL LOW (ref 3.5–5.1)
Sodium: 133 mmol/L — ABNORMAL LOW (ref 135–145)
Total Bilirubin: 10.6 mg/dL — ABNORMAL HIGH (ref 0.3–1.2)
Total Protein: 5.1 g/dL — ABNORMAL LOW (ref 6.5–8.1)

## 2018-06-23 LAB — CBC
HCT: 34.9 % — ABNORMAL LOW (ref 39.0–52.0)
Hemoglobin: 11.8 g/dL — ABNORMAL LOW (ref 13.0–17.0)
MCH: 29.8 pg (ref 26.0–34.0)
MCHC: 33.8 g/dL (ref 30.0–36.0)
MCV: 88.1 fL (ref 80.0–100.0)
Platelets: 203 10*3/uL (ref 150–400)
RBC: 3.96 MIL/uL — ABNORMAL LOW (ref 4.22–5.81)
RDW: 16.4 % — ABNORMAL HIGH (ref 11.5–15.5)
WBC: 5.7 10*3/uL (ref 4.0–10.5)
nRBC: 0.9 % — ABNORMAL HIGH (ref 0.0–0.2)

## 2018-06-23 LAB — SODIUM, URINE, RANDOM: Sodium, Ur: 77 mmol/L

## 2018-06-23 LAB — MAGNESIUM: Magnesium: 1.6 mg/dL — ABNORMAL LOW (ref 1.7–2.4)

## 2018-06-23 LAB — OSMOLALITY, URINE: Osmolality, Ur: 580 mOsm/kg (ref 300–900)

## 2018-06-23 LAB — PHOSPHORUS: Phosphorus: 2 mg/dL — ABNORMAL LOW (ref 2.5–4.6)

## 2018-06-23 MED ORDER — MAGNESIUM SULFATE 2 GM/50ML IV SOLN
2.0000 g | Freq: Once | INTRAVENOUS | Status: AC
  Administered 2018-06-23: 2 g via INTRAVENOUS
  Filled 2018-06-23: qty 50

## 2018-06-23 MED ORDER — SPIRONOLACTONE 25 MG PO TABS
50.0000 mg | ORAL_TABLET | Freq: Every day | ORAL | Status: DC
  Administered 2018-06-24: 50 mg via ORAL
  Filled 2018-06-23: qty 2

## 2018-06-23 MED ORDER — SODIUM CHLORIDE 0.9 % IV SOLN: INTRAVENOUS | Status: DC

## 2018-06-23 MED ORDER — LISINOPRIL 10 MG PO TABS
10.0000 mg | ORAL_TABLET | Freq: Every day | ORAL | Status: DC
  Administered 2018-06-23 – 2018-06-25 (×3): 10 mg via ORAL
  Filled 2018-06-23 (×3): qty 1

## 2018-06-23 MED ORDER — SODIUM CHLORIDE 0.9 % IV SOLN
INTRAVENOUS | Status: AC
  Administered 2018-06-23 (×2): via INTRAVENOUS

## 2018-06-23 MED ORDER — FUROSEMIDE 20 MG PO TABS
20.0000 mg | ORAL_TABLET | Freq: Every day | ORAL | Status: DC
  Administered 2018-06-24: 20 mg via ORAL
  Filled 2018-06-23: qty 1

## 2018-06-23 MED ORDER — POTASSIUM CHLORIDE CRYS ER 20 MEQ PO TBCR
40.0000 meq | EXTENDED_RELEASE_TABLET | ORAL | Status: AC
  Administered 2018-06-23 (×3): 40 meq via ORAL
  Filled 2018-06-23 (×3): qty 2

## 2018-06-23 MED ORDER — PREDNISOLONE 5 MG PO TABS
40.0000 mg | ORAL_TABLET | Freq: Every day | ORAL | Status: DC
  Administered 2018-06-23 – 2018-06-25 (×3): 40 mg via ORAL
  Filled 2018-06-23 (×5): qty 8

## 2018-06-23 NOTE — Plan of Care (Signed)

## 2018-06-23 NOTE — Evaluation (Signed)
Physical Therapy Evaluation Patient Details Name: Christopher Rogers MRN: [id number] DOB: [birth year] Today's Date: 06/23/2018   History of Present Illness  Pt is 58 year old male with history of alcohol use disorder, HTN, delirium tremens, depression; admitted on 06/18/2018, confusion and alcohol withdrawal and was found to have delirium tremens.  Initially arrived at [facility].  Patient was transferred to [facility] for ICU care.    Clinical Impression  Pt admitted with above diagnosis. Pt currently with functional limitations due to the deficits listed below (see PT Problem List). PTA, pt living with mother, reports some falls when drinking, otherwise no AD use. Stairs at home. Today patient ambulating unit without physical assistance with unsteady gait and use of intermittent hand rail. Will need to work on stairs next PT visit.  Pt will benefit from skilled PT to increase their independence and safety with mobility to allow discharge to the venue listed below.       Follow Up Recommendations No PT follow up;Supervision for mobility/OOB    Equipment Recommendations  None recommended by PT    Recommendations for Other Services       Precautions / Restrictions Restrictions Weight Bearing Restrictions: No      Mobility  Bed Mobility Overal bed mobility: Modified Independent                Transfers Overall transfer level: Needs assistance Equipment used: Rolling walker (2 wheeled) Transfers: Sit to/from Stand Sit to Stand: Min guard            Ambulation/Gait Ambulation/Gait assistance: Min guard;Supervision Gait Distance (Feet): 200 Feet Assistive device: None Gait Pattern/deviations: Step-to pattern Gait velocity: decreased   General Gait Details: patient with narrow base of support, intermittent use of hand rail. x3 scissoring. can follow cues to correct mechanics, mild unsteaedy no overt LOB without AD  Stairs            Wheelchair  Mobility    Modified Rankin (Stroke Patients Only)       Balance                                             Pertinent Vitals/Pain Pain Assessment: No/denies pain    Home Living Family/patient expects to be discharged to:: Private residence Living Arrangements: Parent Available Help at Discharge: Family;Available 24 hours/day Type of Home: House Home Access: Level entry     Home Layout: Multi-level Home Equipment: None Additional Comments: Pt plans to discharge to his mother's house.     Prior Function Level of Independence: Independent               Hand Dominance        Extremity/Trunk Assessment                Communication   Communication: No difficulties  Cognition Arousal/Alertness: Awake/alert Behavior During Therapy: WFL for tasks assessed/performed Overall Cognitive Status: Within Functional Limits for tasks assessed                                        General Comments      Exercises     Assessment/Plan    PT Assessment Patient needs continued PT services  PT Problem List Decreased strength       PT  Treatment Interventions Gait training;DME instruction;Stair training;Functional mobility training;Therapeutic activities    PT Goals (Current goals can be found in the Care Plan section)  Acute Rehab PT Goals Patient Stated Goal: to regain strength PT Goal Formulation: With patient Potential to Achieve Goals: Good    Frequency Min 3X/week   Barriers to discharge Inaccessible home environment      Co-evaluation               AM-PAC PT "6 Clicks" Mobility  Outcome Measure Help needed turning from your back to your side while in a flat bed without using bedrails?: None Help needed moving from lying on your back to sitting on the side of a flat bed without using bedrails?: None Help needed moving to and from a bed to a chair (including a wheelchair)?: A Little Help needed standing up  from a chair using your arms (e.g., wheelchair or bedside chair)?: A Little Help needed to walk in hospital room?: A Little Help needed climbing 3-5 steps with a railing? : A Little 6 Click Score: 20    End of Session Equipment Utilized During Treatment: Gait belt Activity Tolerance: Patient tolerated treatment well Patient left: in bed;with call bell/phone within reach Nurse Communication: Mobility status PT Visit Diagnosis: Unsteadiness on feet (R26.81)    Time: 1545-1610 PT Time Calculation (min) (ACUTE ONLY): 25 min   Charges:   PT Evaluation $PT Eval Low Complexity: 1 Low PT Treatments $Gait Training: 8-22 mins        Reinaldo Berber, PT, DPT Acute Rehabilitation Services Pager: [phone] Office: [place] 06/23/2018, 4:15 PM

## 2018-06-23 NOTE — Progress Notes (Signed)
PROGRESS NOTE  Christopher Rogers XBJ:[id number] DOB: [birth year] DOA: 06/18/2018 PCP: Algis Greenhouse, MD   LOS: 5 days   Patient is from: home  Brief Narrative / Interim history: 58 year old male with history of alcohol use disorder, HTN, delirium tremens, depression; admitted on 06/18/2018, confusion and alcohol withdrawal and was found to have delirium tremens.  Initially arrived at [facility].  Patient was transferred to [facility] for ICU care.  Status post Precedex drip and phenobarbital. Currently on CIWA protocol with Ativan.  LFT, T bili and alk phos remained elevated.  Hepatitis panel negative.  Abdominal ultrasound showed hepatic steatosis.  Liver Doppler unremarkable.  Gastroenterology consulted on 06/23/2018.   Subjective: No major events overnight of this morning.  Complains about difficulty sleeping.  Denies chest pain, dyspnea or GI symptoms.  No signs of withdrawal.  CIWA score remains low.  Had urinary accident last night.  He says he had this before since he had prostatectomy.  No other urinary symptoms.  Assessment & Plan: Active Problems:   BPH (benign prostatic hyperplasia)   Essential hypertension   Hyperbilirubinemia   Alcohol withdrawal (HCC)   Steatosis of liver   Encounter for nasogastric (NG) tube placement   Protrusion of lumbar intervertebral disc   Elevated liver enzymes   Alkaline phosphatase elevation  Alcohol withdrawal/delirium tremens/acute encephalopathy from alcohol withdrawal-resolved. -Status post Precedex and phenobarbital -CIWA scores remains low. -Continue MedSurg CIWA protocol -Discontinue telemetry -Continue vitamins  Alcoholic hepatitis/elevated liver enzymes/hyperbilirubinemia/elevated alkaline phosphatase-improving. -Abdominal ultrasound shows hepatic steatosis. -Liver Doppler negative for Budd-Chiari syndrome -Hepatitis panel negative. -CK within normal range.  LDH elevated. -Follow haptoglobin. -GI consulted   Hypokalemia/hypomagnesemia: Likely due to alcohol use. -Replenish and recheck  Acute alcoholic pancreatitis-CT abdomen negative -Lipase elevated to 2000 on arrival and normalized -Tolerating diet.  Mild hyponatremia: beer potomania?  He is also on Lexapro which could contribute. -IV normal saline -Urine chemistry  Hypertension: BP slightly elevated. -Continue Coreg 12.5 mg twice daily -Add lisinopril 10 mg daily.  Disc extrusion of L5/S1 with bilateral leg numbness-stable -Pain management with Robaxin and PRN oxycodone -Outpatient neurosurgery follow-up -MRI if concerning neuro finding.  Obesity: BMI 32.87 -Outpatient follow-up for lifestyle change to lose weight  Thrombocytopenia: Stable.  Likely due to alcohol. Improved -Continue monitoring  Normocytic anemia: Likely due to chronic disease and alcohol -Continue monitoring  Major depression: Stable. -Continue home Lexapro  Scheduled Meds: . carvedilol  12.5 mg Oral BID WC  . escitalopram  20 mg Oral QHS  . folic acid  1 mg Oral Daily  . lisinopril  10 mg Oral Daily  . LORazepam  0-4 mg Intravenous Q12H  . multivitamin with minerals  1 tablet Oral Daily  . pantoprazole  40 mg Oral Daily  . potassium chloride  40 mEq Oral Q4H  . senna-docusate  1 tablet Oral QHS  . thiamine  100 mg Oral Daily   Or  . thiamine  100 mg Intravenous Daily   Continuous Infusions: . sodium chloride 75 mL/hr at 06/23/18 0722   PRN Meds:.alum & mag hydroxide-simeth, LORazepam **OR** LORazepam, methocarbamol, ondansetron (ZOFRAN) IV, oxyCODONE   DVT prophylaxis: SCD Code Status: Full code Family Communication: None at bedside Disposition Plan: Remains inpatient due to elevated liver enzymes and hyperbilirubinemia  Consultants:   GI  Procedures:   None  Microbiology: . None  Antimicrobials:  none   Objective: Vitals:   06/22/18 1734 06/22/18 2045 06/23/18 0441 06/23/18 0948  BP: (!) 157/107 (!) 135/98 Marland Kitchen)  146/106 (!)  160/107  Pulse: 71 75 73 81  Resp: '16 16 17 18  ' Temp: 98.5 F (36.9 C) 98.7 F (37.1 C) 98.4 F (36.9 C) 99.7 F (37.6 C)  TempSrc: Oral Oral  Oral  SpO2: 98% 97% 96% 97%  Weight:      Height:        Intake/Output Summary (Last 24 hours) at 06/23/2018 1046 Last data filed at 06/23/2018 0200 Gross per 24 hour  Intake 300 ml  Output 400 ml  Net -100 ml   Filed Weights   06/20/18 0500 06/20/18 2020 06/21/18 2052  Weight: 103.6 kg 103.9 kg 99.9 kg    Examination:  GENERAL: No acute distress.  Appears well.  HEENT: MMM.  Vision and hearing grossly intact.  NECK: Supple.  No JVD.  LUNGS:  No IWOB. Good air movement bilaterally. HEART:  RRR. Heart sounds normal.  ABD: Bowel sounds present. Soft. Non tender.  MSK/EXT:  Moves all extremities. No apparent deformity. No edema bilaterally.  SKIN: no apparent skin lesion or wound NEURO: Awake, alert and oriented appropriately.  No gross deficit.  PSYCH: Calm. Normal affect.  Data Reviewed: I have independently reviewed following labs and imaging studies   CBC: Recent Labs  Lab 06/19/18 0028 06/19/18 0159 06/21/18 0916 06/23/18 0338  WBC  --  9.2 5.9 5.7  NEUTROABS  --  8.2* 4.0  --   HGB 12.6* 12.8* 11.5* 11.8*  HCT 37.0* 38.3* 33.9* 34.9*  MCV  --  89.5 87.6 88.1  PLT  --  48* 130* 619   Basic Metabolic Panel: Recent Labs  Lab 06/19/18 0159 06/20/18 0301 06/21/18 0354 06/22/18 0422 06/23/18 0338  NA 135 138 137 133* 133*  K 4.6 3.4* 3.6 3.6 2.9*  CL 100 103 100 93* 94*  CO2 18* 21* '23 22 27  ' GLUCOSE 98 137* 130* 108* 122*  BUN '19 12 6 10 12  ' CREATININE 1.20 0.98 0.77 0.85 0.72  CALCIUM 6.8* 7.7* 8.4* 8.4* 8.1*  MG 1.8  --   --   --  1.6*  PHOS 3.0  --   --   --  2.0*   GFR: Estimated Creatinine Clearance: 117.8 mL/min (by C-G formula based on SCr of 0.72 mg/dL). Liver Function Tests: Recent Labs  Lab 06/19/18 0159 06/20/18 0301 06/21/18 0354 06/22/18 0422 06/22/18 1225 06/23/18 0338  AST 262*  217* 241* 241*  --  205*  ALT 113* 113* 135* 153*  --  145*  ALKPHOS 166* 158* 192* 282*  --  273*  BILITOT 6.2* 6.7* 9.5* 11.4* 10.6* 10.6*  PROT 5.2* 5.0* 5.5* 6.1*  --  5.1*  ALBUMIN 2.6* 2.5* 2.7* 2.9*  --  2.5*   Recent Labs  Lab 06/20/18 0301 06/21/18 0354 06/22/18 0422  LIPASE 53* 31 32   No results for input(s): AMMONIA in the last 168 hours. Coagulation Profile: Recent Labs  Lab 06/19/18 0404 06/20/18 0301 06/21/18 0354 06/22/18 0422  INR 1.3* 1.5* 1.4* 1.4*   Cardiac Enzymes: Recent Labs  Lab 06/22/18 0442  CKTOTAL 111   BNP (last 3 results) No results for input(s): PROBNP in the last 8760 hours. HbA1C: No results for input(s): HGBA1C in the last 72 hours. CBG: Recent Labs  Lab 06/19/18 1941 06/19/18 2358 06/20/18 0750 06/20/18 1136 06/20/18 1522  GLUCAP 166* 119* 168* 175* 167*   Lipid Profile: No results for input(s): CHOL, HDL, LDLCALC, TRIG, CHOLHDL, LDLDIRECT in the last 72 hours. Thyroid Function Tests: No results  for input(s): TSH, T4TOTAL, FREET4, T3FREE, THYROIDAB in the last 72 hours. Anemia Panel: Recent Labs    06/21/18 0354  VITAMINB12 3,135*   Urine analysis:    Component Value Date/Time   COLORURINE AMBER (A) 06/19/2018 0255   APPEARANCEUR HAZY (A) 06/19/2018 0255   LABSPEC >1.046 (H) 06/19/2018 0255   PHURINE 6.0 06/19/2018 0255   GLUCOSEU NEGATIVE 06/19/2018 0255   HGBUR MODERATE (A) 06/19/2018 0255   BILIRUBINUR SMALL (A) 06/19/2018 0255   KETONESUR 20 (A) 06/19/2018 0255   PROTEINUR 100 (A) 06/19/2018 0255   NITRITE NEGATIVE 06/19/2018 0255   LEUKOCYTESUR TRACE (A) 06/19/2018 0255   Sepsis Labs: Invalid input(s): PROCALCITONIN, LACTICIDVEN  Recent Results (from the past 240 hour(s))  MRSA PCR Screening     Status: None   Collection Time: 06/18/18 11:58 PM  Result Value Ref Range Status   MRSA by PCR NEGATIVE NEGATIVE Final    Comment:        The GeneXpert MRSA Assay (FDA approved for NASAL specimens only), is  one component of a comprehensive MRSA colonization surveillance program. It is not intended to diagnose MRSA infection nor to guide or monitor treatment for MRSA infections. Performed at [facility], [place] [address] [place], [place] [address]   SARS Coronavirus 2 [facility] order, Performed in [facility] hospital lab)     Status: None   Collection Time: 06/19/18 12:47 AM  Result Value Ref Range Status   SARS Coronavirus 2 NEGATIVE NEGATIVE Final    Comment: (NOTE) If result is NEGATIVE SARS-CoV-2 target nucleic acids are NOT DETECTED. The SARS-CoV-2 RNA is generally detectable in upper and lower  respiratory specimens during the acute phase of infection. The lowest  concentration of SARS-CoV-2 viral copies this assay can detect is 250  copies / mL. A negative result does not preclude SARS-CoV-2 infection  and should not be used as the sole basis for treatment or other  patient management decisions.  A negative result may occur with  improper specimen collection / handling, submission of specimen other  than nasopharyngeal swab, presence of viral mutation(s) within the  areas targeted by this assay, and inadequate number of viral copies  (<250 copies / mL). A negative result must be combined with clinical  observations, patient history, and epidemiological information. If result is POSITIVE SARS-CoV-2 target nucleic acids are DETECTED. The SARS-CoV-2 RNA is generally detectable in upper and lower  respiratory specimens dur ing the acute phase of infection.  Positive  results are indicative of active infection with SARS-CoV-2.  Clinical  correlation with patient history and other diagnostic information is  necessary to determine patient infection status.  Positive results do  not rule out bacterial infection or co-infection with other viruses. If result is PRESUMPTIVE POSTIVE SARS-CoV-2 nucleic acids MAY BE PRESENT.   A presumptive positive result was obtained on the  submitted specimen  and confirmed on repeat testing.  While 2019 novel coronavirus  (SARS-CoV-2) nucleic acids may be present in the submitted sample  additional confirmatory testing may be necessary for epidemiological  and / or clinical management purposes  to differentiate between  SARS-CoV-2 and other Sarbecovirus currently known to infect humans.  If clinically indicated additional testing with an alternate test  methodology [phone]) is advised. The SARS-CoV-2 RNA is generally  detectable in upper and lower respiratory sp ecimens during the acute  phase of infection. The expected result is Negative. Fact Sheet for Patients:  StrictlyIdeas.no Fact Sheet for Healthcare Providers: BankingDealers.co.za This  test is not yet approved or cleared by the [place] and has been authorized for detection and/or diagnosis of SARS-CoV-2 by FDA under an Emergency Use Authorization (EUA).  This EUA will remain in effect (meaning this test can be used) for the duration of the COVID-19 declaration under Section 564(b)(1) of the Act, 21 U.S.C. section 360bbb-3(b)(1), unless the authorization is terminated or revoked sooner. Performed at [facility], [place] [address] [place], [place] [address]   Culture, blood (routine x 2)     Status: None (Preliminary result)   Collection Time: 06/19/18  7:10 AM  Result Value Ref Range Status   Specimen Description BLOOD LEFT HAND  Final   Special Requests AEROBIC BOTTLE ONLY Blood Culture adequate volume  Final   Culture   Final    NO GROWTH 4 DAYS Performed at [facility], [place] [address], [place], [place] [address]    Report Status PENDING  Incomplete  Culture, blood (routine x 2)     Status: None (Preliminary result)   Collection Time: 06/19/18  7:20 AM  Result Value Ref Range Status   Specimen Description BLOOD RIGHT HAND  Final   Special Requests AEROBIC BOTTLE ONLY Blood Culture  adequate volume  Final   Culture   Final    NO GROWTH 4 DAYS Performed at [facility], [place] [address] [place], [place] [address]    Report Status PENDING  Incomplete      Radiology Studies: No results found.  Kathline Banbury T. [facility] Pager [phone]  If 7PM-7AM, please contact night-coverage [email or web address] Password [facility] 06/23/2018, 10:46 AM

## 2018-06-23 NOTE — Progress Notes (Signed)
Occupational Therapy Evaluation Patient Details Name: Christopher Rogers MRN: [id number] DOB: [birth year] Today's Date: 06/23/2018    History of Present Illness Pt is 58 year old male with history of alcohol use disorder, HTN, delirium tremens, depression; admitted on 06/18/2018, confusion and alcohol withdrawal and was found to have delirium tremens.  Initially arrived at [facility].  Patient was transferred to [facility] for ICU care.   Clinical Impression   Pt admitted with above diagnosis.  PTA, pt is independent with ADLs. He plans to discharge to his mother's house where he will need to mange 2 flights of stairs to access his bedroom. Pt is min guard with RW during functional mobility, however did require min assist to correct LOB to left side while turning to sit in recliner. Min guard-min assist for BADLs. Will continue to follow acutely in order to maximize safety and independence with ADLs prior to return home.  Recommending 3n1 and 24/7 supervision/assist for home.     Follow Up Recommendations  Supervision/Assistance - 24 hour;No OT follow up    Equipment Recommendations  3 in 1 bedside commode    Recommendations for Other Services PT consult     Precautions / Restrictions Restrictions Weight Bearing Restrictions: No      Mobility Bed Mobility Overal bed mobility: Modified Independent                Transfers Overall transfer level: Needs assistance Equipment used: Rolling walker (2 wheeled) Transfers: Sit to/from Stand Sit to Stand: Min guard              Balance                                           ADL either performed or assessed with clinical judgement   ADL Overall ADL's : Needs assistance/impaired Eating/Feeding: Independent;Sitting   Grooming: Wash/dry hands;Min guard;Standing   Upper Body Bathing: Supervision/ safety;Sitting   Lower Body Bathing: Min guard;Sit to/from stand   Upper Body Dressing :  Supervision/safety;Sitting   Lower Body Dressing: Minimal assistance;Sit to/from stand   Toilet Transfer: Minimal assistance;RW;Regular Toilet;Grab bars   Toileting- Water quality scientist and Hygiene: Min guard;Sit to/from stand       Functional mobility during ADLs: Min guard;Minimal assistance;Rolling walker General ADL Comments: Pt completed toileting and grooming tasks and then ended session in recliner. Min guard with RW during functional mobility with cues for safe use of RW and min assist to correct LOB to left side when turning to sit in recliner.      Vision         Perception     Praxis      Pertinent Vitals/Pain Pain Assessment: No/denies pain     Hand Dominance     Extremity/Trunk Assessment Upper Extremity Assessment Upper Extremity Assessment: Overall WFL for tasks assessed           Communication Communication Communication: No difficulties   Cognition Arousal/Alertness: Awake/alert Behavior During Therapy: WFL for tasks assessed/performed Overall Cognitive Status: Within Functional Limits for tasks assessed                                     General Comments       Exercises     Shoulder Instructions      Home Living Family/patient  expects to be discharged to:: Private residence Living Arrangements: Parent Available Help at Discharge: Family;Available 24 hours/day Type of Home: House Home Access: Level entry     Home Layout: Multi-level Alternate Level Stairs-Number of Steps: 2 full flights to get to his bedroom   Bathroom Shower/Tub: Teacher, early years/pre: Standard     Home Equipment: None   Additional Comments: Pt plans to discharge to his mother's house.       Prior Functioning/Environment Level of Independence: Independent                 OT Problem List: Decreased strength;Decreased activity tolerance;Impaired balance (sitting and/or standing);Decreased knowledge of use of DME or AE       OT Treatment/Interventions: Self-care/ADL training;DME and/or AE instruction;Therapeutic activities;Patient/family education;Balance training    OT Goals(Current goals can be found in the care plan section) Acute Rehab OT Goals Patient Stated Goal: to regain strength OT Goal Formulation: With patient Time For Goal Achievement: 07/07/18 Potential to Achieve Goals: Good  OT Frequency: Min 2X/week   Barriers to D/C:            Co-evaluation              AM-PAC OT "6 Clicks" Daily Activity     Outcome Measure Help from another person eating meals?: None Help from another person taking care of personal grooming?: A Little Help from another person toileting, which includes using toliet, bedpan, or urinal?: A Little Help from another person bathing (including washing, rinsing, drying)?: A Little Help from another person to put on and taking off regular upper body clothing?: A Little Help from another person to put on and taking off regular lower body clothing?: A Little 6 Click Score: 19   End of Session Equipment Utilized During Treatment: Rolling walker Nurse Communication: Mobility status  Activity Tolerance: Patient tolerated treatment well Patient left: in chair;with call bell/phone within reach  OT Visit Diagnosis: Unsteadiness on feet (R26.81);Muscle weakness (generalized) (M62.81)                Time: 1020-1046 OT Time Calculation (min): 26 min Charges:  OT General Charges $OT Visit: 1 Visit OT Evaluation $OT Eval Moderate Complexity: 1 Mod OT Treatments $Self Care/Home Management : 8-22 mins   Darrol Jump OTR/L [place] [phone] 06/23/2018, 11:43 AM

## 2018-06-23 NOTE — Consult Note (Addendum)
[facility] Consult: 10:59 AM 06/23/2018  LOS: 5 days    Referring Provider: Dr Cyndia Skeeters  Primary Care Physician:  Algis Greenhouse, MD in [place] Primary Gastroenterologist:  Dr Phineas Real in [place].       Reason for Consultation:  Alcoholic hepatitis   HPI: Christopher Rogers is a 58 y.o. male.  PMH outlined below.  Alcohol abuse, substance abuse, depression.  Elevated LFTs.  Previous upper endoscopy and colonoscopy with Dr. Melina Copa in [place].  Does not recall findings other than GERD, denies colon polyps. Dr Arna Medici note from 09/2017 mentions he drinks a box of wine daily and chronic alcohol abuse.  10/21/17 LFTs elevated:alk phos 254, AST/ALT 216/123 but normal T bili and albumin.  Hepatitis B surface Ab positive, Hep B core Ab negative, Hep B surface Ag negative, Hep C Ab negative.  Hepatitis A IgG and IgM antibodies negative.  He never went to planned alcohol rehab.   Was in the ED at [facility] 12/2017 with alcohol withdrawal issues.  LFTs continued elevated on assays in November and twice in December 2019.  T bili 1.4, alk phos 218, AST/ALT 184/104 at the max on 12/24/2017.  Had improved in 02/03/2018 two 1.2, 197, 64/59.  At that point he was transferred to [facility] for management of his alcohol withdrawal and treatment of alcoholism and had a second readmission admissions there within weeks.  Admitted 5 days ago CCM, transfer from [facility] with acute alcohol withdrawal.  Drinking heavily for at least 3 weeks had not gotten out of bed for 4 days due to leg numbness.  48 hours of visual hallucination.  At [facility] ED he was hypertensive, tremulous even after Ativan.  He was transferred so that he could be initiated on Precedex for withdrawal.  COVID-19 testing negative. LDH elevated.   T bili 6.2 >>  10.6 Alk phos 166 >> 273 AST/ALT   262/113 >> 205/145 GGT 775.  Lipase normal.    Pt/INR  Platelets 48 >> 203 Acute hepatitis panel serologies all negative. Nominal ultrasound shows fatty liver and normal directional venous flow.   No ascites.  Pt vaguely aware of previous issues with elevated LFTs but was never told he had alcoholic hepatitis.  Pt does not think he has lost weight.  When he drinks his appetite decreases.  Feels like his belly is bigger.   No nausea or vomiting.  Earlier this week he had heartburn which he does not normally have, it is improved with daily oral Protonix.   Has brown stools about every day, last bowel movement was this morning.   Does not use aspirin at home as it upsets his stomach.  Periodically uses 200 to 400 mg a few times a month at most. Drinks up to three 750 ml bottles wine per day.  The longest he is ever gone without alcohol is 3 or 4 months but it is been a while since he had that length of sobriety.   Notes say he is homeless but he says he lives with his mother who  is 53 years old.  She has Crohn's disease.  There is no family history gastric or intestinal cancers.    Past Medical History:  Diagnosis Date  . ADD (attention deficit hyperactivity disorder, inattentive type)   . Anxiety   . Arthritis   . Chest pain    a. 12/2016 Eval @ [facility]- reportedly neg MV w/ hypertensive response to exercise.  . Depression   . Essential hypertension    a. Dx 12/2016.  Marland Kitchen Insomnia   . Morbid obesity ([place])     Past Surgical History:  Procedure Laterality Date  . BICEPS TENDON REPAIR Left   . COLONOSCOPY    . EYE SURGERY    . LUMBAR LAMINECTOMY/DECOMPRESSION MICRODISCECTOMY Right 04/20/2013   Procedure: L3-L4 DECOMPRESSION DISCECTOMY (1 LEVEL);  Surgeon: Melina Schools, MD;  Location: [place];  Service: Orthopedics;  Laterality: Right;  . PROSTATECTOMY N/A 12/07/2014   Procedure: OPEN SUPRAPUBIC PROSTATECTOMY ;  Surgeon: Carolan Clines, MD;   Location: [facility];  Service: Urology;  Laterality: N/A;  . SHOULDER ARTHROSCOPY Left    clavical repair x2  . VARICOSE VEIN SURGERY Bilateral   . WISDOM TOOTH EXTRACTION      Prior to Admission medications   Medication Sig Start Date End Date Taking? Authorizing Provider  carvedilol (COREG) 12.5 MG tablet Take 1 tablet (12.5 mg total) 2 (two) times daily with a meal by mouth. 01/04/17  Yes Sheikh, Omair Latif, DO  escitalopram (LEXAPRO) 20 MG tablet Take 20 mg by mouth at bedtime.    Yes [provider]  aspirin EC 81 MG EC tablet Take 1 tablet (81 mg total) daily by mouth. Patient not taking: Reported on 06/19/2018 01/05/17   Raiford Noble Latif, DO  folic acid (FOLVITE) 1 MG tablet Take 1 tablet (1 mg total) daily by mouth. Patient not taking: Reported on 06/19/2018 01/05/17   Raiford Noble Latif, DO  Multiple Vitamin (MULTIVITAMIN WITH MINERALS) TABS tablet Take 1 tablet daily by mouth. Patient not taking: Reported on 06/19/2018 01/05/17   Raiford Noble Latif, DO  thiamine 100 MG tablet Take 1 tablet (100 mg total) daily by mouth. Patient not taking: Reported on 06/19/2018 01/05/17   Raiford Noble Latif, DO    Scheduled Meds: . carvedilol  12.5 mg Oral BID WC  . escitalopram  20 mg Oral QHS  . folic acid  1 mg Oral Daily  . lisinopril  10 mg Oral Daily  . LORazepam  0-4 mg Intravenous Q12H  . multivitamin with minerals  1 tablet Oral Daily  . pantoprazole  40 mg Oral Daily  . potassium chloride  40 mEq Oral Q4H  . senna-docusate  1 tablet Oral QHS  . thiamine  100 mg Oral Daily   Or  . thiamine  100 mg Intravenous Daily   Infusions: . sodium chloride 75 mL/hr at 06/23/18 0722  . magnesium sulfate 1 - 4 g bolus IVPB     PRN Meds: alum & mag hydroxide-simeth, LORazepam **OR** LORazepam, methocarbamol, ondansetron (ZOFRAN) IV, oxyCODONE   Allergies as of 06/18/2018  . (No Known Allergies)    Family History  Problem Relation Age of Onset  . Other Mother         Alive & Well  . Alzheimer's disease Father     Social History   Socioeconomic History  . Marital status: Married    Spouse name: Not on file  . Number of children: Not on file  . Years of education: Not on file  .  Highest education level: Not on file  Occupational History    Comment: Building control surveyor  Social Needs  . Financial resource strain: Not on file  . Food insecurity:    Worry: Not on file    Inability: Not on file  . Transportation needs:    Medical: Not on file    Non-medical: Not on file  Tobacco Use  . Smoking status: Never Smoker  . Smokeless tobacco: Current User    Types: Chew  Substance and Sexual Activity  . Alcohol use: Yes    Comment: At least two shots of Vodka daily  . Drug use: No  . Sexual activity: Not on file    Comment: 1 can per month  Lifestyle  . Physical activity:    Days per week: Not on file    Minutes per session: Not on file  . Stress: Not on file  Relationships  . Social connections:    Talks on phone: Not on file    Gets together: Not on file    Attends religious service: Not on file    Active member of club or organization: Not on file    Attends meetings of clubs or organizations: Not on file    Relationship status: Not on file  . Intimate partner violence:    Fear of current or ex partner: Not on file    Emotionally abused: Not on file    Physically abused: Not on file    Forced sexual activity: Not on file  Other Topics Concern  . Not on file  Social History Narrative   Lives in [place] with his mother.  Separated from wife x 15 mos.  2 grown children, 4 grandchildren.  Exercises most days of the week - walks on treadmill for an hour - 2.4 mph, no incline.    REVIEW OF SYSTEMS: Constitutional: And weakness improved. ENT:  No nose bleeds Pulm: No shortness of breath and cough. CV:  No palpitations, no LE edema.  Chest pain. GU: Urine is dark.  No hematuria, no frequency GI: Per HPI. Heme: Denies unusual  bleeding or bruising.  Denies previous anemia. Transfusions: No previous blood product transfusions. Neuro: Occasional infrequent headaches, no peripheral tingling or numbness Derm:  No itching, no rash or sores.  Endocrine:  No sweats or chills.  No polyuria or dysuria Immunization: Not Queried. Travel:  None beyond local counties in last few months.    PHYSICAL EXAM: Vital signs in last 24 hours: Vitals:   06/23/18 0441 06/23/18 0948  BP: (!) 146/106 (!) 160/107  Pulse: 73 81  Resp: 17 18  Temp: 98.4 F (36.9 C) 99.7 F (37.6 C)  SpO2: 96% 97%   Wt Readings from Last 3 Encounters:  06/21/18 99.9 kg  01/04/17 97.4 kg  12/07/14 93.4 kg    General: Overweight, pleasant, comfortable WM who looks jaundiced and icteric. Head: No facial asymmetry or swelling.  No signs of head trauma. Eyes: Icteric sclera, no conjunctival pallor.  EOMI. Ears: Not hard of hearing. Nose: No congestion, no discharge. Mouth: Tongue midline.  Oral mucosa moist, pink, clear. Neck: No JVD, no thyromegaly, no masses. Lungs: Clear bilaterally.  No labored breathing or cough. Heart: RRR.  No MRG.  S1, S2 present. Abdomen: Obese, distended, moderately firm.  No HSM, masses, bruits, hernias..   Rectal: Deferred. Musc/Skeltl: No joint redness, swelling, contractures or gross deformities. Extremities: No CCE.  Feet are warm with brisk reperfusion Neurologic: Alert and oriented x3.  Appropriate.  Good historian.  Moves all 4 limbs, strength not tested.  No tremors. Skin: No rashes, no sores, no significant purpura or bruising. Tattoos: None Nodes: No cervical adenopathy. Psych: Pleasant, cooperative, calm.  Fluid speech.  Intake/Output from previous day: 04/29 0701 - 04/30 0700 In: 420 [P.O.:420] Out: 600 [Urine:600] Intake/Output this shift: No intake/output data recorded.  LAB RESULTS: Recent Labs    06/21/18 0916 06/23/18 0338  WBC 5.9 5.7  HGB 11.5* 11.8*  HCT 33.9* 34.9*  PLT 130* 203    BMET Lab Results  Component Value Date   NA 133 (L) 06/23/2018   NA 133 (L) 06/22/2018   NA 137 06/21/2018   K 2.9 (L) 06/23/2018   K 3.6 06/22/2018   K 3.6 06/21/2018   CL 94 (L) 06/23/2018   CL 93 (L) 06/22/2018   CL 100 06/21/2018   CO2 27 06/23/2018   CO2 22 06/22/2018   CO2 23 06/21/2018   GLUCOSE 122 (H) 06/23/2018   GLUCOSE 108 (H) 06/22/2018   GLUCOSE 130 (H) 06/21/2018   BUN 12 06/23/2018   BUN 10 06/22/2018   BUN 6 06/21/2018   CREATININE 0.72 06/23/2018   CREATININE 0.85 06/22/2018   CREATININE 0.77 06/21/2018   CALCIUM 8.1 (L) 06/23/2018   CALCIUM 8.4 (L) 06/22/2018   CALCIUM 8.4 (L) 06/21/2018   LFT Recent Labs    06/21/18 0354 06/22/18 0422 06/22/18 1225 06/23/18 0338  PROT 5.5* 6.1*  --  5.1*  ALBUMIN 2.7* 2.9*  --  2.5*  AST 241* 241*  --  205*  ALT 135* 153*  --  145*  ALKPHOS 192* 282*  --  273*  BILITOT 9.5* 11.4* 10.6* 10.6*  BILIDIR  --   --  6.6*  --   IBILI  --   --  4.0*  --    PT/INR Lab Results  Component Value Date   INR 1.4 (H) 06/22/2018   INR 1.4 (H) 06/21/2018   INR 1.5 (H) 06/20/2018   Hepatitis Panel Recent Labs    06/21/18 0916  HEPBSAG Negative  HCVAB <0.1  HEPAIGM Negative  HEPBIGM Negative   C-Diff No components found for: CDIFF Lipase     Component Value Date/Time   LIPASE 32 06/22/2018 0422    Drugs of Abuse     Component Value Date/Time   LABOPIA NONE DETECTED 09/12/2015 1425   COCAINSCRNUR NONE DETECTED 09/12/2015 1425   LABBENZ NONE DETECTED 09/12/2015 1425   AMPHETMU NONE DETECTED 09/12/2015 1425   THCU NONE DETECTED 09/12/2015 1425   LABBARB NONE DETECTED 09/12/2015 1425     RADIOLOGY STUDIES: US Liver Doppler  Result Date: 06/21/2018 CLINICAL DATA:  58 year old male with a history of elevated LFTs EXAM: DUPLEX ULTRASOUND OF LIVER TECHNIQUE: Color and duplex Doppler ultrasound was performed to evaluate the hepatic in-flow and out-flow vessels. COMPARISON:  None. FINDINGS: Portal Vein  Velocities Main:  23 cm/sec Right:  18 cm/sec Left:  15 cm/sec Hepatic Vein Velocities Right:  19 cm/sec Middle:  55 cm/sec Left:  43 cm/sec Hepatic Artery Velocity:  29 cm/sec Splenic Vein Velocity:  27 cm/sec Varices: Absent Ascites: Absent Spleen volume measures 291 cc IMPRESSION: Unremarkable directed duplex of the hepatic vasculature. Electronically Signed   By: Corrie Mckusick D.O.   On: 06/21/2018 15:57   US Abdomen Limited Ruq  Result Date: 06/21/2018 CLINICAL DATA:  Elevated liver function tests. EXAM: ULTRASOUND ABDOMEN LIMITED RIGHT UPPER QUADRANT COMPARISON:  None. FINDINGS: Gallbladder: No gallstones or wall thickening  visualized. No sonographic Murphy sign noted by sonographer. Common bile duct: Diameter: 4 mm, normal Liver: No focal lesion identified. Increased echogenicity of the liver parenchyma. Portal vein is patent on color Doppler imaging with normal direction of blood flow towards the liver. IMPRESSION: Increased echogenicity of the liver parenchyma suggesting hepatic steatosis. Otherwise, normal exam. Electronically Signed   By: Lorriane Shire M.D.   On: 06/21/2018 15:28     IMPRESSION:   *   Alcoholic hepatitis.   Hepatic steatosis but no cirrhosis per ultrasound.  No coagulopathy.  No ascites. Discriminant function score 38.      *   Chronic alcoholism.  Alcohol withdrawal syndrome improved.  *   Thrombocytopenia, resolved.   Coags normal.    PLAN:     *  Prednisolone 40 mg/day ordered.     Azucena Freed  06/23/2018, 10:59 AM Phone [phone]    Attending physician's note   I have taken a history, examined the patient and reviewed the chart. I agree with the Advanced Practitioner's note, impression and recommendations.  58 year old male admitted with alcohol withdrawal History of chronic alcohol abuse Labs consistent with acute alcoholic hepatitis, discriminant function >32 No evidence of active systemic infection Agree with prednisolone 40 mg daily  Recheck LFT in 7 days, if no significant improvement in bilirubin, If Lille score >0.45 will need to consider stopping the prednisolone (indicates non response to steroids, poor prognosis)   Ascites secondary to acute alcoholic hepatitis: diagnostic pareacentesis ~100-200 cc with cell count, albumin, protein and culture to exclude SBP Start low dose lasix 16m and ladactone 520mdaily  No evidence of hepatic encephalopathy  No evidence of cirrhosis/portal HTN based on imaging and labs  Discussed alcohol cessation   K.Raliegh IpeDenzil Magnuson MD [phone]

## 2018-06-24 ENCOUNTER — Inpatient Hospital Stay (HOSPITAL_COMMUNITY): Payer: BLUE CROSS/BLUE SHIELD

## 2018-06-24 LAB — COMPREHENSIVE METABOLIC PANEL
ALT: 158 U/L — ABNORMAL HIGH (ref 0–44)
AST: 201 U/L — ABNORMAL HIGH (ref 15–41)
Albumin: 2.4 g/dL — ABNORMAL LOW (ref 3.5–5.0)
Alkaline Phosphatase: 359 U/L — ABNORMAL HIGH (ref 38–126)
Anion gap: 10 (ref 5–15)
BUN: 8 mg/dL (ref 6–20)
CO2: 25 mmol/L (ref 22–32)
Calcium: 7.9 mg/dL — ABNORMAL LOW (ref 8.9–10.3)
Chloride: 98 mmol/L (ref 98–111)
Creatinine, Ser: 0.75 mg/dL (ref 0.61–1.24)
GFR calc Af Amer: >60 mL/min (ref >60)
GFR calc non Af Amer: >60 mL/min (ref >60)
Glucose, Bld: 192 mg/dL — ABNORMAL HIGH (ref 70–99)
Potassium: 3.8 mmol/L (ref 3.5–5.1)
Sodium: 133 mmol/L — ABNORMAL LOW (ref 135–145)
Total Bilirubin: 9.4 mg/dL — ABNORMAL HIGH (ref 0.3–1.2)
Total Protein: 5.1 g/dL — ABNORMAL LOW (ref 6.5–8.1)

## 2018-06-24 LAB — CULTURE, BLOOD (ROUTINE X 2)
Culture: NO GROWTH
Culture: NO GROWTH
Special Requests: ADEQUATE
Special Requests: ADEQUATE

## 2018-06-24 LAB — CBC
HCT: 34.3 % — ABNORMAL LOW (ref 39.0–52.0)
Hemoglobin: 11.7 g/dL — ABNORMAL LOW (ref 13.0–17.0)
MCH: 30 pg (ref 26.0–34.0)
MCHC: 34.1 g/dL (ref 30.0–36.0)
MCV: 87.9 fL (ref 80.0–100.0)
Platelets: 240 10*3/uL (ref 150–400)
RBC: 3.9 MIL/uL — ABNORMAL LOW (ref 4.22–5.81)
RDW: 17.2 % — ABNORMAL HIGH (ref 11.5–15.5)
WBC: 5.5 10*3/uL (ref 4.0–10.5)
nRBC: 0.7 % — ABNORMAL HIGH (ref 0.0–0.2)

## 2018-06-24 LAB — MAGNESIUM: Magnesium: 1.9 mg/dL (ref 1.7–2.4)

## 2018-06-24 LAB — PHOSPHORUS: Phosphorus: 1.9 mg/dL — ABNORMAL LOW (ref 2.5–4.6)

## 2018-06-24 MED ORDER — TRAZODONE HCL 50 MG PO TABS
50.0000 mg | ORAL_TABLET | Freq: Every day | ORAL | Status: DC
  Administered 2018-06-24: 50 mg via ORAL
  Filled 2018-06-24: qty 1

## 2018-06-24 NOTE — Progress Notes (Signed)
Occupational Therapy Treatment Patient Details Name: Christopher Rogers MRN: [id number] DOB: 1960-09-02 Today's Date: 06/24/2018    History of present illness Pt is 58 year old male with history of alcohol use disorder, HTN, delirium tremens, depression; admitted on 06/18/2018, confusion and alcohol withdrawal and was found to have delirium tremens.  Initially arrived at [facility].  Patient was transferred to [facility] for ICU care.   OT comments  Pt complete bathing task at the sink with min (A) for balance deficits and encourage to sit for LB. Pt plans to d/c home alone. Pt with cell phone charging at this time with OT borrowed charger in the room. Pt will now have access to family to help arrange assistance upon d/c such as food from grocery store.    Follow Up Recommendations  Supervision/Assistance - 24 hour;No OT follow up    Equipment Recommendations  3 in 1 bedside commode    Recommendations for Other Services PT consult    Precautions / Restrictions Precautions Precautions: Fall       Mobility Bed Mobility Overal bed mobility: Modified Independent                Transfers Overall transfer level: Needs assistance Equipment used: Rolling walker (2 wheeled) Transfers: Sit to/from Stand Sit to Stand: Min guard              Balance                                           ADL either performed or assessed with clinical judgement   ADL Overall ADL's : Needs assistance/impaired Eating/Feeding: Independent;Sitting   Grooming: Wash/dry hands;Min guard;Standing   Upper Body Bathing: Supervision/ safety;Standing   Lower Body Bathing: Min guard;Sit to/from stand Lower Body Bathing Details (indicate cue type and reason): educated to sit down for LB bathing due to balance deficits Upper Body Dressing : Supervision/safety   Lower Body Dressing: Min guard;Sit to/from stand Lower Body Dressing Details (indicate cue type and  reason): encouraged sitting. pt don socks supine in the bed with knee flexion Toilet Transfer: Minimal assistance;RW           Functional mobility during ADLs: Minimal assistance;Rolling walker General ADL Comments: pt with cues for safety with rw     Vision       Perception     Praxis      Cognition Arousal/Alertness: Awake/alert Behavior During Therapy: WFL for tasks assessed/performed Overall Cognitive Status: Within Functional Limits for tasks assessed                                          Exercises     Shoulder Instructions       General Comments      Pertinent Vitals/ Pain       Pain Assessment: No/denies pain  Home Living                                          Prior Functioning/Environment              Frequency  Min 2X/week        Progress Toward Goals  OT Goals(current goals  can now be found in the care plan section)  Progress towards OT goals: Progressing toward goals  Acute Rehab OT Goals Patient Stated Goal: to charge phone OT Goal Formulation: With patient Time For Goal Achievement: 07/07/18 Potential to Achieve Goals: Good ADL Goals Pt Will Perform Upper Body Bathing: with modified independence;sitting Pt Will Perform Lower Body Bathing: with modified independence;sit to/from stand Pt Will Perform Upper Body Dressing: with modified independence;sitting Pt Will Perform Lower Body Dressing: with modified independence;sit to/from stand Pt Will Transfer to Toilet: with modified independence;ambulating;regular height toilet;bedside commode Pt Will Perform Toileting - Clothing Manipulation and hygiene: with modified independence;sit to/from stand Pt Will Perform Tub/Shower Transfer: Tub transfer;with modified independence;ambulating;3 in 1  Plan Discharge plan remains appropriate    Co-evaluation                 AM-PAC OT "6 Clicks" Daily Activity     Outcome Measure   Help from  another person eating meals?: None Help from another person taking care of personal grooming?: A Little Help from another person toileting, which includes using toliet, bedpan, or urinal?: A Little Help from another person bathing (including washing, rinsing, drying)?: A Little Help from another person to put on and taking off regular upper body clothing?: A Little Help from another person to put on and taking off regular lower body clothing?: A Little 6 Click Score: 19    End of Session Equipment Utilized During Treatment: Rolling walker  OT Visit Diagnosis: Unsteadiness on feet (R26.81);Muscle weakness (generalized) (M62.81)   Activity Tolerance Patient tolerated treatment well   Patient Left in chair;with call bell/phone within reach   Nurse Communication Mobility status        Time: 5009-3818 OT Time Calculation (min): 29 min  Charges: OT General Charges $OT Visit: 1 Visit OT Treatments $Self Care/Home Management : 23-37 mins   Jeri Modena, OTR/L  Acute Rehabilitation Services Pager: [phone] Office: [phone] .    Jeri Modena 06/24/2018, 9:44 AM

## 2018-06-24 NOTE — Progress Notes (Signed)
Physical Therapy Treatment Patient Details Name: Christopher Rogers MRN: [id number] DOB: 1960-04-09 Today's Date: 06/24/2018    History of Present Illness Pt is 58 year old male with history of alcohol use disorder, HTN, delirium tremens, depression; admitted on 06/18/2018, confusion and alcohol withdrawal and was found to have delirium tremens.  Initially arrived at [facility].  Patient was transferred to [facility] for ICU care.    PT Comments    Patient ambulating with improved velocity, still with noted weakness and balance deficits he reports is not his baseline. Will cont to follow and trial stairs next PT visit.    Follow Up Recommendations  No PT follow up;Supervision for mobility/OOB     Equipment Recommendations  None recommended by PT    Recommendations for Other Services       Precautions / Restrictions Precautions Precautions: Fall Restrictions Weight Bearing Restrictions: No    Mobility  Bed Mobility Overal bed mobility: Modified Independent                Transfers Overall transfer level: Needs assistance Equipment used: Rolling walker (2 wheeled) Transfers: Sit to/from Stand Sit to Stand: Min guard            Ambulation/Gait Ambulation/Gait assistance: Min Gaffer (Feet): 150 Feet Assistive device: None Gait Pattern/deviations: Step-to pattern Gait velocity: decreased   General Gait Details: continued cueing for BOS, patient with several instances of poor balance and crossed feet as he ambulates. he is aware of whne he does this and able to self correct. but reports this is not baselinse    Marine scientist [place]in (Stroke Patients Only)       Balance Overall balance assessment: Needs assistance   Sitting balance-Leahy Scale: Fair                                      Cognition Arousal/Alertness: Awake/alert Behavior During Therapy:  WFL for tasks assessed/performed Overall Cognitive Status: Within Functional Limits for tasks assessed                                        Exercises      General Comments        Pertinent Vitals/Pain Pain Assessment: No/denies pain    Home Living                      Prior Function            PT Goals (current goals can now be found in the care plan section) Acute Rehab PT Goals Patient Stated Goal: to charge phone PT Goal Formulation: With patient Potential to Achieve Goals: Good Progress towards PT goals: Progressing toward goals    Frequency    Min 3X/week      PT Plan Current plan remains appropriate    Co-evaluation              AM-PAC PT "6 Clicks" Mobility   Outcome Measure  Help needed turning from your back to your side while in a flat bed without using bedrails?: None Help needed moving from lying on your back to sitting on the side of a flat bed without using bedrails?: None Help needed  moving to and from a bed to a chair (including a wheelchair)?: A Little Help needed standing up from a chair using your arms (e.g., wheelchair or bedside chair)?: A Little Help needed to walk in hospital room?: A Little Help needed climbing 3-5 steps with a railing? : A Little 6 Click Score: 20    End of Session Equipment Utilized During Treatment: Gait belt Activity Tolerance: Patient tolerated treatment well Patient left: in bed;with call bell/phone within reach Nurse Communication: Mobility status PT Visit Diagnosis: Unsteadiness on feet (R26.81)     Time: 5258-9483 PT Time Calculation (min) (ACUTE ONLY): 15 min  Charges:  $Gait Training: 8-22 mins                     Reinaldo Berber, PT, DPT Acute Rehabilitation Services Pager: [phone] Office: [place] 06/24/2018, 10:07 AM

## 2018-06-24 NOTE — Progress Notes (Addendum)
Daily Rounding Note  06/24/2018, 10:58 AM  LOS: 6 days   SUBJECTIVE:   Chief complaint: Alcoholic hepatitis. Passing brown stools.  Hungry.  He still is on clears. Urinating well.  OBJECTIVE:         Vital signs in last 24 hours:    Temp:  [98.4 F (36.9 C)-98.9 F (37.2 C)] 98.4 F (36.9 C) (05/01 0414) Pulse Rate:  [60-63] 60 (05/01 0414) Resp:  [18-19] 19 (05/01 0414) BP: (144-149)/(95-113) 144/95 (05/01 0414) SpO2:  [96 %-98 %] 98 % (05/01 0414) Weight:  [101.5 kg] 101.5 kg (04/30 2039) Last BM Date: 06/23/18 Filed Weights   06/20/18 2020 06/21/18 2052 06/23/18 2039  Weight: 103.9 kg 99.9 kg 101.5 kg   General: Obese, does not look acutely ill.  Alert.  Comfortable. Heart: RRR. Chest: Clear bilaterally.  No shortness of breath Abdomen: Soft, protuberant.  Not tender.  Active bowel sounds. Extremities: CCE. Neuro/Psych: Alert.  Oriented x3.  No resting tremors.  Fluid speech.  Able to stand up without assistance  Intake/Output from previous day: 04/30 0701 - 05/01 0700 In: 1763.9 [P.O.:540; I.V.:1223.9] Out: 1350 [Urine:1350]  Intake/Output this shift: No intake/output data recorded.  Lab Results: Recent Labs    06/23/18 0338 06/24/18 0307  WBC 5.7 5.5  HGB 11.8* 11.7*  HCT 34.9* 34.3*  PLT 203 240   BMET Recent Labs    06/22/18 0422 06/23/18 0338 06/24/18 0307  NA 133* 133* 133*  K 3.6 2.9* 3.8  CL 93* 94* 98  CO2 '22 27 25  ' GLUCOSE 108* 122* 192*  BUN '10 12 8  ' CREATININE 0.85 0.72 0.75  CALCIUM 8.4* 8.1* 7.9*   LFT Recent Labs    06/22/18 0422 06/22/18 1225 06/23/18 0338 06/24/18 0307  PROT 6.1*  --  5.1* 5.1*  ALBUMIN 2.9*  --  2.5* 2.4*  AST 241*  --  205* 201*  ALT 153*  --  145* 158*  ALKPHOS 282*  --  273* 359*  BILITOT 11.4* 10.6* 10.6* 9.4*  BILIDIR  --  6.6*  --   --   IBILI  --  4.0*  --   --    PT/INR Recent Labs    06/22/18 0422  LABPROT 16.5*  INR 1.4*    ASSESMENT:   *    Acute alcoholic hepatitis.  Discriminant function 38. Hepatic steatosis on ultrasound.  Ultrasound and doppler ultrasound did not show ascites.   Day 2 prednisolone 40 mg Today's and yesterday's LFTs with lower T bili, lower AST, slight rise ALT and rise in alk phos.    *     Alcoholism, acute on chronic alcohol withdrawal syndrome.   PLAN   *   Continue Prednisolone.  If LFT in 7 days (May 7) show no significant improvement in bilirubin, and Lille score > 0.45 consider stopping prednisolone (indicates non response to steroids, poor prognosis).    *   Total ETOH abstinence.    *  Stopped Lasix and aldactone as he has no ascites  *  Advance to carb mod diet, stop IVF   Azucena Freed  06/24/2018, 10:58 AM Phone [phone]   Attending physician's note   I have taken an interval history, reviewed the chart and examined the patient. I agree with the Advanced Practitioner's note, impression and recommendations.   No ascites Acute alcoholic hepatitis, continue Prednisolone 73m daily for 28 days if T.bili improving Lille score <0.45, but  if no improvement in Bili by Day 7 and Lille score >0.45 should stop the Prednisolone as increased risk for infection/complication and poor prognosis Follow up with [facility] [place] and will need repeat labs Will sign off, available if have any concerns or questions  K. Denzil Magnuson , MD [phone]

## 2018-06-24 NOTE — Progress Notes (Signed)
PROGRESS NOTE  Christopher Rogers UXN:[id number] DOB: 25-Nov-1960 DOA: 06/18/2018 PCP: Algis Greenhouse, MD   LOS: 6 days   Patient is from: home  Brief Narrative / Interim history: 58 year old male with history of alcohol use disorder, HTN, delirium tremens, depression; admitted on 06/18/2018, confusion and alcohol withdrawal and was found to have delirium tremens.  Initially arrived at [facility].  Patient was transferred to [facility] for ICU care.  Status post Precedex drip and phenobarbital. Currently on CIWA protocol with Ativan.  LFT, T bili and alk phos remained elevated.  Hepatitis panel negative.  Abdominal ultrasound showed hepatic steatosis.  Liver Doppler unremarkable.  Gastroenterology consulted on 06/23/2018 and started prednisone 40 mg daily for alcoholic hepatitis given high discriminant function greater than 32.   Subjective: No major events overnight of this morning.  Continues to complain difficulty sleeping.  Asks for trazodone that helped in the past.  No other complaint.  Denies chest pain, dyspnea, abdominal pain or urinary symptoms.  Assessment & Plan: Active Problems:   BPH (benign prostatic hyperplasia)   Essential hypertension   Hyperbilirubinemia   Alcohol withdrawal (HCC)   Steatosis of liver   Encounter for nasogastric (NG) tube placement   Protrusion of lumbar intervertebral disc   Elevated liver enzymes   Alkaline phosphatase elevation  Alcohol withdrawal/delirium tremens/acute encephalopathy from alcohol withdrawal-resolved. -Status post Precedex and phenobarbital -CIWA scores remains low. -Continue MedSurg CIWA protocol -Discontinued telemetry -Continue vitamins  Alcoholic hepatitis/elevated liver enzymes/hyperbilirubinemia/elevated alkaline phosphatase-remains elevated. -Abdominal ultrasound shows hepatic steatosis. -Liver Doppler negative for Budd-Chiari syndrome -Hepatitis panel negative. -CK and haptoglobin elevated.  LDH  elevated. -Appreciate GI input-prednisone 40 mg daily for a week, diagnostic paracentesis and recheck LFT in a week  Hypokalemia/hypomagnesemia: Likely due to alcohol use.  Resolved.  Acute alcoholic pancreatitis-CT abdomen negative -Lipase elevated to 2000 on arrival and normalized -Tolerating diet.  Mild hyponatremia: beer potomania? He is also on Lexapro which could contribute.  Urine chemistry with SIADH picture.  Stable but no improvement with normal saline. -Continue monitoring  Hypertension: BP fairly controlled. -Continue Coreg 12.5 mg twice daily -Added lisinopril 10 mg daily on 4/30.  Disc extrusion of L5/S1 with bilateral leg numbness-stable -Pain management with Robaxin and PRN oxycodone -Outpatient neurosurgery follow-up -MRI if concerning neuro finding.  Obesity: BMI 32.87 -Outpatient follow-up for lifestyle change to lose weight  Thrombocytopenia: Stable.  Likely due to alcohol. Improved -Continue monitoring  Normocytic anemia: Likely due to chronic disease and alcohol -Continue monitoring  Major depression/insomnia:  -Continue home Lexapro -Added nightly trazodone.  Scheduled Meds: . carvedilol  12.5 mg Oral BID WC  . escitalopram  20 mg Oral QHS  . folic acid  1 mg Oral Daily  . furosemide  20 mg Oral Daily  . lisinopril  10 mg Oral Daily  . LORazepam  0-4 mg Intravenous Q12H  . multivitamin with minerals  1 tablet Oral Daily  . pantoprazole  40 mg Oral Daily  . prednisoLONE  40 mg Oral Q breakfast  . senna-docusate  1 tablet Oral QHS  . spironolactone  50 mg Oral Daily  . thiamine  100 mg Oral Daily  . traZODone  50 mg Oral QHS   Continuous Infusions:  PRN Meds:.alum & mag hydroxide-simeth, methocarbamol, ondansetron (ZOFRAN) IV, oxyCODONE   DVT prophylaxis: SCD Code Status: Full code Family Communication: None at bedside Disposition Plan: Remains inpatient pending diagnostic paracentesis and fluid study.  Anticipate discharge in the next 24  hours if acsitic  fluid study not impressive.  Consultants:   GI  Procedures:   None  Microbiology: . None  Antimicrobials:  none   Objective: Vitals:   06/23/18 0948 06/23/18 1820 06/23/18 2039 06/24/18 0414  BP: (!) 160/107 (!) 146/100 (!) 149/113 (!) 144/95  Pulse: 81 63 62 60  Resp: '18 18 19 19  ' Temp: 99.7 F (37.6 C) 98.9 F (37.2 C) 98.4 F (36.9 C) 98.4 F (36.9 C)  TempSrc: Oral Oral Oral Oral  SpO2: 97% 97% 96% 98%  Weight:   101.5 kg   Height:        Intake/Output Summary (Last 24 hours) at 06/24/2018 1111 Last data filed at 06/24/2018 0530 Gross per 24 hour  Intake 1643.94 ml  Output 1150 ml  Net 493.94 ml   Filed Weights   06/20/18 2020 06/21/18 2052 06/23/18 2039  Weight: 103.9 kg 99.9 kg 101.5 kg    Examination:  GENERAL: No acute distress.  Sitting on bedside chair. HEENT: MMM.  Vision and hearing grossly intact.  Sclera icteric NECK: Supple.  No JVD.  LUNGS:  No IWOB. Good air movement bilaterally. HEART:  RRR. Heart sounds normal.  ABD: Bowel sounds present. Soft. Non tender.  MSK/EXT:  Moves all extremities. No apparent deformity. No edema bilaterally.  SKIN: no apparent skin lesion or wound NEURO: Awake, alert and oriented appropriately.  No gross deficit.  PSYCH: Calm. Normal affect.  Data Reviewed: I have independently reviewed following labs and imaging studies   CBC: Recent Labs  Lab 06/19/18 0028 06/19/18 0159 06/21/18 0916 06/23/18 0338 06/24/18 0307  WBC  --  9.2 5.9 5.7 5.5  NEUTROABS  --  8.2* 4.0  --   --   HGB 12.6* 12.8* 11.5* 11.8* 11.7*  HCT 37.0* 38.3* 33.9* 34.9* 34.3*  MCV  --  89.5 87.6 88.1 87.9  PLT  --  48* 130* 203 027   Basic Metabolic Panel: Recent Labs  Lab 06/19/18 0159 06/20/18 0301 06/21/18 0354 06/22/18 0422 06/23/18 0338 06/24/18 0307  NA 135 138 137 133* 133* 133*  K 4.6 3.4* 3.6 3.6 2.9* 3.8  CL 100 103 100 93* 94* 98  CO2 18* 21* '23 22 27 25  ' GLUCOSE 98 137* 130* 108* 122* 192*   BUN '19 12 6 10 12 8  ' CREATININE 1.20 0.98 0.77 0.85 0.72 0.75  CALCIUM 6.8* 7.7* 8.4* 8.4* 8.1* 7.9*  MG 1.8  --   --   --  1.6* 1.9  PHOS 3.0  --   --   --  2.0* 1.9*   GFR: Estimated Creatinine Clearance: 118.7 mL/min (by C-G formula based on SCr of 0.75 mg/dL). Liver Function Tests: Recent Labs  Lab 06/20/18 0301 06/21/18 0354 06/22/18 0422 06/22/18 1225 06/23/18 0338 06/24/18 0307  AST 217* 241* 241*  --  205* 201*  ALT 113* 135* 153*  --  145* 158*  ALKPHOS 158* 192* 282*  --  273* 359*  BILITOT 6.7* 9.5* 11.4* 10.6* 10.6* 9.4*  PROT 5.0* 5.5* 6.1*  --  5.1* 5.1*  ALBUMIN 2.5* 2.7* 2.9*  --  2.5* 2.4*   Recent Labs  Lab 06/20/18 0301 06/21/18 0354 06/22/18 0422  LIPASE 53* 31 32   No results for input(s): AMMONIA in the last 168 hours. Coagulation Profile: Recent Labs  Lab 06/19/18 0404 06/20/18 0301 06/21/18 0354 06/22/18 0422  INR 1.3* 1.5* 1.4* 1.4*   Cardiac Enzymes: Recent Labs  Lab 06/22/18 0442  CKTOTAL 111   BNP (last 3  results) No results for input(s): PROBNP in the last 8760 hours. HbA1C: No results for input(s): HGBA1C in the last 72 hours. CBG: Recent Labs  Lab 06/19/18 1941 06/19/18 2358 06/20/18 0750 06/20/18 1136 06/20/18 1522  GLUCAP 166* 119* 168* 175* 167*   Lipid Profile: No results for input(s): CHOL, HDL, LDLCALC, TRIG, CHOLHDL, LDLDIRECT in the last 72 hours. Thyroid Function Tests: No results for input(s): TSH, T4TOTAL, FREET4, T3FREE, THYROIDAB in the last 72 hours. Anemia Panel: No results for input(s): VITAMINB12, FOLATE, FERRITIN, TIBC, IRON, RETICCTPCT in the last 72 hours. Urine analysis:    Component Value Date/Time   COLORURINE AMBER (A) 06/19/2018 0255   APPEARANCEUR HAZY (A) 06/19/2018 0255   LABSPEC >1.046 (H) 06/19/2018 0255   PHURINE 6.0 06/19/2018 0255   GLUCOSEU NEGATIVE 06/19/2018 0255   HGBUR MODERATE (A) 06/19/2018 0255   BILIRUBINUR SMALL (A) 06/19/2018 0255   KETONESUR 20 (A) 06/19/2018 0255    PROTEINUR 100 (A) 06/19/2018 0255   NITRITE NEGATIVE 06/19/2018 0255   LEUKOCYTESUR TRACE (A) 06/19/2018 0255   Sepsis Labs: Invalid input(s): PROCALCITONIN, LACTICIDVEN  Recent Results (from the past 240 hour(s))  MRSA PCR Screening     Status: None   Collection Time: 06/18/18 11:58 PM  Result Value Ref Range Status   MRSA by PCR NEGATIVE NEGATIVE Final    Comment:        The GeneXpert MRSA Assay (FDA approved for NASAL specimens only), is one component of a comprehensive MRSA colonization surveillance program. It is not intended to diagnose MRSA infection nor to guide or monitor treatment for MRSA infections. Performed at [facility], [place] [address], [place], [place] [address]   SARS Coronavirus 2 [facility] order, Performed in [facility] hospital lab)     Status: None   Collection Time: 06/19/18 12:47 AM  Result Value Ref Range Status   SARS Coronavirus 2 NEGATIVE NEGATIVE Final    Comment: (NOTE) If result is NEGATIVE SARS-CoV-2 target nucleic acids are NOT DETECTED. The SARS-CoV-2 RNA is generally detectable in upper and lower  respiratory specimens during the acute phase of infection. The lowest  concentration of SARS-CoV-2 viral copies this assay can detect is 250  copies / mL. A negative result does not preclude SARS-CoV-2 infection  and should not be used as the sole basis for treatment or other  patient management decisions.  A negative result may occur with  improper specimen collection / handling, submission of specimen other  than nasopharyngeal swab, presence of viral mutation(s) within the  areas targeted by this assay, and inadequate number of viral copies  (<250 copies / mL). A negative result must be combined with clinical  observations, patient history, and epidemiological information. If result is POSITIVE SARS-CoV-2 target nucleic acids are DETECTED. The SARS-CoV-2 RNA is generally detectable in upper and lower  respiratory specimens dur ing  the acute phase of infection.  Positive  results are indicative of active infection with SARS-CoV-2.  Clinical  correlation with patient history and other diagnostic information is  necessary to determine patient infection status.  Positive results do  not rule out bacterial infection or co-infection with other viruses. If result is PRESUMPTIVE POSTIVE SARS-CoV-2 nucleic acids MAY BE PRESENT.   A presumptive positive result was obtained on the submitted specimen  and confirmed on repeat testing.  While 2019 novel coronavirus  (SARS-CoV-2) nucleic acids may be present in the submitted sample  additional confirmatory testing may be necessary for epidemiological  and / or  clinical management purposes  to differentiate between  SARS-CoV-2 and other Sarbecovirus currently known to infect humans.  If clinically indicated additional testing with an alternate test  methodology [id number]) is advised. The SARS-CoV-2 RNA is generally  detectable in upper and lower respiratory sp ecimens during the acute  phase of infection. The expected result is Negative. Fact Sheet for Patients:  StrictlyIdeas.no Fact Sheet for Healthcare Providers: BankingDealers.co.za This test is not yet approved or cleared by the [place] FDA and has been authorized for detection and/or diagnosis of SARS-CoV-2 by FDA under an Emergency Use Authorization (EUA).  This EUA will remain in effect (meaning this test can be used) for the duration of the COVID-19 declaration under Section 564(b)(1) of the Act, 21 U.S.C. section 360bbb-3(b)(1), unless the authorization is terminated or revoked sooner. Performed at [facility], [place] [address] [place], [place] [address]   Culture, blood (routine x 2)     Status: None (Preliminary result)   Collection Time: 06/19/18  7:10 AM  Result Value Ref Range Status   Specimen Description BLOOD LEFT HAND  Final   Special Requests  AEROBIC BOTTLE ONLY Blood Culture adequate volume  Final   Culture   Final    NO GROWTH 4 DAYS Performed at [facility], [place] [address] [place], [place] [address]    Report Status PENDING  Incomplete  Culture, blood (routine x 2)     Status: None (Preliminary result)   Collection Time: 06/19/18  7:20 AM  Result Value Ref Range Status   Specimen Description BLOOD RIGHT HAND  Final   Special Requests AEROBIC BOTTLE ONLY Blood Culture adequate volume  Final   Culture   Final    NO GROWTH 4 DAYS Performed at [facility], [place] [address] [place], [place] [address]    Report Status PENDING  Incomplete      Radiology Studies: No results found.  Emmilyn Crooke T. [facility] Pager [phone]  If 7PM-7AM, please contact night-coverage [email or web address] Password [email or web address] 06/24/2018, 11:11 AM

## 2018-06-25 DIAGNOSIS — F10230 Alcohol dependence with withdrawal, uncomplicated: Secondary | ICD-10-CM

## 2018-06-25 LAB — COMPREHENSIVE METABOLIC PANEL
ALT: 174 U/L — ABNORMAL HIGH (ref 0–44)
AST: 216 U/L — ABNORMAL HIGH (ref 15–41)
Albumin: 2.5 g/dL — ABNORMAL LOW (ref 3.5–5.0)
Alkaline Phosphatase: 406 U/L — ABNORMAL HIGH (ref 38–126)
Anion gap: 11 (ref 5–15)
BUN: 7 mg/dL (ref 6–20)
CO2: 25 mmol/L (ref 22–32)
Calcium: 8.2 mg/dL — ABNORMAL LOW (ref 8.9–10.3)
Chloride: 99 mmol/L (ref 98–111)
Creatinine, Ser: 0.76 mg/dL (ref 0.61–1.24)
GFR calc Af Amer: >60 mL/min (ref >60)
GFR calc non Af Amer: >60 mL/min (ref >60)
Glucose, Bld: 140 mg/dL — ABNORMAL HIGH (ref 70–99)
Potassium: 3.2 mmol/L — ABNORMAL LOW (ref 3.5–5.1)
Sodium: 135 mmol/L (ref 135–145)
Total Bilirubin: 8.2 mg/dL — ABNORMAL HIGH (ref 0.3–1.2)
Total Protein: 5 g/dL — ABNORMAL LOW (ref 6.5–8.1)

## 2018-06-25 LAB — PHOSPHORUS: Phosphorus: 1.8 mg/dL — ABNORMAL LOW (ref 2.5–4.6)

## 2018-06-25 LAB — MAGNESIUM: Magnesium: 1.7 mg/dL (ref 1.7–2.4)

## 2018-06-25 MED ORDER — LISINOPRIL 10 MG PO TABS: 10.0000 mg | ORAL_TABLET | Freq: Every day | ORAL | 0 refills | Status: DC

## 2018-06-25 MED ORDER — POTASSIUM CHLORIDE CRYS ER 20 MEQ PO TBCR
40.0000 meq | EXTENDED_RELEASE_TABLET | Freq: Once | ORAL | Status: AC
  Administered 2018-06-25: 40 meq via ORAL
  Filled 2018-06-25: qty 2

## 2018-06-25 MED ORDER — PREDNISOLONE 5 MG PO TABS: 40.0000 mg | ORAL_TABLET | Freq: Every day | ORAL | 0 refills | Status: DC

## 2018-06-25 MED ORDER — POTASSIUM PHOSPHATES 15 MMOLE/5ML IV SOLN
30.0000 mmol | Freq: Once | INTRAVENOUS | Status: AC
  Administered 2018-06-25: 30 mmol via INTRAVENOUS
  Filled 2018-06-25: qty 10

## 2018-06-25 MED ORDER — MAGNESIUM SULFATE IN D5W 1-5 GM/100ML-% IV SOLN
1.0000 g | Freq: Once | INTRAVENOUS | Status: AC
  Administered 2018-06-25: 1 g via INTRAVENOUS
  Filled 2018-06-25: qty 100

## 2018-06-25 MED ORDER — PANTOPRAZOLE SODIUM 40 MG PO TBEC: 40.0000 mg | DELAYED_RELEASE_TABLET | Freq: Every day | ORAL | 0 refills | Status: DC

## 2018-06-25 NOTE — Progress Notes (Signed)
Occupational Therapy Treatment Patient Details Name: Christopher Rogers MRN: [id number] DOB: 12/18/1960 Today's Date: 06/25/2018    History of present illness Pt is 58 year old male with history of alcohol use disorder, HTN, delirium tremens, depression; admitted on 06/18/2018, confusion and alcohol withdrawal and was found to have delirium tremens.  Initially arrived at [facility].  Patient was transferred to [facility] for ICU care.   OT comments  Pt eager to discharge home, and easily distracted by IV irritation.  Administered the Pill box test to pt.  He made > 30 errors which is a failing score (> 3 errors = fail).   When his errors were brought to his attention, he acknowledged the error, and reattempted the task, but still unable to complete it safely.  Recommend he have direct supervision with medication management, finances, and cooking.  He agrees with this and gave me permission to speak with his mother.  Discussed the results of the test with her, and the need for supervision.  She states he frequently forgets to take his meds for days at a time, but she will attempt to monitor him.    Follow Up Recommendations  Supervision/Assistance - 24 hour;No OT follow up    Equipment Recommendations  3 in 1 bedside commode    Recommendations for Other Services      Precautions / Restrictions Precautions Precautions: Fall Restrictions Weight Bearing Restrictions: No       Mobility Bed Mobility Overal bed mobility: Modified Independent                Transfers Overall transfer level: Needs assistance Equipment used: Rolling walker (2 wheeled) Transfers: Sit to/from Stand Sit to Stand: Min guard              Balance Overall balance assessment: Needs assistance   Sitting balance-Leahy Scale: Fair                                     ADL either performed or assessed with clinical judgement   ADL                                                Vision       Perception     Praxis      Cognition Arousal/Alertness: Awake/alert Behavior During Therapy: WFL for tasks assessed/performed Overall Cognitive Status: Impaired/Different from baseline Area of Impairment: Attention;Safety/judgement;Awareness;Problem solving                   Current Attention Level: Selective     Safety/Judgement: Decreased awareness of safety;Decreased awareness of deficits Awareness: Intellectual Problem Solving: Difficulty sequencing;Requires verbal cues General Comments: Pt very irritable today.  Administered the pill box test.  He made > 30 errors with no awareness.  When errors were pointed out to him, he acknowledged them, and tried again to complete the task correctly, but unable to do so. Discussed with him the need for direct supervision with medications, finances, cooking, and no driving         Exercises     Shoulder Instructions       General Comments with pt's approval, I spoke with his mother re: need for direct supervision with medication management, finances and cooking.  She reports he frequently  forgets to take medications and much of his compliance depends on his drinking. She stated she would attempt to monitor him     Pertinent Vitals/ Pain          Home Living                                          Prior Functioning/Environment              Frequency  Min 2X/week        Progress Toward Goals  OT Goals(current goals can now be found in the care plan section)  Progress towards OT goals: Progressing toward goals  Acute Rehab OT Goals Patient Stated Goal: to charge phone  Plan Discharge plan remains appropriate    Co-evaluation                 AM-PAC OT "6 Clicks" Daily Activity     Outcome Measure   Help from another person eating meals?: None Help from another person taking care of personal grooming?: A Little Help from another person  toileting, which includes using toliet, bedpan, or urinal?: A Little Help from another person bathing (including washing, rinsing, drying)?: A Little Help from another person to put on and taking off regular upper body clothing?: A Little Help from another person to put on and taking off regular lower body clothing?: A Little 6 Click Score: 19    End of Session    OT Visit Diagnosis: Unsteadiness on feet (R26.81);Muscle weakness (generalized) (M62.81)   Activity Tolerance Patient tolerated treatment well   Patient Left in chair;with call bell/phone within reach   Nurse Communication Mobility status        Time: 0233-4356 OT Time Calculation (min): 18 min  Charges: OT General Charges $OT Visit: 1 Visit OT Treatments $Therapeutic Activity: 8-22 mins  Lucille Passy, OTR/L [place] Pager [phone] Office [phone]    Lucille Passy M 06/25/2018, 2:40 PM

## 2018-06-25 NOTE — Discharge Instructions (Signed)
Alcoholic Liver Disease  Alcoholic liver disease is liver damage that is caused by drinking a lot of alcohol for a long time. If you have this disease, you must stop drinking alcohol. Follow these instructions at home:   Do not drink alcohol. Follow your treatment plan. Work with your doctor if you need help.  Think about joining an alcohol support group.  Take over-the-counter and prescription medicines only as told by your doctor. These include vitamins.  Do not use medicines or eat foods that have alcohol in them, unless your doctor says that it is safe.  Follow instructions from your doctor about eating a healthy diet.  Keep all follow-up visits as told by your doctor. This is important. Contact a doctor if:  You get a fever.  Your skin starts to look more yellow, pale, or dark.  You get headaches. Get help right away if:  You throw up (vomit) blood.  You have bright red blood in your poop (stool).  Your poop looks black or looks like tar.  You have trouble: ? Thinking. ? Walking. ? Balancing. ? Breathing. Summary  Alcoholic liver disease is liver damage that is caused by drinking a lot of alcohol for a long time.  If you have this disease, you must stop drinking alcohol. Follow your treatment plan, and work with your doctor as needed.  Think about joining an alcohol support group. This information is not intended to replace advice given to you by your health care provider. Make sure you discuss any questions you have with your health care provider. Document Released: 12/07/2008 Document Revised: 10/30/2016 Document Reviewed: 10/30/2016 Elsevier Interactive Patient Education  2019 [employer]   Acute Pancreatitis  Acute pancreatitis happens when the pancreas gets swollen. The pancreas is a large gland behind the stomach. The pancreas helps control blood sugar. It also makes enzymes that help digest food. This condition happens when the enzymes attack the  pancreas and damage it. Most attacks last a couple of days and are dangerous. The lungs, heart, and kidneys may stop working. What are the causes?  Alcohol abuse.  Drug abuse.  Gallstones.  Some medicines.  Some chemicals.  Infection.  Damage caused by an accident.Christopher Rogers (abdominal) surgery.  In some cases, the cause is not known. What are the signs or symptoms?  Pain in the upper belly and back.  Swelling of the belly  Feeling sick to your stomach (nausea) and throwing up (vomiting). How is this treated?  You will probably have to stay in the hospital. ? Treatment may include:  Fluid through an IV.  A tube to remove stomach contents and stop you from throwing up.  Not eating for 3-4 days.  Pain medicine.  Antibiotic medicines if you have an infection.  Surgery on the pancreas or gallbladder. Follow these instructions at home: Eating and drinking   Follow instructions from your doctor about diet.  Eat small meals often. Avoid eating big meals.  Eat foods that do not have a lot of fat in them.  Drink enough fluid to keep your pee (urine) pale yellow.  Do not drink alcohol if it caused your condition. General instructions  Take over-the-counter and prescription medicines only as told by your doctor.  Do not use cigarettes, e-cigarettes, and chewing tobacco. If you need help quitting, ask your doctor.  Get plenty of rest.  If directed, check your blood sugar at home as told by your doctor.  Keep all follow-up visits as told  by your doctor. This is important. Contact a doctor if:  You do not get better as quickly as expected.  You have new symptoms.  Your symptoms get worse.  You have lasting pain or weakness.  You continue to feel sick to your stomach.  You get better and then you have another pain attack.  You have a fever. Get help right away if:  You cannot eat or keep fluids down.  Your pain becomes very bad.  Your skin or the  white part of your eyes turns yellow.  You throw up.  You feel dizzy or you pass out.  Your blood sugar is high (over 300 mg/dL). Summary  Acute pancreatitis happens when the pancreas gets swollen.  This condition is usually caused by alcohol abuse, drug abuse, or gallstones.  You will probably have to stay in the hospital for treatment. This information is not intended to replace advice given to you by your health care provider. Make sure you discuss any questions you have with your health care provider. Document Released: 07/29/2007 Document Revised: 06/15/2016 Document Reviewed: 11/13/2014 Elsevier Interactive Patient Education  2019 [employer].

## 2018-06-25 NOTE — Progress Notes (Signed)
Physical Therapy Treatment Patient Details Name: Christopher Rogers MRN: [id number] DOB: 04-Nov-1960 Today's Date: 06/25/2018    History of Present Illness Pt is 58 year old male with history of alcohol use disorder, HTN, delirium tremens, depression; admitted on 06/18/2018, confusion and alcohol withdrawal and was found to have delirium tremens.  Initially arrived at [facility].  Patient was transferred to [facility] for ICU care.    PT Comments    Patient doing well toady with therapy, improved gait mechanics, stability. Performed stairs without assistance, cued for safety on stairs as he reports his legs feel weaker than prior to admission.  Cont to rec No PT follow up.    Follow Up Recommendations  No PT follow up;Supervision for mobility/OOB     Equipment Recommendations  None recommended by PT    Recommendations for Other Services       Precautions / Restrictions Precautions Precautions: Fall Restrictions Weight Bearing Restrictions: No    Mobility  Bed Mobility Overal bed mobility: Modified Independent                Transfers Overall transfer level: Needs assistance Equipment used: Rolling walker (2 wheeled) Transfers: Sit to/from Stand Sit to Stand: Min guard            Ambulation/Gait Ambulation/Gait assistance: Min Gaffer (Feet): 120 Feet Assistive device: None Gait Pattern/deviations: Step-to pattern Gait velocity: decreased   General Gait Details: continued cueing for BOS, patient with several instances of poor balance and crossed feet as he ambulates. he is aware of whne he does this and able to self correct. but reports this is not baselinse    Stairs Stairs: Yes Stairs assistance: Supervision Stair Management: One rail Right Number of Stairs: 16 General stair comments: Multiple trials of stairs, sideways using rail BUE, single, alternating    Wheelchair Mobility    Modified Rankin (Stroke  Patients Only)       Balance Overall balance assessment: Needs assistance   Sitting balance-Leahy Scale: Fair                                      Cognition Arousal/Alertness: Awake/alert Behavior During Therapy: WFL for tasks assessed/performed Overall Cognitive Status: Within Functional Limits for tasks assessed                                        Exercises      General Comments        Pertinent Vitals/Pain      Home Living                      Prior Function            PT Goals (current goals can now be found in the care plan section) Acute Rehab PT Goals Patient Stated Goal: to charge phone PT Goal Formulation: With patient Potential to Achieve Goals: Good Progress towards PT goals: Progressing toward goals    Frequency    Min 3X/week      PT Plan Current plan remains appropriate    Co-evaluation              AM-PAC PT "6 Clicks" Mobility   Outcome Measure  Help needed turning from your back to your side while in a flat bed without  using bedrails?: None Help needed moving from lying on your back to sitting on the side of a flat bed without using bedrails?: None Help needed moving to and from a bed to a chair (including a wheelchair)?: A Little Help needed standing up from a chair using your arms (e.g., wheelchair or bedside chair)?: A Little Help needed to walk in hospital room?: A Little Help needed climbing 3-5 steps with a railing? : A Little 6 Click Score: 20    End of Session Equipment Utilized During Treatment: Gait belt Activity Tolerance: Patient tolerated treatment well Patient left: in bed;with call bell/phone within reach Nurse Communication: Mobility status PT Visit Diagnosis: Unsteadiness on feet (R26.81)     Time: 9924-2683 PT Time Calculation (min) (ACUTE ONLY): 12 min  Charges:  $Gait Training: 8-22 mins                    Reinaldo Berber, PT, DPT Acute Rehabilitation  Services Pager: [phone] Office: [place] 06/25/2018, 12:01 PM

## 2018-06-25 NOTE — Discharge Summary (Signed)
Physician Discharge Summary  Christopher Rogers MGN:[id number] DOB: 1960-09-15 DOA: 06/18/2018  PCP: Algis Greenhouse, MD  Admit date: 06/18/2018 Discharge date: 06/25/2018  Admitted From: Home Disposition: Home  Recommendations for Outpatient Follow-up:  1. Follow up with PCP in 1 week 2. Please obtain CMP at follow-up 3. Discontinue prednisolone if no significant improvement in bilirubin, and Lilly score greater than 0.45.  Otherwise, continue for total of 4 weeks zero 5/28 and wean slowly after that. 4. Urgent referral to gastroenterology 5. Please follow up on the following pending results: None  Home Health: None Equipment/Devices: 3 in 1 commode  Discharge Condition: Stable CODE STATUS: Full code  Hospital Course: 58 year old male with history of alcohol use disorder, HTN, delirium tremens, depression; admitted on4/25/2020, confusion and alcohol withdrawal and was found to havedelirium tremens.Initially arrived at [facility]. Patient was transferred to [facility] for ICU care.  Status post Precedex drip and phenobarbital. Currently on CIWA protocol with Ativan.  LFT, T bili and alk phos remained elevated.  Hepatitis panel negative.  Abdominal ultrasound showed hepatic steatosis.  Liver Doppler unremarkable.  Gastroenterology consulted on 06/23/2018 and started prednisone 40 mg daily for alcoholic hepatitis given high discriminant function greater than 32.  He was also started on Lasix and Spironolactone. Paracentesis ordered but patient drainable ascitic fluid for diagnostic para. Lasix and spironolactone discontinued.  Patient discharged on prednisolone 40 mg daily for alcoholic hepatitis/hyperbilirubinemia per GI recommendation.  The plan is to continue for a total of 28 days (through 07/21/2018) if there is significant improvement in his total bilirubin and his Lille score is less than 0.45 by day 7 (06/30/2018).  If no improvement in his total bilirubin and Lille  score is greater than 0.45, the recommendation is to discontinue prednisolone as there will be increased risk of infection/complication and poor prognosis. Please obtain LFT by 07/01/18 referral to gastroenterology urgently.  Of note, prednisolone tablets were not available at local pharmacies in [place] at time of discharge.  I outlined the pharmacist to change it to liquid until they receive a tablet form. This has been explained to patient prior to discharge  Patient was encouraged to quit drinking.  He says he is determined and very confident.  See individual problem list below for more.  Discharge Diagnoses:  Active Problems:   BPH (benign prostatic hyperplasia)   Essential hypertension   Hyperbilirubinemia   Alcohol withdrawal (HCC)   Steatosis of liver   Encounter for nasogastric (NG) tube placement   Protrusion of lumbar intervertebral disc   Elevated liver enzymes   Alkaline phosphatase elevation  Alcohol withdrawal/delirium tremens/acute encephalopathy from alcohol withdrawal-resolved. -Status post Precedex and phenobarbital -CIWA scores remains low. -Continue vitamins -Encouraged to quit drinking  Alcoholic hepatitis/elevated liver enzymes/hyperbilirubinemia/elevated alkaline phosphatase-remains elevated. -Abdominal ultrasound shows hepatic steatosis but no ascitis. -Liver Doppler negative for Budd-Chiari syndrome -Hepatitis panel negative. -CK and haptoglobin elevated.  LDH elevated. -Appreciate GI recommendations as above -Encouraged to quit drinking  Hypokalemia/hypomagnesemia: Likely due to alcohol use.  Resolved.  Acute alcoholic pancreatitis-CT abdomen negative -Lipase elevated to 2000 on arrival and normalized -Tolerating diet. -Encouraged to quit drinking  Mild hyponatremia: beer potomania? He is also on Lexapro which could contribute.  Urine chemistry with SIADH picture.   Resolved.  Hypertension: BP fairly controlled. -Continue Coreg 12.5 mg  twice daily -Added lisinopril 10 mg daily on 4/30.  Disc extrusion of L5/S1 with bilateral leg numbness-stable -Pain management with Robaxin and PRN oxycodone -Outpatient neurosurgery follow-up -MRI  if concerning neuro finding.  Obesity: BMI 32.87 -Outpatient follow-up for lifestyle change to lose weight  Thrombocytopenia: Likely due to alcohol.  Resolved. -Continue monitoring  Normocytic anemia: Likely due to chronic disease and alcohol.  Stable.  Major depression/insomnia:  -Continue home Lexapro   Discharge Instructions  Discharge Instructions    Call MD for:  difficulty breathing, headache or visual disturbances   Complete by:  As directed    Call MD for:  extreme fatigue   Complete by:  As directed    Call MD for:  persistant dizziness or light-headedness   Complete by:  As directed    Call MD for:  persistant nausea and vomiting   Complete by:  As directed    Call MD for:  severe uncontrolled pain   Complete by:  As directed    Call MD for:  temperature >100.4   Complete by:  As directed    Diet - low sodium heart healthy   Complete by:  As directed    Discharge instructions   Complete by:  As directed    It has been a pleasure taking care of you! You were admitted with alcohol withdrawal, alcoholic pancreatitis (damage to your pancreas due to alcohol) and alcoholic hepatitis (damage to your liver due to alcohol).  We strongly recommend you quit drinking these could continue to damage your pancreas and your liver.  Right now, we are discharging you on a steroid (prednisolone) for alcoholic hepatitis.  Continue taking this as prescribed.  It is very important that you see your primary care doctor within a week of leaving the hospital to have your liver numbers checked again.  If your liver numbers remain elevated, they have to stop the steroid.  If your liver numbers improve, you need to continue taking the steroid for 1 month.  Also ask for referral to  gastroenterologist as soon as possible.  Once you are discharged, your primary care physician will handle any further medical issues. Please note that NO REFILLS for any discharge medications will be authorized once you are discharged, as it is imperative that you return to your primary care physician (or establish a relationship with a primary care physician if you do not have one) for your aftercare needs so that they can reassess your need for medications and monitor your lab values. Take care,   Increase activity slowly   Complete by:  As directed      Allergies as of 06/25/2018   No Known Allergies     Medication List    STOP taking these medications   aspirin 81 MG EC tablet     TAKE these medications   carvedilol 12.5 MG tablet Commonly known as:  COREG Take 1 tablet (12.5 mg total) 2 (two) times daily with a meal by mouth.   escitalopram 20 MG tablet Commonly known as:  LEXAPRO Take 20 mg by mouth at bedtime.   folic acid 1 MG tablet Commonly known as:  FOLVITE Take 1 tablet (1 mg total) daily by mouth.   lisinopril 10 MG tablet Commonly known as:  ZESTRIL Take 1 tablet (10 mg total) by mouth daily. Start taking on:  Jun 26, 2018   multivitamin with minerals Tabs tablet Take 1 tablet daily by mouth.   pantoprazole 40 MG tablet Commonly known as:  PROTONIX Take 1 tablet (40 mg total) by mouth daily. Start taking on:  Jun 26, 2018   prednisoLONE 5 MG Tabs tablet Take 8 tablets (40  mg total) by mouth daily with breakfast. Start taking on:  Jun 26, 2018   thiamine 100 MG tablet Take 1 tablet (100 mg total) daily by mouth.      Follow-up Information    Dough, Jaymes Graff, MD. Schedule an appointment as soon as possible for a visit in 1 week(s).   Specialty:  Family Medicine Contact information: [address] [place] [address] [phone]           Consultations:  Gastroenterology  Procedures/Studies:  2D Echo: None  Dg Chest Port 1  View  Result Date: 06/19/2018 CLINICAL DATA:  Nasogastric intubation EXAM: PORTABLE ABDOMEN - 1 VIEW; PORTABLE CHEST - 1 VIEW COMPARISON:  None. FINDINGS: The tip and side port of the nasogastric tube project over the stomach. There are mildly dilated loops of small bowel in the central abdomen. There is shallow lung inflation without focal airspace consolidation. No pulmonary edema. Normal cardiomediastinal contours. IMPRESSION: Tip and side port of NG tube project over the stomach. Electronically Signed   By: Ulyses Jarred M.D.   On: 06/19/2018 03:14   Dg Abd Portable 1v  Result Date: 06/19/2018 CLINICAL DATA:  Nasogastric intubation EXAM: PORTABLE ABDOMEN - 1 VIEW; PORTABLE CHEST - 1 VIEW COMPARISON:  None. FINDINGS: The tip and side port of the nasogastric tube project over the stomach. There are mildly dilated loops of small bowel in the central abdomen. There is shallow lung inflation without focal airspace consolidation. No pulmonary edema. Normal cardiomediastinal contours. IMPRESSION: Tip and side port of NG tube project over the stomach. Electronically Signed   By: Ulyses Jarred M.D.   On: 06/19/2018 03:14   Ir Abdomen US Limited  Result Date: 06/24/2018 CLINICAL DATA:  Evaluate for ascites and paracentesis. EXAM: LIMITED ABDOMEN ULTRASOUND FOR ASCITES TECHNIQUE: Limited ultrasound survey for ascites was performed in all four abdominal quadrants. COMPARISON:  Ultrasound 06/21/2018 FINDINGS: No ascites identified in the abdominal quadrants. IMPRESSION: No ascites. Electronically Signed   By: Markus Daft M.D.   On: 06/24/2018 11:50   US Liver Doppler  Result Date: 06/21/2018 CLINICAL DATA:  58 year old male with a history of elevated LFTs EXAM: DUPLEX ULTRASOUND OF LIVER TECHNIQUE: Color and duplex Doppler ultrasound was performed to evaluate the hepatic in-flow and out-flow vessels. COMPARISON:  None. FINDINGS: Portal Vein Velocities Main:  23 cm/sec Right:  18 cm/sec Left:  15 cm/sec Hepatic  Vein Velocities Right:  19 cm/sec Middle:  55 cm/sec Left:  43 cm/sec Hepatic Artery Velocity:  29 cm/sec Splenic Vein Velocity:  27 cm/sec Varices: Absent Ascites: Absent Spleen volume measures 291 cc IMPRESSION: Unremarkable directed duplex of the hepatic vasculature. Electronically Signed   By: Corrie Mckusick D.O.   On: 06/21/2018 15:57   US Abdomen Limited Ruq  Result Date: 06/21/2018 CLINICAL DATA:  Elevated liver function tests. EXAM: ULTRASOUND ABDOMEN LIMITED RIGHT UPPER QUADRANT COMPARISON:  None. FINDINGS: Gallbladder: No gallstones or wall thickening visualized. No sonographic Murphy sign noted by sonographer. Common bile duct: Diameter: 4 mm, normal Liver: No focal lesion identified. Increased echogenicity of the liver parenchyma. Portal vein is patent on color Doppler imaging with normal direction of blood flow towards the liver. IMPRESSION: Increased echogenicity of the liver parenchyma suggesting hepatic steatosis. Otherwise, normal exam. Electronically Signed   By: Lorriane Shire M.D.   On: 06/21/2018 15:28      Subjective: No major events overnight of this morning.  No complaint this morning.  Denies nausea, vomiting or abdominal pain.  Feels ready  to go home.  Extensive discussion about alcohol cessation and follow-up with PCP and referral to gastroenterology.   Discharge Exam: Vitals:   06/25/18 0552 06/25/18 1004  BP: (!) 138/91 (!) 151/96  Pulse: 65 60  Resp: 20 18  Temp: 98.3 F (36.8 C) 98.6 F (37 C)  SpO2: 98% 100%    GENERAL: No acute distress.  Appears well.  HEENT: MMM.  Vision and hearing grossly intact.  NECK: Supple.  No JVD.  LUNGS:  No IWOB. Good air movement bilaterally. HEART:  RRR. Heart sounds normal.  ABD: Bowel sounds present. Soft. Non tender.  MSK/EXT:  Moves all extremities. No apparent deformity. No edema bilaterally SKIN: no apparent skin lesion or wound NEURO: Awake, alert and oriented appropriately.  No gross deficit.  PSYCH: Calm. Normal  affect.     The results of significant diagnostics from this hospitalization (including imaging, microbiology, ancillary and laboratory) are listed below for reference.     Microbiology: Recent Results (from the past 240 hour(s))  MRSA PCR Screening     Status: None   Collection Time: 06/18/18 11:58 PM  Result Value Ref Range Status   MRSA by PCR NEGATIVE NEGATIVE Final    Comment:        The GeneXpert MRSA Assay (FDA approved for NASAL specimens only), is one component of a comprehensive MRSA colonization surveillance program. It is not intended to diagnose MRSA infection nor to guide or monitor treatment for MRSA infections. Performed at [facility], [place] [address] [place], [place] [address]   SARS Coronavirus 2 [facility] order, Performed in [facility] hospital lab)     Status: None   Collection Time: 06/19/18 12:47 AM  Result Value Ref Range Status   SARS Coronavirus 2 NEGATIVE NEGATIVE Final    Comment: (NOTE) If result is NEGATIVE SARS-CoV-2 target nucleic acids are NOT DETECTED. The SARS-CoV-2 RNA is generally detectable in upper and lower  respiratory specimens during the acute phase of infection. The lowest  concentration of SARS-CoV-2 viral copies this assay can detect is 250  copies / mL. A negative result does not preclude SARS-CoV-2 infection  and should not be used as the sole basis for treatment or other  patient management decisions.  A negative result may occur with  improper specimen collection / handling, submission of specimen other  than nasopharyngeal swab, presence of viral mutation(s) within the  areas targeted by this assay, and inadequate number of viral copies  (<250 copies / mL). A negative result must be combined with clinical  observations, patient history, and epidemiological information. If result is POSITIVE SARS-CoV-2 target nucleic acids are DETECTED. The SARS-CoV-2 RNA is generally detectable in upper and lower  respiratory  specimens dur ing the acute phase of infection.  Positive  results are indicative of active infection with SARS-CoV-2.  Clinical  correlation with patient history and other diagnostic information is  necessary to determine patient infection status.  Positive results do  not rule out bacterial infection or co-infection with other viruses. If result is PRESUMPTIVE POSTIVE SARS-CoV-2 nucleic acids MAY BE PRESENT.   A presumptive positive result was obtained on the submitted specimen  and confirmed on repeat testing.  While 2019 novel coronavirus  (SARS-CoV-2) nucleic acids may be present in the submitted sample  additional confirmatory testing may be necessary for epidemiological  and / or clinical management purposes  to differentiate between  SARS-CoV-2 and other Sarbecovirus currently known to infect humans.  If clinically indicated additional  testing with an alternate test  methodology [phone]) is advised. The SARS-CoV-2 RNA is generally  detectable in upper and lower respiratory sp ecimens during the acute  phase of infection. The expected result is Negative. Fact Sheet for Patients:  StrictlyIdeas.no Fact Sheet for Healthcare Providers: BankingDealers.co.za This test is not yet approved or cleared by the [place] FDA and has been authorized for detection and/or diagnosis of SARS-CoV-2 by FDA under an Emergency Use Authorization (EUA).  This EUA will remain in effect (meaning this test can be used) for the duration of the COVID-19 declaration under Section 564(b)(1) of the Act, 21 U.S.C. section 360bbb-3(b)(1), unless the authorization is terminated or revoked sooner. Performed at [facility], [place] [address] [place], [place] [address]   Culture, blood (routine x 2)     Status: None   Collection Time: 06/19/18  7:10 AM  Result Value Ref Range Status   Specimen Description BLOOD LEFT HAND  Final   Special Requests  AEROBIC BOTTLE ONLY Blood Culture adequate volume  Final   Culture   Final    NO GROWTH 5 DAYS Performed at [facility], [place] [address], [place], [place] [address]    Report Status 06/24/2018 FINAL  Final  Culture, blood (routine x 2)     Status: None   Collection Time: 06/19/18  7:20 AM  Result Value Ref Range Status   Specimen Description BLOOD RIGHT HAND  Final   Special Requests AEROBIC BOTTLE ONLY Blood Culture adequate volume  Final   Culture   Final    NO GROWTH 5 DAYS Performed at [facility], [place] [address] [place], [place] [address]    Report Status 06/24/2018 FINAL  Final     Labs: BNP (last 3 results) No results for input(s): BNP in the last 8760 hours. Basic Metabolic Panel: Recent Labs  Lab 06/19/18 0159  06/21/18 0354 06/22/18 0422 06/23/18 0338 06/24/18 0307 06/25/18 0333  NA 135   < > 137 133* 133* 133* 135  K 4.6   < > 3.6 3.6 2.9* 3.8 3.2*  CL 100   < > 100 93* 94* 98 99  CO2 18*   < > '23 22 27 25 25  ' GLUCOSE 98   < > 130* 108* 122* 192* 140*  BUN 19   < > '6 10 12 8 7  ' CREATININE 1.20   < > 0.77 0.85 0.72 0.75 0.76  CALCIUM 6.8*   < > 8.4* 8.4* 8.1* 7.9* 8.2*  MG 1.8  --   --   --  1.6* 1.9 1.7  PHOS 3.0  --   --   --  2.0* 1.9* 1.8*   < > = values in this interval not displayed.   Liver Function Tests: Recent Labs  Lab 06/21/18 0354 06/22/18 0422 06/22/18 1225 06/23/18 0338 06/24/18 0307 06/25/18 0333  AST 241* 241*  --  205* 201* 216*  ALT 135* 153*  --  145* 158* 174*  ALKPHOS 192* 282*  --  273* 359* 406*  BILITOT 9.5* 11.4* 10.6* 10.6* 9.4* 8.2*  PROT 5.5* 6.1*  --  5.1* 5.1* 5.0*  ALBUMIN 2.7* 2.9*  --  2.5* 2.4* 2.5*   Recent Labs  Lab 06/20/18 0301 06/21/18 0354 06/22/18 0422  LIPASE 53* 31 32   No results for input(s): AMMONIA in the last 168 hours. CBC: Recent Labs  Lab 06/19/18 0028 06/19/18 0159 06/21/18 0916 06/23/18 0338 06/24/18 0307  WBC  --  9.2 5.9 5.7 5.5  NEUTROABS  --  8.2* 4.0  --   --     HGB 12.6* 12.8* 11.5* 11.8* 11.7*  HCT 37.0* 38.3* 33.9* 34.9* 34.3*  MCV  --  89.5 87.6 88.1 87.9  PLT  --  48* 130* 203 240   Cardiac Enzymes: Recent Labs  Lab 06/22/18 0442  CKTOTAL 111   BNP: Invalid input(s): POCBNP CBG: Recent Labs  Lab 06/19/18 1941 06/19/18 2358 06/20/18 0750 06/20/18 1136 06/20/18 1522  GLUCAP 166* 119* 168* 175* 167*   D-Dimer No results for input(s): DDIMER in the last 72 hours. Hgb A1c No results for input(s): HGBA1C in the last 72 hours. Lipid Profile No results for input(s): CHOL, HDL, LDLCALC, TRIG, CHOLHDL, LDLDIRECT in the last 72 hours. Thyroid function studies No results for input(s): TSH, T4TOTAL, T3FREE, THYROIDAB in the last 72 hours.  Invalid input(s): FREET3 Anemia work up No results for input(s): VITAMINB12, FOLATE, FERRITIN, TIBC, IRON, RETICCTPCT in the last 72 hours. Urinalysis    Component Value Date/Time   COLORURINE AMBER (A) 06/19/2018 0255   APPEARANCEUR HAZY (A) 06/19/2018 0255   LABSPEC >1.046 (H) 06/19/2018 0255   PHURINE 6.0 06/19/2018 0255   GLUCOSEU NEGATIVE 06/19/2018 0255   HGBUR MODERATE (A) 06/19/2018 0255   BILIRUBINUR SMALL (A) 06/19/2018 0255   KETONESUR 20 (A) 06/19/2018 0255   PROTEINUR 100 (A) 06/19/2018 0255   NITRITE NEGATIVE 06/19/2018 0255   LEUKOCYTESUR TRACE (A) 06/19/2018 0255   Sepsis Labs Invalid input(s): PROCALCITONIN,  WBC,  LACTICIDVEN   Time coordinating discharge: 40 minutes  SIGNED:  Mercy Riding, MD  [facility] 06/25/2018, 10:48 AM Pager [phone]  If 7PM-7AM, please contact night-coverage [email or web address] Password [email or web address]

## 2019-09-12 ENCOUNTER — Encounter: Payer: Self-pay | Admitting: Neurology

## 2019-09-12 ENCOUNTER — Other Ambulatory Visit: Payer: Self-pay

## 2019-09-12 ENCOUNTER — Ambulatory Visit: Payer: BC Managed Care – PPO | Admitting: Neurology

## 2019-09-12 DIAGNOSIS — G930 Cerebral cysts: Secondary | ICD-10-CM

## 2019-09-12 DIAGNOSIS — Q046 Congenital cerebral cysts: Secondary | ICD-10-CM

## 2019-09-12 HISTORY — DX: Congenital cerebral cysts: Q04.6

## 2019-09-12 NOTE — Progress Notes (Signed)
Reason for visit: Abnormal MRI brain  Referring physician: Dr. Wyatt Portela is a 59 y.o. male  History of present illness:  Christopher Rogers is a 59 year old right-handed white male with a history of diabetes, prior alcohol abuse and polysubstance abuse.  The patient was diagnosed with diabetes in March 2020.  The patient has had a 2-year history of gradual atrophy and weakness of the hands, he has significant numbness and hypersensitivity of the fourth and fifth fingers of the hands bilaterally, right greater than left.  He had an ulnar nerve transposition surgery on the right in September 2020 that offered no benefit.  He complains of neck pain with radiation into the shoulder area without any radicular pain down either arm.  He may have some occipital headaches on occasion.  The patient underwent MRI of the cervical spine which revealed a 9 x 4 x 17 mm cyst in the mid pontine area.  The patient brings a disc for my review, no evidence of ischemic changes of the brain are seen elsewhere.  The patient denies any double vision, slurred speech, altered swallowing, or vertigo.  The patient denies any issues controlling the bowels or the bladder.  He does have some mild gait instability, he reports no recent falls.  He has no significant numbness in the feet per se, only in the hands.  He is sent to this office for an evaluation.  Prior EMG and nerve conduction study reveals evidence of bilateral carpal tunnel syndrome and bilateral ulnar nerve dysfunction, right greater than left.  Report of the MRI of the cervical spine Jun 26, 2019 reveals:  Impression:  1.  Large ovoid T2 hyperintense focus following CSF signal within the central aspect of the pons.  There is suggestion of mild T2 hyperintensity standing into bilateral cerebellar peduncle also favoring old infarct versus less likely neural epithelial cyst.  Correlate with prior history.  2.  Moderate C6-7 spinal stenosis with moderate  flattening of the cord due to severe bulging disc-osteophyte complex.  Severe left and moderate-severe right neuroforaminal narrowing impinging upon exiting C7 nerve roots, more pronounced on the left.  3.  Moderate C7-T1 central zone narrowing with moderate indentation upon midline cord due to moderate central disc extrusion superimposed upon severe bulging disc-osteophyte complex.  Severe left and moderate-severe right neural foraminal narrowing, impinging upon exiting C8 nerve roots, more pronounced on the left.  4.  Moderate-severe right and mild left C5-6 neuroforaminal narrowing, impinging upon exiting right C6 nerve roots.  Mild spinal stenosis with mild deformity of the cords asymmetric to the left.  5.  Severe right C4-5 neuroforaminal narrowing, impinging upon exiting right C5 nerve roots.  6.  Severe left and moderate right C3-4 neuroforaminal narrowing, impinging upon exiting left greater than right C4 nerve roots.  Mild spinal stenosis without contact or deformity of the cord.  7.  Mild left T1-2 neuroforaminal narrowing.  8.  Mild levoconvex cervical scoliosis, apex centered at C5.  Mild reversal of normal cervical lordosis, apex centered at C5.    Past Medical History:  Diagnosis Date  . ADD (attention deficit hyperactivity disorder, inattentive type)   . Anxiety   . Arthritis   . Chest pain    a. 12/2016 Eval @ [facility]- reportedly neg MV w/ hypertensive response to exercise.  . Depression   . Essential hypertension    a. Dx 12/2016.  Marland Kitchen Insomnia   . Morbid obesity ([place])     Past  Surgical History:  Procedure Laterality Date  . BICEPS TENDON REPAIR Left   . COLONOSCOPY    . EYE SURGERY    . LUMBAR LAMINECTOMY/DECOMPRESSION MICRODISCECTOMY Right 04/20/2013   Procedure: L3-L4 DECOMPRESSION DISCECTOMY (1 LEVEL);  Surgeon: Melina Schools, MD;  Location: [place];  Service: Orthopedics;  Laterality: Right;  . PROSTATECTOMY N/A 12/07/2014   Procedure: OPEN SUPRAPUBIC  PROSTATECTOMY ;  Surgeon: Carolan Clines, MD;  Location: [facility];  Service: Urology;  Laterality: N/A;  . SHOULDER ARTHROSCOPY Left    clavical repair x2  . VARICOSE VEIN SURGERY Bilateral   . WISDOM TOOTH EXTRACTION      Family History  Problem Relation Age of Onset  . Other Mother        Alive & Well  . Alzheimer's disease Father     Social history:  reports that he has never smoked. His smokeless tobacco use includes chew. He reports current alcohol use. He reports that he does not use drugs.  Medications:  Prior to Admission medications   Medication Sig Start Date End Date Taking? Authorizing Provider  atorvastatin (LIPITOR) 80 MG tablet Take 80 mg by mouth daily.   Yes [provider]  carvedilol (COREG) 12.5 MG tablet Take 1 tablet (12.5 mg total) 2 (two) times daily with a meal by mouth. 01/04/17  Yes Sheikh, Omair Latif, DO  escitalopram (LEXAPRO) 20 MG tablet Take 20 mg by mouth at bedtime.    Yes [provider]  Insulin Glargine (TOUJEO SOLOSTAR [place]) Inject into the skin.   Yes [provider]  Multiple Vitamin (MULTIVITAMIN WITH MINERALS) TABS tablet Take 1 tablet daily by mouth. 01/05/17  Yes Sheikh, Omair Latif, DO     No Known Allergies  ROS:  Out of a complete 14 system review of symptoms, the patient complains only of the following symptoms, and all other reviewed systems are negative.  Hand numbness, sensitivity Neck pain Walking difficulty  Blood pressure 110/78, pulse 74, height 5\' 10"  (1.778 m), weight 193 lb (87.5 kg).  Physical Exam  General: The patient is alert and cooperative at the time of the examination.  Eyes: Pupils are equal, round, and reactive to light. Discs are flat bilaterally.  Neck: The neck is supple, no carotid bruits are noted.  Respiratory: The respiratory examination is clear.  Cardiovascular: The cardiovascular examination reveals a regular rate and rhythm, no obvious murmurs or rubs are  noted.  Skin: Extremities are without significant edema.  Atrophy of the first dorsal interossei seen bilaterally.  Neurologic Exam  Mental status: The patient is alert and oriented x 3 at the time of the examination. The patient has apparent normal recent and remote memory, with an apparently normal attention span and concentration ability.  Cranial nerves: Facial symmetry is present. There is good sensation of the face to pinprick and soft touch bilaterally. The strength of the facial muscles and the muscles to head turning and shoulder shrug are normal bilaterally. Speech is well enunciated, no aphasia or dysarthria is noted. Extraocular movements are full. Visual fields are full. The tongue is midline, and the patient has symmetric elevation of the soft palate. No obvious hearing deficits are noted.  Motor: The motor testing reveals 5 over 5 strength of all 4 extremities, with exception of significant weakness of intrinsic muscles of the hands bilaterally. Good symmetric motor tone is noted throughout.  Sensory: Sensory testing is intact to pinprick, soft touch, vibration sensation, and position sense on all 4 extremities, with exception  of decreased pinprick sensation and hypersensitivity to pinprick on the ulnar aspect of the hands bilaterally.  There is slight decrease of vibration sensation in the left foot as compared to the right. No evidence of extinction is noted.  Coordination: Cerebellar testing reveals good finger-nose-finger and heel-to-shin bilaterally.  Gait and station: Gait is normal. Tandem gait is slightly unsteady. Romberg is negative. No drift is seen.  Reflexes: Deep tendon reflexes are symmetric and normal bilaterally.  Ankle jerk reflexes are well-maintained bilaterally.  Toes are downgoing bilaterally.   Assessment/Plan:  1.  Abnormal MRI brain, cystic structure, neuroepithelial cyst  2.  Neuropathy, bilateral median and ulnar neuropathies  3.  Cervical  spondylosis, multilevel nerve root impingement  The patient appears to have a midline cystic structure in the pontine area, no prior history of stroke and no evidence of central neurologic deficits on clinical examination.  This would suggest a long duration cystic structure that is asymptomatic.  I would see no contraindication for surgery in this patient due to this finding.  The patient will follow up through this office if needed.  Jill Alexanders MD 09/12/2019 10:30 AM  [facility] [address] [place], [place] [address]  Phone [phone] Fax [phone]

## 2019-09-22 ENCOUNTER — Ambulatory Visit: Payer: Self-pay | Admitting: Orthopedic Surgery

## 2019-10-25 ENCOUNTER — Ambulatory Visit: Admit: 2019-10-25 | Payer: BLUE CROSS/BLUE SHIELD | Admitting: Orthopedic Surgery

## 2019-10-25 SURGERY — ANTERIOR CERVICAL DECOMPRESSION/DISCECTOMY FUSION 4 LEVELS
Anesthesia: General

## 2020-05-16 DIAGNOSIS — M1990 Unspecified osteoarthritis, unspecified site: Secondary | ICD-10-CM | POA: Diagnosis not present

## 2020-05-16 DIAGNOSIS — I959 Hypotension, unspecified: Secondary | ICD-10-CM | POA: Diagnosis not present

## 2020-05-16 DIAGNOSIS — R079 Chest pain, unspecified: Secondary | ICD-10-CM | POA: Diagnosis not present

## 2020-05-16 DIAGNOSIS — R739 Hyperglycemia, unspecified: Secondary | ICD-10-CM | POA: Diagnosis not present

## 2020-05-16 DIAGNOSIS — E861 Hypovolemia: Secondary | ICD-10-CM | POA: Diagnosis not present

## 2020-05-16 DIAGNOSIS — R000 Tachycardia, unspecified: Secondary | ICD-10-CM | POA: Diagnosis not present

## 2020-05-16 DIAGNOSIS — Z9111 Patient's noncompliance with dietary regimen: Secondary | ICD-10-CM | POA: Diagnosis not present

## 2020-05-16 DIAGNOSIS — J9811 Atelectasis: Secondary | ICD-10-CM | POA: Diagnosis not present

## 2020-05-16 DIAGNOSIS — R55 Syncope and collapse: Secondary | ICD-10-CM | POA: Diagnosis not present

## 2020-05-16 DIAGNOSIS — R42 Dizziness and giddiness: Secondary | ICD-10-CM | POA: Diagnosis not present

## 2020-05-16 DIAGNOSIS — Z23 Encounter for immunization: Secondary | ICD-10-CM | POA: Diagnosis not present

## 2020-05-16 DIAGNOSIS — F32A Depression, unspecified: Secondary | ICD-10-CM | POA: Diagnosis not present

## 2020-05-16 DIAGNOSIS — E1165 Type 2 diabetes mellitus with hyperglycemia: Secondary | ICD-10-CM | POA: Diagnosis not present

## 2020-05-16 DIAGNOSIS — I10 Essential (primary) hypertension: Secondary | ICD-10-CM | POA: Diagnosis not present

## 2020-05-16 DIAGNOSIS — E860 Dehydration: Secondary | ICD-10-CM | POA: Diagnosis not present

## 2020-05-16 DIAGNOSIS — R0789 Other chest pain: Secondary | ICD-10-CM | POA: Diagnosis not present

## 2020-05-16 DIAGNOSIS — N400 Enlarged prostate without lower urinary tract symptoms: Secondary | ICD-10-CM | POA: Diagnosis not present

## 2020-05-16 DIAGNOSIS — Z79899 Other long term (current) drug therapy: Secondary | ICD-10-CM | POA: Diagnosis not present

## 2020-05-16 DIAGNOSIS — Z794 Long term (current) use of insulin: Secondary | ICD-10-CM | POA: Diagnosis not present

## 2020-05-22 DIAGNOSIS — F1010 Alcohol abuse, uncomplicated: Secondary | ICD-10-CM | POA: Diagnosis not present

## 2020-05-22 DIAGNOSIS — I10 Essential (primary) hypertension: Secondary | ICD-10-CM | POA: Diagnosis not present

## 2020-05-22 DIAGNOSIS — E119 Type 2 diabetes mellitus without complications: Secondary | ICD-10-CM | POA: Diagnosis not present

## 2020-05-22 DIAGNOSIS — E7800 Pure hypercholesterolemia, unspecified: Secondary | ICD-10-CM | POA: Diagnosis not present

## 2020-05-22 DIAGNOSIS — Z794 Long term (current) use of insulin: Secondary | ICD-10-CM | POA: Diagnosis not present

## 2020-06-25 DIAGNOSIS — R0789 Other chest pain: Secondary | ICD-10-CM | POA: Diagnosis not present

## 2020-06-25 DIAGNOSIS — F419 Anxiety disorder, unspecified: Secondary | ICD-10-CM | POA: Diagnosis not present

## 2020-06-25 DIAGNOSIS — F900 Attention-deficit hyperactivity disorder, predominantly inattentive type: Secondary | ICD-10-CM | POA: Diagnosis not present

## 2020-06-25 DIAGNOSIS — Z20822 Contact with and (suspected) exposure to covid-19: Secondary | ICD-10-CM | POA: Diagnosis not present

## 2020-06-25 DIAGNOSIS — R778 Other specified abnormalities of plasma proteins: Secondary | ICD-10-CM | POA: Diagnosis not present

## 2020-06-25 DIAGNOSIS — I200 Unstable angina: Secondary | ICD-10-CM | POA: Diagnosis present

## 2020-06-25 DIAGNOSIS — Z7982 Long term (current) use of aspirin: Secondary | ICD-10-CM | POA: Diagnosis not present

## 2020-06-25 DIAGNOSIS — Z820 Family history of epilepsy and other diseases of the nervous system: Secondary | ICD-10-CM | POA: Diagnosis not present

## 2020-06-25 DIAGNOSIS — Z79899 Other long term (current) drug therapy: Secondary | ICD-10-CM | POA: Diagnosis not present

## 2020-06-25 DIAGNOSIS — I249 Acute ischemic heart disease, unspecified: Secondary | ICD-10-CM | POA: Diagnosis not present

## 2020-06-25 DIAGNOSIS — M1990 Unspecified osteoarthritis, unspecified site: Secondary | ICD-10-CM | POA: Diagnosis not present

## 2020-06-25 DIAGNOSIS — R739 Hyperglycemia, unspecified: Secondary | ICD-10-CM | POA: Diagnosis not present

## 2020-06-25 DIAGNOSIS — R072 Precordial pain: Secondary | ICD-10-CM | POA: Diagnosis not present

## 2020-06-25 DIAGNOSIS — R7401 Elevation of levels of liver transaminase levels: Secondary | ICD-10-CM | POA: Diagnosis not present

## 2020-06-25 DIAGNOSIS — R7989 Other specified abnormal findings of blood chemistry: Secondary | ICD-10-CM | POA: Diagnosis not present

## 2020-06-25 DIAGNOSIS — R4020 Unspecified coma: Secondary | ICD-10-CM | POA: Diagnosis not present

## 2020-06-25 DIAGNOSIS — Z87891 Personal history of nicotine dependence: Secondary | ICD-10-CM | POA: Diagnosis not present

## 2020-06-25 DIAGNOSIS — D496 Neoplasm of unspecified behavior of brain: Secondary | ICD-10-CM | POA: Diagnosis not present

## 2020-06-25 DIAGNOSIS — E1165 Type 2 diabetes mellitus with hyperglycemia: Secondary | ICD-10-CM | POA: Diagnosis not present

## 2020-06-25 DIAGNOSIS — Z9079 Acquired absence of other genital organ(s): Secondary | ICD-10-CM | POA: Diagnosis not present

## 2020-06-25 DIAGNOSIS — Z794 Long term (current) use of insulin: Secondary | ICD-10-CM | POA: Diagnosis not present

## 2020-06-25 DIAGNOSIS — I214 Non-ST elevation (NSTEMI) myocardial infarction: Secondary | ICD-10-CM | POA: Diagnosis not present

## 2020-06-25 DIAGNOSIS — I10 Essential (primary) hypertension: Secondary | ICD-10-CM | POA: Diagnosis not present

## 2020-06-25 DIAGNOSIS — E785 Hyperlipidemia, unspecified: Secondary | ICD-10-CM | POA: Diagnosis not present

## 2020-06-25 DIAGNOSIS — N400 Enlarged prostate without lower urinary tract symptoms: Secondary | ICD-10-CM | POA: Diagnosis not present

## 2020-06-25 DIAGNOSIS — I4891 Unspecified atrial fibrillation: Secondary | ICD-10-CM | POA: Diagnosis not present

## 2020-06-25 DIAGNOSIS — R002 Palpitations: Secondary | ICD-10-CM | POA: Diagnosis not present

## 2020-06-25 DIAGNOSIS — R55 Syncope and collapse: Secondary | ICD-10-CM | POA: Diagnosis not present

## 2020-06-25 DIAGNOSIS — R001 Bradycardia, unspecified: Secondary | ICD-10-CM | POA: Diagnosis not present

## 2020-06-25 DIAGNOSIS — E119 Type 2 diabetes mellitus without complications: Secondary | ICD-10-CM | POA: Diagnosis not present

## 2020-06-25 DIAGNOSIS — F32A Depression, unspecified: Secondary | ICD-10-CM | POA: Diagnosis not present

## 2020-06-25 DIAGNOSIS — F1011 Alcohol abuse, in remission: Secondary | ICD-10-CM | POA: Diagnosis not present

## 2020-06-25 DIAGNOSIS — I2584 Coronary atherosclerosis due to calcified coronary lesion: Secondary | ICD-10-CM | POA: Diagnosis not present

## 2020-06-25 DIAGNOSIS — R079 Chest pain, unspecified: Secondary | ICD-10-CM | POA: Diagnosis not present

## 2020-06-25 DIAGNOSIS — I25110 Atherosclerotic heart disease of native coronary artery with unstable angina pectoris: Secondary | ICD-10-CM | POA: Diagnosis not present

## 2020-06-26 ENCOUNTER — Other Ambulatory Visit: Payer: Self-pay

## 2020-06-26 ENCOUNTER — Encounter (HOSPITAL_COMMUNITY): Payer: Self-pay | Admitting: Cardiovascular Disease

## 2020-06-26 ENCOUNTER — Encounter (HOSPITAL_COMMUNITY)
Admission: RE | Disposition: A | Discharge status: Home / Self Care | Payer: Self-pay | Source: Other Acute Inpatient Hospital | Attending: Cardiovascular Disease

## 2020-06-26 ENCOUNTER — Inpatient Hospital Stay (HOSPITAL_COMMUNITY)
Admission: RE | Admit: 2020-06-26 | Discharge: 2020-06-27 | DRG: 247 | Disposition: A | Discharge status: Home / Self Care | Payer: PPO | Source: Other Acute Inpatient Hospital | Attending: Cardiovascular Disease | Admitting: Cardiovascular Disease

## 2020-06-26 DIAGNOSIS — I10 Essential (primary) hypertension: Secondary | ICD-10-CM | POA: Diagnosis not present

## 2020-06-26 DIAGNOSIS — Z79899 Other long term (current) drug therapy: Secondary | ICD-10-CM | POA: Diagnosis not present

## 2020-06-26 DIAGNOSIS — Z20822 Contact with and (suspected) exposure to covid-19: Secondary | ICD-10-CM | POA: Diagnosis not present

## 2020-06-26 DIAGNOSIS — Z9079 Acquired absence of other genital organ(s): Secondary | ICD-10-CM | POA: Diagnosis not present

## 2020-06-26 DIAGNOSIS — F419 Anxiety disorder, unspecified: Secondary | ICD-10-CM | POA: Diagnosis not present

## 2020-06-26 DIAGNOSIS — I2584 Coronary atherosclerosis due to calcified coronary lesion: Secondary | ICD-10-CM | POA: Diagnosis not present

## 2020-06-26 DIAGNOSIS — R002 Palpitations: Secondary | ICD-10-CM | POA: Diagnosis not present

## 2020-06-26 DIAGNOSIS — R001 Bradycardia, unspecified: Secondary | ICD-10-CM | POA: Diagnosis not present

## 2020-06-26 DIAGNOSIS — Z794 Long term (current) use of insulin: Secondary | ICD-10-CM | POA: Diagnosis not present

## 2020-06-26 DIAGNOSIS — Z820 Family history of epilepsy and other diseases of the nervous system: Secondary | ICD-10-CM

## 2020-06-26 DIAGNOSIS — E119 Type 2 diabetes mellitus without complications: Secondary | ICD-10-CM | POA: Diagnosis present

## 2020-06-26 DIAGNOSIS — M1990 Unspecified osteoarthritis, unspecified site: Secondary | ICD-10-CM | POA: Diagnosis not present

## 2020-06-26 DIAGNOSIS — R7989 Other specified abnormal findings of blood chemistry: Secondary | ICD-10-CM | POA: Diagnosis present

## 2020-06-26 DIAGNOSIS — I25110 Atherosclerotic heart disease of native coronary artery with unstable angina pectoris: Secondary | ICD-10-CM | POA: Diagnosis not present

## 2020-06-26 DIAGNOSIS — R55 Syncope and collapse: Secondary | ICD-10-CM | POA: Diagnosis not present

## 2020-06-26 DIAGNOSIS — D496 Neoplasm of unspecified behavior of brain: Secondary | ICD-10-CM | POA: Diagnosis present

## 2020-06-26 DIAGNOSIS — I249 Acute ischemic heart disease, unspecified: Secondary | ICD-10-CM | POA: Diagnosis not present

## 2020-06-26 DIAGNOSIS — I200 Unstable angina: Secondary | ICD-10-CM | POA: Diagnosis present

## 2020-06-26 DIAGNOSIS — F32A Depression, unspecified: Secondary | ICD-10-CM | POA: Diagnosis present

## 2020-06-26 DIAGNOSIS — I2510 Atherosclerotic heart disease of native coronary artery without angina pectoris: Secondary | ICD-10-CM | POA: Diagnosis present

## 2020-06-26 DIAGNOSIS — Z95820 Peripheral vascular angioplasty status with implants and grafts: Secondary | ICD-10-CM

## 2020-06-26 DIAGNOSIS — F900 Attention-deficit hyperactivity disorder, predominantly inattentive type: Secondary | ICD-10-CM | POA: Diagnosis present

## 2020-06-26 DIAGNOSIS — I214 Non-ST elevation (NSTEMI) myocardial infarction: Secondary | ICD-10-CM | POA: Diagnosis not present

## 2020-06-26 DIAGNOSIS — Z955 Presence of coronary angioplasty implant and graft: Secondary | ICD-10-CM

## 2020-06-26 DIAGNOSIS — E785 Hyperlipidemia, unspecified: Secondary | ICD-10-CM | POA: Diagnosis present

## 2020-06-26 HISTORY — PX: CORONARY STENT INTERVENTION: CATH118234

## 2020-06-26 HISTORY — PX: LEFT HEART CATH AND CORONARY ANGIOGRAPHY: CATH118249

## 2020-06-26 LAB — CBC
HCT: 44.4 % (ref 39.0–52.0)
Hemoglobin: 15 g/dL (ref 13.0–17.0)
MCH: 29.2 pg (ref 26.0–34.0)
MCHC: 33.8 g/dL (ref 30.0–36.0)
MCV: 86.4 fL (ref 80.0–100.0)
Platelets: 156 10*3/uL (ref 150–400)
RBC: 5.14 MIL/uL (ref 4.22–5.81)
RDW: 13.5 % (ref 11.5–15.5)
WBC: 5.9 10*3/uL (ref 4.0–10.5)
nRBC: 0 % (ref 0.0–0.2)

## 2020-06-26 LAB — POCT ACTIVATED CLOTTING TIME
Activated Clotting Time: 249 seconds
Activated Clotting Time: 386 seconds
Activated Clotting Time: 470 seconds

## 2020-06-26 LAB — GLUCOSE, CAPILLARY: Glucose-Capillary: 152 mg/dL — ABNORMAL HIGH (ref 70–99)

## 2020-06-26 LAB — CREATININE, SERUM
Creatinine, Ser: 0.86 mg/dL (ref 0.61–1.24)
GFR, Estimated: >60 mL/min (ref >60)

## 2020-06-26 SURGERY — LEFT HEART CATH AND CORONARY ANGIOGRAPHY
Anesthesia: LOCAL

## 2020-06-26 MED ORDER — FENTANYL CITRATE (PF) 100 MCG/2ML IJ SOLN
INTRAMUSCULAR | Status: AC
  Filled 2020-06-26: qty 2

## 2020-06-26 MED ORDER — HEPARIN (PORCINE) IN NACL 1000-0.9 UT/500ML-% IV SOLN
INTRAVENOUS | Status: DC | PRN
  Administered 2020-06-26 (×2): 500 mL

## 2020-06-26 MED ORDER — HEPARIN SODIUM (PORCINE) 1000 UNIT/ML IJ SOLN
INTRAMUSCULAR | Status: AC
  Filled 2020-06-26: qty 1

## 2020-06-26 MED ORDER — LABETALOL HCL 5 MG/ML IV SOLN: 10.0000 mg | INTRAVENOUS | Status: AC | PRN

## 2020-06-26 MED ORDER — NITROGLYCERIN 1 MG/10 ML FOR IR/CATH LAB
INTRA_ARTERIAL | Status: AC
  Filled 2020-06-26: qty 10

## 2020-06-26 MED ORDER — SODIUM CHLORIDE 0.9 % IV SOLN
INTRAVENOUS | Status: AC | PRN
  Administered 2020-06-26: 200 mL/h via INTRAVENOUS

## 2020-06-26 MED ORDER — VERAPAMIL HCL 2.5 MG/ML IV SOLN
INTRAVENOUS | Status: AC
  Filled 2020-06-26: qty 2

## 2020-06-26 MED ORDER — ENOXAPARIN SODIUM 40 MG/0.4ML IJ SOSY
40.0000 mg | PREFILLED_SYRINGE | INTRAMUSCULAR | Status: DC
  Administered 2020-06-27: 40 mg via SUBCUTANEOUS
  Filled 2020-06-26: qty 0.4

## 2020-06-26 MED ORDER — MIDAZOLAM HCL 2 MG/2ML IJ SOLN
INTRAMUSCULAR | Status: DC | PRN
  Administered 2020-06-26 (×2): 1 mg via INTRAVENOUS

## 2020-06-26 MED ORDER — SODIUM CHLORIDE 0.9 % IV SOLN: 250.0000 mL | INTRAVENOUS | Status: DC | PRN

## 2020-06-26 MED ORDER — MIDAZOLAM HCL 2 MG/2ML IJ SOLN
INTRAMUSCULAR | Status: AC
  Filled 2020-06-26: qty 2

## 2020-06-26 MED ORDER — ASPIRIN 81 MG PO CHEW
81.0000 mg | CHEWABLE_TABLET | Freq: Every day | ORAL | Status: DC
  Administered 2020-06-26 – 2020-06-27 (×2): 81 mg via ORAL
  Filled 2020-06-26 (×2): qty 1

## 2020-06-26 MED ORDER — FENTANYL CITRATE (PF) 100 MCG/2ML IJ SOLN
INTRAMUSCULAR | Status: DC | PRN
  Administered 2020-06-26 (×2): 25 ug via INTRAVENOUS

## 2020-06-26 MED ORDER — ENSURE ENLIVE PO LIQD: 237.0000 mL | Freq: Two times a day (BID) | ORAL | Status: DC

## 2020-06-26 MED ORDER — VERAPAMIL HCL 2.5 MG/ML IV SOLN
INTRAVENOUS | Status: DC | PRN
  Administered 2020-06-26: 10 mL via INTRA_ARTERIAL

## 2020-06-26 MED ORDER — FENTANYL CITRATE (PF) 100 MCG/2ML IJ SOLN
INTRAMUSCULAR | Status: DC | PRN
  Administered 2020-06-26: 25 ug via INTRAVENOUS

## 2020-06-26 MED ORDER — TICAGRELOR 90 MG PO TABS
ORAL_TABLET | ORAL | Status: DC | PRN
  Administered 2020-06-26: 180 mg via ORAL

## 2020-06-26 MED ORDER — MIDAZOLAM HCL 2 MG/2ML IJ SOLN
INTRAMUSCULAR | Status: DC | PRN
  Administered 2020-06-26: 1 mg via INTRAVENOUS

## 2020-06-26 MED ORDER — IOHEXOL 350 MG/ML SOLN
INTRAVENOUS | Status: DC | PRN
  Administered 2020-06-26: 195 mL

## 2020-06-26 MED ORDER — DIAZEPAM 5 MG PO TABS
5.0000 mg | ORAL_TABLET | Freq: Four times a day (QID) | ORAL | Status: DC | PRN
Start: 2020-06-26 — End: 2020-06-27
  Administered 2020-06-26: 5 mg via ORAL
  Filled 2020-06-26: qty 1

## 2020-06-26 MED ORDER — ONDANSETRON HCL 4 MG/2ML IJ SOLN: 4.0000 mg | Freq: Four times a day (QID) | INTRAMUSCULAR | Status: DC | PRN

## 2020-06-26 MED ORDER — SODIUM CHLORIDE 0.9% FLUSH: 3.0000 mL | INTRAVENOUS | Status: DC | PRN

## 2020-06-26 MED ORDER — SODIUM CHLORIDE 0.9 % IV SOLN: INTRAVENOUS | Status: AC

## 2020-06-26 MED ORDER — HEPARIN SODIUM (PORCINE) 1000 UNIT/ML IJ SOLN
INTRAMUSCULAR | Status: DC | PRN
  Administered 2020-06-26: 3000 [IU] via INTRAVENOUS
  Administered 2020-06-26: 5000 [IU] via INTRAVENOUS
  Administered 2020-06-26: 4000 [IU] via INTRAVENOUS

## 2020-06-26 MED ORDER — NITROGLYCERIN 1 MG/10 ML FOR IR/CATH LAB
INTRA_ARTERIAL | Status: DC | PRN
  Administered 2020-06-26: 100 ug via INTRACORONARY
  Administered 2020-06-26: 50 ug via INTRACORONARY

## 2020-06-26 MED ORDER — HYDRALAZINE HCL 20 MG/ML IJ SOLN: 10.0000 mg | INTRAMUSCULAR | Status: AC | PRN

## 2020-06-26 MED ORDER — HEPARIN (PORCINE) IN NACL 1000-0.9 UT/500ML-% IV SOLN
INTRAVENOUS | Status: AC
  Filled 2020-06-26: qty 1000

## 2020-06-26 MED ORDER — LIDOCAINE HCL (PF) 1 % IJ SOLN
INTRAMUSCULAR | Status: DC | PRN
Start: 2020-06-26 — End: 2020-06-26
  Administered 2020-06-26: 2 mL via INTRADERMAL

## 2020-06-26 MED ORDER — ACETAMINOPHEN 325 MG PO TABS
650.0000 mg | ORAL_TABLET | ORAL | Status: DC | PRN
  Administered 2020-06-26: 650 mg via ORAL
  Filled 2020-06-26: qty 2

## 2020-06-26 MED ORDER — TICAGRELOR 90 MG PO TABS
ORAL_TABLET | ORAL | Status: AC
  Filled 2020-06-26: qty 2

## 2020-06-26 MED ORDER — ATORVASTATIN CALCIUM 80 MG PO TABS
80.0000 mg | ORAL_TABLET | Freq: Every day | ORAL | Status: DC
  Administered 2020-06-27: 80 mg via ORAL
  Filled 2020-06-26: qty 1

## 2020-06-26 MED ORDER — LIDOCAINE HCL (PF) 1 % IJ SOLN
INTRAMUSCULAR | Status: AC
  Filled 2020-06-26: qty 30

## 2020-06-26 MED ORDER — SODIUM CHLORIDE 0.9% FLUSH
3.0000 mL | Freq: Two times a day (BID) | INTRAVENOUS | Status: DC
  Administered 2020-06-26: 3 mL via INTRAVENOUS

## 2020-06-26 MED ORDER — TICAGRELOR 90 MG PO TABS
90.0000 mg | ORAL_TABLET | Freq: Two times a day (BID) | ORAL | Status: DC
  Administered 2020-06-26 – 2020-06-27 (×2): 90 mg via ORAL
  Filled 2020-06-26 (×2): qty 1

## 2020-06-26 SURGICAL SUPPLY — 23 items
BALLN  ~~LOC~~ SAPPHIRE 4.5X12 (BALLOONS) ×2
BALLN SAPPHIRE 2.5X12 (BALLOONS) ×2
BALLN WOLVERINE 2.50X15 (BALLOONS) ×2
BALLN WOLVERINE 3.00X15 (BALLOONS) ×2
BALLN ~~LOC~~ SAPPHIRE 4.5X12 (BALLOONS) ×1
BALLOON SAPPHIRE 2.5X12 (BALLOONS) IMPLANT
BALLOON WOLVERINE 2.50X15 (BALLOONS) IMPLANT
BALLOON WOLVERINE 3.00X15 (BALLOONS) IMPLANT
BALLOON ~~LOC~~ SAPPHIRE 4.5X12 (BALLOONS) IMPLANT
CATH OPTITORQUE TIG 4.0 5F (CATHETERS) ×1 IMPLANT
CATH VISTA GUIDE 6FR XBLAD3.5 (CATHETERS) ×1 IMPLANT
DEVICE RAD COMP TR BAND LRG (VASCULAR PRODUCTS) ×1 IMPLANT
GUIDEWIRE INQWIRE 1.5J.035X260 (WIRE) IMPLANT
INQWIRE 1.5J .035X260CM (WIRE) ×2
KIT ENCORE 26 ADVANTAGE (KITS) ×1 IMPLANT
KIT ESSENTIALS PG (KITS) ×1 IMPLANT
KIT HEART LEFT (KITS) ×2 IMPLANT
PACK CARDIAC CATHETERIZATION (CUSTOM PROCEDURE TRAY) ×2 IMPLANT
STENT RESOLUTE ONYX 4.0X18 (Permanent Stent) ×1 IMPLANT
TRANSDUCER W/STOPCOCK (MISCELLANEOUS) ×2 IMPLANT
TUBING CIL FLEX 10 FLL-RA (TUBING) ×2 IMPLANT
WIRE ASAHI PROWATER 180CM (WIRE) ×1 IMPLANT
WIRE COUGAR XT STRL 190CM (WIRE) ×1 IMPLANT
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
## 2020-06-26 NOTE — Interval H&P Note (Signed)
Cath Lab Visit (complete for each Cath Lab visit)  Clinical Evaluation Leading to the Procedure:   ACS: No.  Non-ACS:    Anginal Classification: CCS III  Anti-ischemic medical therapy: Minimal Therapy (1 class of medications)  Non-Invasive Test Results: No non-invasive testing performed  Prior CABG: No previous CABG      History and Physical Interval Note:  06/26/2020 1:12 PM  Christopher Rogers  has presented today for surgery, with the diagnosis of unstable angina.  The various methods of treatment have been discussed with the patient and family. After consideration of risks, benefits and other options for treatment, the patient has consented to  Procedure(s): LEFT HEART CATH AND CORONARY ANGIOGRAPHY (N/A) as a surgical intervention.  The patient's history has been reviewed, patient examined, no change in status, stable for surgery.  I have reviewed the patient's chart and labs.  Questions were answered to the patient's satisfaction.     Shelva Majestic

## 2020-06-26 NOTE — Plan of Care (Signed)
  Problem: Education: Goal: Knowledge of General Education information will improve Description: Including pain rating scale, medication(s)/side effects and non-pharmacologic comfort measures Outcome: Progressing   Problem: Health Behavior/Discharge Planning: Goal: Ability to manage health-related needs will improve Outcome: Progressing   Problem: Clinical Measurements: Goal: Ability to maintain clinical measurements within normal limits will improve Outcome: Progressing Goal: Will remain free from infection Outcome: Progressing Goal: Diagnostic test results will improve Outcome: Progressing Goal: Respiratory complications will improve Outcome: Progressing Goal: Cardiovascular complication will be avoided Outcome: Progressing   Problem: Activity: Goal: Risk for activity intolerance will decrease Outcome: Progressing   Problem: Nutrition: Goal: Adequate nutrition will be maintained Outcome: Progressing   Problem: Coping: Goal: Level of anxiety will decrease Outcome: Progressing   Problem: Elimination: Goal: Will not experience complications related to bowel motility Outcome: Progressing Goal: Will not experience complications related to urinary retention Outcome: Progressing   Problem: Pain Managment: Goal: General experience of comfort will improve Outcome: Progressing   Problem: Safety: Goal: Ability to remain free from injury will improve Outcome: Progressing   Problem: Skin Integrity: Goal: Risk for impaired skin integrity will decrease Outcome: Progressing   Problem: Education: Goal: Understanding of CV disease, CV risk reduction, and recovery process will improve Outcome: Progressing Goal: Individualized Educational Video(s) Outcome: Progressing   Problem: Activity: Goal: Ability to return to baseline activity level will improve Outcome: Progressing   Problem: Cardiovascular: Goal: Ability to achieve and maintain adequate cardiovascular perfusion  will improve Outcome: Progressing Goal: Vascular access site(s) Level 0-1 will be maintained Outcome: Progressing   Problem: Health Behavior/Discharge Planning: Goal: Ability to safely manage health-related needs after discharge will improve Outcome: Progressing   Problem: Education: Goal: Knowledge of condition and prescribed therapy will improve Outcome: Progressing   Problem: Cardiac: Goal: Will achieve and/or maintain adequate cardiac output Outcome: Progressing   Problem: Physical Regulation: Goal: Complications related to the disease process, condition or treatment will be avoided or minimized Outcome: Progressing

## 2020-06-26 NOTE — Progress Notes (Signed)
Received to holding area from [place] pre cath. Alert and oriented; denies chest pain, discomfort.

## 2020-06-27 ENCOUNTER — Encounter (HOSPITAL_COMMUNITY): Payer: Self-pay | Admitting: Cardiovascular Disease

## 2020-06-27 ENCOUNTER — Other Ambulatory Visit (HOSPITAL_COMMUNITY): Payer: Self-pay

## 2020-06-27 ENCOUNTER — Other Ambulatory Visit: Payer: Self-pay | Admitting: Cardiology

## 2020-06-27 ENCOUNTER — Encounter: Payer: Self-pay | Admitting: *Deleted

## 2020-06-27 DIAGNOSIS — R002 Palpitations: Secondary | ICD-10-CM | POA: Diagnosis not present

## 2020-06-27 DIAGNOSIS — I2510 Atherosclerotic heart disease of native coronary artery without angina pectoris: Secondary | ICD-10-CM | POA: Diagnosis present

## 2020-06-27 DIAGNOSIS — R55 Syncope and collapse: Secondary | ICD-10-CM

## 2020-06-27 DIAGNOSIS — R001 Bradycardia, unspecified: Secondary | ICD-10-CM | POA: Diagnosis not present

## 2020-06-27 DIAGNOSIS — I214 Non-ST elevation (NSTEMI) myocardial infarction: Principal | ICD-10-CM

## 2020-06-27 DIAGNOSIS — Z95820 Peripheral vascular angioplasty status with implants and grafts: Secondary | ICD-10-CM

## 2020-06-27 HISTORY — DX: Bradycardia, unspecified: R00.1

## 2020-06-27 HISTORY — DX: Peripheral vascular angioplasty status with implants and grafts: Z95.820

## 2020-06-27 HISTORY — DX: Non-ST elevation (NSTEMI) myocardial infarction: I21.4

## 2020-06-27 HISTORY — DX: Atherosclerotic heart disease of native coronary artery without angina pectoris: I25.10

## 2020-06-27 LAB — CBC
HCT: 44.8 % (ref 39.0–52.0)
Hemoglobin: 14.8 g/dL (ref 13.0–17.0)
MCH: 29 pg (ref 26.0–34.0)
MCHC: 33 g/dL (ref 30.0–36.0)
MCV: 87.8 fL (ref 80.0–100.0)
Platelets: 156 10*3/uL (ref 150–400)
RBC: 5.1 MIL/uL (ref 4.22–5.81)
RDW: 13.4 % (ref 11.5–15.5)
WBC: 5.2 10*3/uL (ref 4.0–10.5)
nRBC: 0 % (ref 0.0–0.2)

## 2020-06-27 LAB — BASIC METABOLIC PANEL
Anion gap: 8 (ref 5–15)
BUN: 12 mg/dL (ref 8–23)
CO2: 26 mmol/L (ref 22–32)
Calcium: 8.8 mg/dL — ABNORMAL LOW (ref 8.9–10.3)
Chloride: 105 mmol/L (ref 98–111)
Creatinine, Ser: 0.85 mg/dL (ref 0.61–1.24)
GFR, Estimated: >60 mL/min (ref >60)
Glucose, Bld: 153 mg/dL — ABNORMAL HIGH (ref 70–99)
Potassium: 4.2 mmol/L (ref 3.5–5.1)
Sodium: 139 mmol/L (ref 135–145)

## 2020-06-27 MED ORDER — NITROGLYCERIN 0.4 MG SL SUBL: 0.4000 mg | SUBLINGUAL_TABLET | SUBLINGUAL | Status: DC | PRN

## 2020-06-27 MED ORDER — TICAGRELOR 90 MG PO TABS
90.0000 mg | ORAL_TABLET | Freq: Two times a day (BID) | ORAL | 11 refills | Status: DC
  Filled 2020-06-27: qty 60, 30d supply, fill #0

## 2020-06-27 MED ORDER — NITROGLYCERIN 0.4 MG SL SUBL
0.4000 mg | SUBLINGUAL_TABLET | SUBLINGUAL | 4 refills | Status: DC | PRN
  Filled 2020-06-27: qty 25, 1d supply, fill #0

## 2020-06-27 MED ORDER — NITROGLYCERIN 0.4 MG SL SUBL: 0.4000 mg | SUBLINGUAL_TABLET | SUBLINGUAL | Status: AC | PRN

## 2020-06-27 MED ORDER — ESCITALOPRAM OXALATE 10 MG PO TABS
20.0000 mg | ORAL_TABLET | Freq: Every day | ORAL | Status: DC
  Administered 2020-06-27: 20 mg via ORAL
  Filled 2020-06-27: qty 2

## 2020-06-27 MED ORDER — ACETAMINOPHEN 325 MG PO TABS: 650.0000 mg | ORAL_TABLET | ORAL | Status: AC | PRN

## 2020-06-27 MED ORDER — ASPIRIN 81 MG PO CHEW: 81.0000 mg | CHEWABLE_TABLET | Freq: Every day | ORAL | Status: DC

## 2020-06-27 MED ORDER — ATORVASTATIN CALCIUM 80 MG PO TABS
80.0000 mg | ORAL_TABLET | Freq: Every day | ORAL | 6 refills | Status: AC
  Filled 2020-06-27: qty 30, 30d supply, fill #0

## 2020-06-27 NOTE — Progress Notes (Signed)
Pt is alert and oriented. Discharge instructions/ AVS given to pt. 

## 2020-06-27 NOTE — Progress Notes (Addendum)
Progress Note  Patient Name: Christopher Rogers Date of Encounter: 06/27/2020  [facility] Cardiologist: Jenne Campus, MD   Subjective   No chest pain, no SOB  Inpatient Medications    Scheduled Meds: . aspirin  81 mg Oral Daily  . atorvastatin  80 mg Oral Daily  . enoxaparin (LOVENOX) injection  40 mg Subcutaneous Q24H  . feeding supplement  237 mL Oral BID BM  . sodium chloride flush  3 mL Intravenous Q12H  . ticagrelor  90 mg Oral BID   Continuous Infusions: . sodium chloride    . sodium chloride 75 mL/hr at 06/26/20 2247   PRN Meds: sodium chloride, acetaminophen, diazepam, ondansetron (ZOFRAN) IV, sodium chloride flush   Vital Signs    Vitals:   06/26/20 1541 06/26/20 2115 06/26/20 2348 06/27/20 0414  BP: 104/61 114/67 134/80 122/74  Pulse: 66 64 64 (!) 54  Resp: 16 16  18   Temp: 98.6 F (37 C) 98.2 F (36.8 C) 98 F (36.7 C) 98.2 F (36.8 C)  TempSrc: Oral Oral  Oral  SpO2: 100% 97% 97% 98%  Weight:    80.5 kg    Intake/Output Summary (Last 24 hours) at 06/27/2020 0812 Last data filed at 06/27/2020 0558 Gross per 24 hour  Intake 1312.27 ml  Output 1200 ml  Net 112.27 ml   Last 3 Weights 06/27/2020 09/12/2019 06/23/2018  Weight (lbs) 177 lb 6.4 oz 193 lb 223 lb 12.3 oz  Weight (kg) 80.468 kg 87.544 kg 101.5 kg  Some encounter information is confidential and restricted. Go to Review Flowsheets activity to see all data.      Telemetry    SR - Personally Reviewed  ECG    SR at 60 and no acute changes.   - Personally Reviewed  Physical Exam   GEN: No acute distress.   Neck: No JVD Cardiac: RRR, no murmurs, rubs, or gallops.  Respiratory: Clear to auscultation bilaterally. GI: Soft, nontender, non-distended  MS: No edema; No deformity. Neuro:  Nonfocal  Psych: Normal affect   Labs    High Sensitivity Troponin:  No results for input(s): TROPONINIHS in the last 720 hours.    Chemistry Recent Labs  Lab 06/26/20 1617 06/27/20 0335   NA  --  139  K  --  4.2  CL  --  105  CO2  --  26  GLUCOSE  --  153*  BUN  --  12  CREATININE 0.86 0.85  CALCIUM  --  8.8*  GFRNONAA >60 >60  ANIONGAP  --  8     Hematology Recent Labs  Lab 06/26/20 1617 06/27/20 0335  WBC 5.9 5.2  RBC 5.14 5.10  HGB 15.0 14.8  HCT 44.4 44.8  MCV 86.4 87.8  MCH 29.2 29.0  MCHC 33.8 33.0  RDW 13.5 13.4  PLT 156 156    BNPNo results for input(s): BNP, PROBNP in the last 168 hours.   DDimer No results for input(s): DDIMER in the last 168 hours.   Radiology    CARDIAC CATHETERIZATION  Result Date: 06/26/2020  Prox LAD to Mid LAD lesion is 95% stenosed.  Ost LAD lesion is 40% stenosed.  1st Diag lesion is 50% stenosed.  Post intervention, there is a 0% residual stenosis.  Post intervention, there is a 35% residual stenosis.  A stent was successfully placed.  Single-vessel CAD involving the left anterior descending artery with 40% smooth ostial narrowing and 95% stenosis in the proximal LAD in the region  of the first diagonal takeoff with mild 40% ostial narrowing of the diagonal vessel. Normal ramus intermedius, left circumflex coronary artery, and RCA. Successful percutaneous coronary intervention to the subtotal LAD stenosis treated with initial 2.5 x 15 mm and 3.0 x 15 mm cutting balloon intervention followed by DES stenting with a 4.0 x 18 mm Resolute Onyx stent postdilated to 4.36 mm with a 95 to 99% stenosis being reduced to 0%.  There was mild additional ostial narrowing of the diagonal vessel up to 60 to 70% post high-pressure balloon inflation within the stented segment and PTCA of the diagonal vessel was performed with a 2.5 x 12 mm balloon with the stenosis being reduced to 35 to 40% ostially.  There is ostial narrowing of a septal perforating artery arising from the stented segment. LVEDP 3 mmHg. RECOMMENDATION: DAPT with aspirin/Brilinta.  Medical therapy for mild concomitant CAD involving the diagonal and ostial LAD.  Aggressive  lipid-lowering therapy with target LDL less than 70.  Will initiate atorvastatin 80 mg.  Cardiac telemetry with possible subsequent event monitoring post successful reperfusion of his high-grade LAD.    Cardiac Studies   Cardiac cath 06/26/20   Prox LAD to Mid LAD lesion is 95% stenosed.  Ost LAD lesion is 40% stenosed.  1st Diag lesion is 50% stenosed.  Post intervention, there is a 0% residual stenosis.  Post intervention, there is a 35% residual stenosis.  A stent was successfully placed.   Single-vessel CAD involving the left anterior descending artery with 40% smooth ostial narrowing and 95% stenosis in the proximal LAD in the region of the first diagonal takeoff with mild 40% ostial narrowing of the diagonal vessel.  Normal ramus intermedius, left circumflex coronary artery, and RCA.  Successful percutaneous coronary intervention to the subtotal LAD stenosis treated with initial 2.5 x 15 mm and 3.0 x 15 mm cutting balloon intervention followed by DES stenting with a 4.0 x 18 mm Resolute Onyx stent postdilated to 4.36 mm with a 95 to 99% stenosis being reduced to 0%.  There was mild additional ostial narrowing of the diagonal vessel up to 60 to 70% post high-pressure balloon inflation within the stented segment and PTCA of the diagonal vessel was performed with a 2.5 x 12 mm balloon with the stenosis being reduced to 35 to 40% ostially.  There is ostial narrowing of a septal perforating artery arising from the stented segment.  LVEDP 3 mmHg.  RECOMMENDATION: DAPT with aspirin/Brilinta.  Medical therapy for mild concomitant CAD involving the diagonal and ostial LAD.  Aggressive lipid-lowering therapy with target LDL less than 70.  Will initiate atorvastatin 80 mg.  Cardiac telemetry with possible subsequent event monitoring post successful reperfusion of his high-grade LAD.  Diagnostic Dominance: Right    Intervention      Echo at [facility] with normal EF.    Patient  Profile     60 y.o. male with ADD, HTN, syncope, HLD, DM seen for presyncope and found to have elevated troponin.  Has had severeal episodes of syncope over last 2 weeks.    Assessment & Plan    1. NSTEMI: Presented to [facility] with episodes of syncope and chest pain.  Troponin peaked at 0.7.  EKG today with isolated T wave inversion in lead III.  Transferred to [facility] and underwent  cardiac catheterization after being seen by Dr. Agustin Cree at [facility].  2.  CAD/PCI to LAD , did well overnight,  EKG SR at 60 and no acute  changes.  DAPT (ASA/Brilinta) Statin on lipitor 80 recheck hepatic and lipids in 6-8 weeks.    3. Syncope: reports having several episodes over the past 2 weeks. No arrhythmias indicated prior to transfer. Suspect will need cardiac monitor at the time of discharge.   Pt does have hx of brain tumor has been stable.  No driving.  Follow up with PCP  4. HTN: blood pressures stable on arrival -- planned to continue on home medications but coreg has been held.   5. DM: SSI while inpatient- glucose today 153  Resume insulin at home.   6. HLD: continue statin at high dose  7. Elevated LFTs: reports prior hx of ETOH use. will recheck Hepatic as outpt.    8. Palpitations- hx of this, will check for 30 day event monitor.   For questions or updates, please contact [place] Please consult [email or web address] for contact info under        Signed, Cecilie Kicks, NP  06/27/2020, 8:12 AM    Patient seen and examined with Cecilie Kicks NP.  Agree as above, with the following exceptions and changes as noted below. Feels well and is ready to go home. Gen: NAD, CV: RRR, no murmurs, Lungs: clear, Abd: soft, Extrem: Warm, well perfused, no edema, Neuro/Psych: alert and oriented x 3, normal mood and affect. All available labs, radiology testing, previous records reviewed. Due to syncope and bradycardia, will hold BB until follow up. Will order 30 day event monitor.  Stable for discharge today.   Elouise Munroe, MD 06/27/20 1:30 PM

## 2020-06-27 NOTE — Discharge Summary (Addendum)
Discharge Summary    Patient ID: Christopher Rogers MRN: [id number]; DOB: [birth year]  Admit date: 06/26/2020 Discharge date: 06/27/2020  PCP:  Algis Greenhouse, MD   [facility] Providers Cardiologist:  Jenne Campus, MD        Discharge Diagnoses    Principal Problem:   Non-ST elevation (NSTEMI) myocardial infarction [facility]) Active Problems:   Syncope and collapse   S/P angioplasty with stent to LAD 06/26/20   Essential hypertension   Unstable angina (HCC)   CAD in native artery   Bradycardia    Diagnostic Studies/Procedures    Cardiac cath 06/26/20   Prox LAD to Mid LAD lesion is 95% stenosed.  Ost LAD lesion is 40% stenosed.  1st Diag lesion is 50% stenosed.  Post intervention, there is a 0% residual stenosis.  Post intervention, there is a 35% residual stenosis.  A stent was successfully placed.  Single-vessel CAD involving the left anterior descending artery with 40% smooth ostial narrowing and 95% stenosis in the proximal LAD in the region of the first diagonal takeoff with mild 40% ostial narrowing of the diagonal vessel.  Normal ramus intermedius, left circumflex coronary artery, and RCA.  Successful percutaneous coronary intervention to the subtotal LAD stenosis treated with initial 2.5 x 15 mm and 3.0 x 15 mm cutting balloon intervention followed by DES stenting with a 4.0 x 18 mm Resolute Onyx stent postdilated to 4.36 mm with a 95 to 99% stenosis being reduced to 0%. There was mild additional ostial narrowing of the diagonal vessel up to 60 to 70% post high-pressure balloon inflation within the stented segment and PTCA of the diagonal vessel was performed with a 2.5 x 12 mm balloon with the stenosis being reduced to 35 to 40% ostially. There is ostial narrowing of a septal perforating artery arising from the stented segment.  LVEDP 3 mmHg.  RECOMMENDATION: DAPT with aspirin/Brilinta. Medical therapy for mild concomitant CAD involving the diagonal  and ostial LAD. Aggressive lipid-lowering therapy with target LDL less than 70. Will initiate atorvastatin 80 mg. Cardiac telemetry with possible subsequent event monitoring post successful reperfusion of his high-grade LAD.  Diagnostic Dominance: Right    Intervention      Echo at [facility] with normal EF.    _____________   History of Present Illness     Christopher Rogers is a 60 y.o. male with hx of ADD, HTN, syncope, HLD, DM seen at [facility] for syncope and found to have elevated troponin 0.70.  Prior to syncope he felt a fluttering in his chest. Then he would pass out.  Has had several episodes of syncope, all the same with palpitations first.  He also noted chest tightness at times.    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  May need pancreatic protocol CT or MRI imaging will defer to PCP.  Pt also relates he has been told he has a brain tumor but I do not find in record.  But with syncope preceded by palpitations.  His EKG with T wave inversion in lead 3. CXR no acute abnormality of lungs in AP portable position.  Echo at [facility] with EF 60-65%, elevated LA pressure, RV is normal size trace MR. Trace TR Pt seen and evaluated at [facility] by Dr. Agustin Cree and pt transferred to [facility] for cardiac cath.   Marland Kitchen   Hospital Course     Consultants: none  Pt arrived at [facility] without complications. He underwent cardiac cath with stent DES to LAD.  No further syncope.    Today no chest pain and no SOB.  Is able to ambulate with cardiac rehab without complications. Labs within normal limits.  Rt wrist is stable. Pt concerned about palpitations. We discussed and he will wear a monitor for 30 days.  No driving for now.  EKG is stable.  Has been seen by Dr. Margaretann Loveless and found stable for discharge.  NO BB due to HR in the 40s and recent syncope.  Will  re-address as outpt.   Please note pt mentioned at discharge he has a small brain tumor but we could not find record of this.  Will defer to his PCP.    Did the patient have an acute coronary syndrome (MI, NSTEMI, STEMI, etc) this admission?:  Yes                               AHA/ACC Clinical Performance & Quality Measures: 1. Aspirin prescribed? - Yes 2. ADP Receptor Inhibitor (Plavix/Clopidogrel, Brilinta/Ticagrelor or Effient/Prasugrel) prescribed (includes medically managed patients)? - Yes 3. Beta Blocker prescribed? - No - bradycardia with syncope hx. 4. High Intensity Statin (Lipitor 40-80mg  or Crestor 20-40mg ) prescribed? - Yes 5. EF assessed during THIS hospitalization? - Yes 6. For EF <40%, was ACEI/ARB prescribed? - Not Applicable (EF >/= 27%) 7. For EF <40%, Aldosterone Antagonist (Spironolactone or Eplerenone) prescribed? - Not Applicable (EF >/= 25%) 8. Cardiac Rehab Phase II ordered (including medically managed patients)? - Yes       _____________  Discharge Vitals Blood pressure 102/84, pulse (P) 80, temperature 98.2 F (36.8 C), temperature source Oral, resp. rate 12, weight 80.5 kg, SpO2 99 %.  Filed Weights   06/27/20 0414  Weight: 80.5 kg    Labs & Radiologic Studies    CBC Recent Labs    06/26/20 1617 06/27/20 0335  WBC 5.9 5.2  HGB 15.0 14.8  HCT 44.4 44.8  MCV 86.4 87.8  PLT 156 366   Basic Metabolic Panel Recent Labs    06/26/20 1617 06/27/20 0335  NA  --  139  K  --  4.2  CL  --  105  CO2  --  26  GLUCOSE  --  153*  BUN  --  12  CREATININE 0.86 0.85  CALCIUM  --  8.8*   Liver Function Tests No results for input(s): AST, ALT, ALKPHOS, BILITOT, PROT, ALBUMIN in the last 72 hours. No results for input(s): LIPASE, AMYLASE in the last 72 hours. High Sensitivity Troponin:   No results for input(s): TROPONINIHS in the last 720 hours.  BNP Invalid input(s): POCBNP D-Dimer No results for input(s): DDIMER in the last 72 hours. Hemoglobin  A1C No results for input(s): HGBA1C in the last 72 hours. Fasting Lipid Panel No results for input(s): CHOL, HDL, LDLCALC, TRIG, CHOLHDL, LDLDIRECT in the last 72 hours. Thyroid Function Tests No results for input(s): TSH, T4TOTAL, T3FREE, THYROIDAB in the last 72 hours.  Invalid input(s): FREET3 _____________  CARDIAC CATHETERIZATION  Result Date: 06/26/2020  Prox LAD to Mid LAD lesion is 95% stenosed.  Ost LAD lesion is 40% stenosed.  1st Diag lesion is 50% stenosed.  Post intervention, there is a 0% residual stenosis.  Post intervention, there is a 35% residual stenosis.  A stent was successfully placed.  Single-vessel CAD involving the left anterior descending artery with  40% smooth ostial narrowing and 95% stenosis in the proximal LAD in the region of the first diagonal takeoff with mild 40% ostial narrowing of the diagonal vessel. Normal ramus intermedius, left circumflex coronary artery, and RCA. Successful percutaneous coronary intervention to the subtotal LAD stenosis treated with initial 2.5 x 15 mm and 3.0 x 15 mm cutting balloon intervention followed by DES stenting with a 4.0 x 18 mm Resolute Onyx stent postdilated to 4.36 mm with a 95 to 99% stenosis being reduced to 0%.  There was mild additional ostial narrowing of the diagonal vessel up to 60 to 70% post high-pressure balloon inflation within the stented segment and PTCA of the diagonal vessel was performed with a 2.5 x 12 mm balloon with the stenosis being reduced to 35 to 40% ostially.  There is ostial narrowing of a septal perforating artery arising from the stented segment. LVEDP 3 mmHg. RECOMMENDATION: DAPT with aspirin/Brilinta.  Medical therapy for mild concomitant CAD involving the diagonal and ostial LAD.  Aggressive lipid-lowering therapy with target LDL less than 70.  Will initiate atorvastatin 80 mg.  Cardiac telemetry with possible subsequent event monitoring post successful reperfusion of his high-grade LAD.    Disposition   Pt is being discharged home today in good condition.  Follow-up Plans & Appointments   Call [facility] at [phone] if any bleeding, swelling or drainage at cath site.  May shower, no tub baths for 48 hours for groin sticks. No lifting over 5 pounds for 3 days.  No Driving due to syncope.    Take 1 NTG, under your tongue, while sitting.  If no relief of pain may repeat NTG, one tab every 5 minutes up to 3 tablets total over 15 minutes.  If no relief CALL 911.  If you have dizziness/lightheadness  while taking NTG, stop taking and call 911.        Heart healthy diabetic diet.    You should receive a heart monitor in the mail, if you do not call the office.    Take the asprin 81 mg and the Brilinta every day.  Important to keep stent open.  Stopping could cause a heart attack.   Your first appointment will be in [facility], only thing available so please go to that appointment, appointments after that appointment will be in [place].       Follow-up Information    Park Liter, MD Follow up on 07/11/2020.   Specialty: Cardiology Why: at 11:20 AM with his partner Dr. Harriet Masson.   - this appointment is in [facility] follow up after this will be in [place].   Contact information: [place] [address] [phone]              Discharge Instructions    Amb Referral to Cardiac Rehabilitation   Complete by: As directed    To [place]   Diagnosis:  Coronary Stents PTCA NSTEMI     After initial evaluation and assessments completed: Virtual Based Care may be provided alone or in conjunction with Phase 2 Cardiac Rehab based on patient barriers.: Yes      Discharge Medications   Allergies as of 06/27/2020   No Known Allergies     Medication List    STOP taking these medications   carvedilol 12.5 MG tablet Commonly known as: COREG     TAKE these medications   acetaminophen 325 MG tablet Commonly known as:  TYLENOL Take 2 tablets (650  mg total) by mouth every 4 (four) hours as needed for headache or mild pain.   aspirin 81 MG chewable tablet Chew 1 tablet (81 mg total) by mouth daily. Start taking on: Jun 28, 2020   atorvastatin 80 MG tablet Commonly known as: LIPITOR Take 80 mg by mouth daily. What changed: Another medication with the same name was added. Make sure you understand how and when to take each.   atorvastatin 80 MG tablet Commonly known as: LIPITOR Take 1 tablet (80 mg total) by mouth daily. Start taking on: Jun 28, 2020 What changed: You were already taking a medication with the same name, and this prescription was added. Make sure you understand how and when to take each.   Brilinta 90 MG Tabs tablet Generic drug: ticagrelor Take 1 tablet (90 mg total) by mouth 2 (two) times daily.   escitalopram 20 MG tablet Commonly known as: LEXAPRO Take 20 mg by mouth at bedtime.   Farxiga 10 MG Tabs tablet Generic drug: dapagliflozin propanediol Take 10 mg by mouth every morning.   multivitamin with minerals Tabs tablet Take 1 tablet daily by mouth.   nitroGLYCERIN 0.4 MG SL tablet Commonly known as: NITROSTAT Place 1 tablet (0.4 mg total) under the tongue every 5 (five) minutes as needed for chest pain.   TOUJEO SOLOSTAR [place] Inject 85 Units into the skin daily.          Outstanding Labs/Studies   CMP, 30 day event monitor ordered.    Duration of Discharge Encounter   Greater than 30 minutes including physician time.  Signed, Cecilie Kicks, NP 06/27/2020, 12:05 PM

## 2020-06-27 NOTE — Progress Notes (Signed)
CARDIAC REHAB PHASE I   PRE:  Rate/Rhythm: 80 SR    BP: sitting 110/68    SaO2: 97 RA  MODE:  Ambulation: 400 ft   POST:  Rate/Rhythm: 94 SR    BP: sitting 127/82     SaO2: 98 RA  Tolerated well at quick pace. Sts he did have some residual tightness in his chest after the cath, seems to have resolved now. VSS.  In depth discussion of MI, stents, Brilinta, restrictions, diet, exercise, NTG and CRPII. Pt very receptive. He has made major changes in his diet in the last 2 months to work on his A1C (needs back surgery). Eager to exercise. Will refer to [place], ACSM 06/27/2020 9:47 AM

## 2020-06-27 NOTE — Progress Notes (Signed)
Patient ID: Christopher Rogers, male   DOB: 08-16-1960, 60 y.o.   MRN: [id number] Patient enrolled for [employer] to ship a 30 day cardiac event monitor to his home. Letter with instructions mailed to patient.

## 2020-06-27 NOTE — Discharge Instructions (Addendum)
Radial Site Care  This sheet gives you information about how to care for yourself after your procedure. Your health care provider may also give you more specific instructions. If you have problems or questions, contact your health care provider. What can I expect after the procedure? After the procedure, it is common to have:  Bruising and tenderness at the catheter insertion area. Follow these instructions at home: Medicines  Take over-the-counter and prescription medicines only as told by your health care provider. Insertion site care  Follow instructions from your health care provider about how to take care of your insertion site. Make sure you: ? Wash your hands with soap and water before you change your bandage (dressing). If soap and water are not available, use hand sanitizer. ? Change your dressing as told by your health care provider. ? Leave stitches (sutures), skin glue, or adhesive strips in place. These skin closures may need to stay in place for 2 weeks or longer. If adhesive strip edges start to loosen and curl up, you may trim the loose edges. Do not remove adhesive strips completely unless your health care provider tells you to do that.  Check your insertion site every day for signs of infection. Check for: ? Redness, swelling, or pain. ? Fluid or blood. ? Pus or a bad smell. ? Warmth.  Do not take baths, swim, or use a hot tub until your health care provider approves.  You may shower 24-48 hours after the procedure, or as directed by your health care provider. ? Remove the dressing and gently wash the site with plain soap and water. ? Pat the area dry with a clean towel. ? Do not rub the site. That could cause bleeding.  Do not apply powder or lotion to the site. Activity  For 24 hours after the procedure, or as directed by your health care provider: ? Do not flex or bend the affected arm. ? Do not push or pull heavy objects with the affected arm. ? Do not drive  yourself home from the hospital or clinic. You may drive 24 hours after the procedure unless your health care provider tells you not to. ? Do not operate machinery or power tools.  Do not lift anything that is heavier than 10 lb (4.5 kg), or the limit that you are told, until your health care provider says that it is safe.  Ask your health care provider when it is okay to: ? Return to work or school. ? Resume usual physical activities or sports. ? Resume sexual activity.   General instructions  If the catheter site starts to bleed, raise your arm and put firm pressure on the site. If the bleeding does not stop, get help right away. This is a medical emergency.  If you went home on the same day as your procedure, a responsible adult should be with you for the first 24 hours after you arrive home.  Keep all follow-up visits as told by your health care provider. This is important. Contact a health care provider if:  You have a fever.  You have redness, swelling, or yellow drainage around your insertion site. Get help right away if:  You have unusual pain at the radial site.  The catheter insertion area swells very fast.  The insertion area is bleeding, and the bleeding does not stop when you hold steady pressure on the area.  Your arm or hand becomes pale, cool, tingly, or numb. These symptoms may represent a serious   problem that is an emergency. Do not wait to see if the symptoms will go away. Get medical help right away. Call your local emergency services (911 in the [place] Do not drive yourself to the hospital. Summary  After the procedure, it is common to have bruising and tenderness at the site.  Follow instructions from your health care provider about how to take care of your radial site wound. Check the wound every day for signs of infection.  Do not lift anything that is heavier than 10 lb (4.5 kg), or the limit that you are told, until your health care provider says that it  is safe. This information is not intended to replace advice given to you by your health care provider. Make sure you discuss any questions you have with your health care provider. Document Revised: 03/17/2017 Document Reviewed: 03/17/2017 Elsevier Patient Education  [facility] at [phone] if any bleeding, swelling or drainage at cath site.  May shower, no tub baths for 48 hours for groin sticks. No lifting over 5 pounds for 3 days.  No Driving due to syncope.    Take 1 NTG, under your tongue, while sitting.  If no relief of pain may repeat NTG, one tab every 5 minutes up to 3 tablets total over 15 minutes.  If no relief CALL 911.  If you have dizziness/lightheadness  while taking NTG, stop taking and call 911.        Heart healthy diabetic diet.    You should receive a heart monitor in the mail, if you do not call the office.    Take the asprin 81 mg and the Brilinta every day.  Important to keep stent open.  Stopping could cause a heart attack.   Your first appointment will be in [facility], only thing available so please go to that appointment, appointments after that appointment will be in [place].

## 2020-06-28 ENCOUNTER — Other Ambulatory Visit (HOSPITAL_COMMUNITY): Payer: Self-pay

## 2020-06-28 ENCOUNTER — Telehealth (HOSPITAL_BASED_OUTPATIENT_CLINIC_OR_DEPARTMENT_OTHER): Payer: Self-pay

## 2020-06-28 ENCOUNTER — Other Ambulatory Visit (HOSPITAL_BASED_OUTPATIENT_CLINIC_OR_DEPARTMENT_OTHER): Payer: Self-pay

## 2020-06-28 ENCOUNTER — Telehealth (HOSPITAL_COMMUNITY): Payer: Self-pay

## 2020-06-28 NOTE — Telephone Encounter (Signed)
Cardiac rehab referral for Ph.II faxed to [facility]. 

## 2020-06-28 NOTE — Telephone Encounter (Signed)
Pharmacy Transitions of Care Follow-up Telephone Call  Date of discharge: 06/27/2020 Discharge Diagnosis: NSTEMI  How have you been since you were released from the hospital? Patient reports feeling good, just moving a little slower. Does not report any concerns with his medications.  Medication changes made at discharge:  - START: Low dose aspirin, Brilinta, nitroglycerin  - STOPPED: carvedilol   - CHANGED: atorvastatin Medication changes verified by the patient? Yes    Medication Accessibility:  Home Pharmacy: [facility] on [address] in [place]  Was the patient provided with refills on discharged medications? Yes  Have all prescriptions been transferred from [facility] to home pharmacy? Yes  Is the patient able to afford medications? Yes . Eligible for $5 copay card for Brilinta     Medication Review:  TICAGRELOR (BRILINTA) Ticagrelor 90 mg BID initiated on 06/27/20.  - Educated patient on expected duration of therapy of aspirin with ticagrelor.  - Discussed importance of taking medication around the same time every day, - Reviewed potential DDIs with patient - Advised patient of medications to avoid (NSAIDs, aspirin maintenance doses>100 mg daily) - Educated that Tylenol (acetaminophen) will be the preferred analgesic to prevent risk of bleeding  - Emphasized importance of monitoring for signs and symptoms of bleeding (abnormal bruising, prolonged bleeding, nose bleeds, bleeding from gums, discolored urine, black tarry stools)  - Educated patient to notify doctor if shortness of breath or abnormal heartbeat occur - Advised patient to alert all providers of antiplatelet therapy prior to starting a new medication or having a procedure.  Follow-up Appointments:  PCP Hospital f/u appt confirmed? Yes Scheduled to see Berniece Salines on 07/11/20 @ 11:20am.    If their condition worsens, is the pt aware to call PCP or go to the Emergency Dept.? Yes  Final Patient  Assessment: Patient feels comfortable with medications after discussing side effects. He was initially apprehensive to start Jardiance, but will start taking the medication 06/29/20. His medications will be transferred to [facility] and a copay card will be provided for his Brilinta.

## 2020-07-01 ENCOUNTER — Ambulatory Visit (INDEPENDENT_AMBULATORY_CARE_PROVIDER_SITE_OTHER): Payer: PPO

## 2020-07-01 DIAGNOSIS — R002 Palpitations: Secondary | ICD-10-CM

## 2020-07-01 DIAGNOSIS — R55 Syncope and collapse: Secondary | ICD-10-CM

## 2020-07-02 ENCOUNTER — Other Ambulatory Visit (HOSPITAL_COMMUNITY): Payer: Self-pay

## 2020-07-02 ENCOUNTER — Other Ambulatory Visit (HOSPITAL_BASED_OUTPATIENT_CLINIC_OR_DEPARTMENT_OTHER): Payer: Self-pay

## 2020-07-03 ENCOUNTER — Telehealth: Payer: Self-pay | Admitting: Cardiology

## 2020-07-03 NOTE — Telephone Encounter (Signed)
Please ask him to stay on the [place] until I see him

## 2020-07-03 NOTE — Telephone Encounter (Signed)
Pt c/o medication issue:  1. Name of Medication:  ticagrelor (BRILINTA) 90 MG TABS tablet  2. How are you currently taking this medication (dosage and times per day)? 1 tablet (90 mg total) by mouth 2 (two) times daily  3. Are you having a reaction (difficulty breathing--STAT)?   4. What is your medication issue?  This medication was prescribed in the hospital. Patient assumes it is causing SOB. He states the SOB started when he started taking this medication and it occurs every time he takes it. He states yesterday he didn't take it just to see if he would still be SOB and he was asymptomatic. He took his first dose again today and he states the SOB is back.   Pt c/o Shortness Of Breath: STAT if SOB developed within the last 24 hours or pt is noticeably SOB on the phone  1. Are you currently SOB (can you hear that pt is SOB on the phone)?  No  2. How long have you been experiencing SOB?  The past 3 days  3. Are you SOB when sitting or when up moving around?  Both   4. Are you currently experiencing any other symptoms?  No

## 2020-07-04 DIAGNOSIS — Z955 Presence of coronary angioplasty implant and graft: Secondary | ICD-10-CM | POA: Diagnosis not present

## 2020-07-04 DIAGNOSIS — I252 Old myocardial infarction: Secondary | ICD-10-CM | POA: Diagnosis not present

## 2020-07-04 DIAGNOSIS — I10 Essential (primary) hypertension: Secondary | ICD-10-CM | POA: Diagnosis not present

## 2020-07-04 DIAGNOSIS — Z7982 Long term (current) use of aspirin: Secondary | ICD-10-CM | POA: Diagnosis not present

## 2020-07-04 DIAGNOSIS — Z79899 Other long term (current) drug therapy: Secondary | ICD-10-CM | POA: Diagnosis not present

## 2020-07-04 NOTE — Telephone Encounter (Signed)
Spoke with patient about staying on [place] until he sees Dr. Harriet Masson in office on 07/11/2020. He states "I was having severe shortness of breath, I thought I was going to have to call the Emergency Room. That is just not right, maybe its just my body. I just know I can't keep taking this." Patient states he will try the medication one more time "if it does that again I will not take it anymore." Patient verbalizes understanding of the need to stay on the medication but states he just could not breath. He thanked me for calling him back. Information will be relayed to Dr. Harriet Masson.

## 2020-07-09 DIAGNOSIS — G4700 Insomnia, unspecified: Secondary | ICD-10-CM | POA: Insufficient documentation

## 2020-07-09 DIAGNOSIS — F32A Depression, unspecified: Secondary | ICD-10-CM | POA: Insufficient documentation

## 2020-07-09 DIAGNOSIS — M1990 Unspecified osteoarthritis, unspecified site: Secondary | ICD-10-CM | POA: Insufficient documentation

## 2020-07-09 DIAGNOSIS — F419 Anxiety disorder, unspecified: Secondary | ICD-10-CM | POA: Insufficient documentation

## 2020-07-11 ENCOUNTER — Ambulatory Visit: Payer: PPO | Admitting: Cardiology

## 2020-07-11 ENCOUNTER — Other Ambulatory Visit: Payer: Self-pay

## 2020-07-11 ENCOUNTER — Encounter: Payer: Self-pay | Admitting: Cardiology

## 2020-07-11 VITALS — BP 102/70 | HR 77 | Ht 70.0 in | Wt 177.0 lb

## 2020-07-11 DIAGNOSIS — I10 Essential (primary) hypertension: Secondary | ICD-10-CM

## 2020-07-11 DIAGNOSIS — E118 Type 2 diabetes mellitus with unspecified complications: Secondary | ICD-10-CM | POA: Diagnosis not present

## 2020-07-11 DIAGNOSIS — Z794 Long term (current) use of insulin: Secondary | ICD-10-CM

## 2020-07-11 DIAGNOSIS — I2510 Atherosclerotic heart disease of native coronary artery without angina pectoris: Secondary | ICD-10-CM

## 2020-07-11 DIAGNOSIS — R55 Syncope and collapse: Secondary | ICD-10-CM

## 2020-07-11 MED ORDER — CLOPIDOGREL BISULFATE 75 MG PO TABS
75.0000 mg | ORAL_TABLET | Freq: Every day | ORAL | 3 refills | Status: DC
Start: 2020-07-11 — End: 2021-07-08

## 2020-07-11 NOTE — Progress Notes (Signed)
Cardiology Office Note:    Date:  07/11/2020   ID:  [name], DOB 02/17/1961, MRN [id number]  PCP:  Algis Greenhouse, MD  Cardiologist:  Jenne Campus, MD  Electrophysiologist:  None   Referring MD: Algis Greenhouse, MD     History of Present Illness:    Christopher Rogers is a 60 y.o. male with a hx of coronary artery disease status post PCI to the LAD which was done on Jun 26, 2020 as a result of a non-STEMI, syncope, hypertension, diabetes mellitus.  Patient presented initially to [facility] after syncopal event.  On presentation his troponin was elevated and he also admitted to some chest discomfort.  He was subsequently transferred to [facility] where he underwent a left heart catheterization and is now status post drug-eluting stent to his left anterior descending artery.  He was placed on aspirin and Brilinta.  He did have some shortness of breath on Brilinta and the patient elected to stop his Brilinta.  He denies any chest pain.   Past Medical History:  Diagnosis Date  . ADD (attention deficit hyperactivity disorder, inattentive type)   . Anxiety   . Arthritis   . Bradycardia 06/27/2020  . CAD in native artery 06/27/2020  . Chest pain    a. 12/2016 Eval @ [facility]- reportedly neg MV w/ hypertensive response to exercise.  . Depression   . Essential hypertension    a. Dx 12/2016.  Marland Kitchen Insomnia   . Morbid obesity ([place])   . 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 Non-ST elevation (NSTEMI) myocardial infarction ([place]) 06/27/2020  . S/P angioplasty with stent to LAD 06/26/20 06/27/2020    Past Surgical History:  Procedure Laterality Date  . BICEPS TENDON REPAIR Left   . COLONOSCOPY    . CORONARY STENT INTERVENTION N/A 06/26/2020   Procedure: CORONARY STENT INTERVENTION;  Surgeon: Troy Sine, MD;  Location: [facility];  Service: Cardiovascular;  Laterality: N/A;  . EYE SURGERY    . LEFT HEART CATH AND CORONARY ANGIOGRAPHY N/A  06/26/2020   Procedure: LEFT HEART CATH AND CORONARY ANGIOGRAPHY;  Surgeon: Troy Sine, MD;  Location: [facility];  Service: Cardiovascular;  Laterality: N/A;  . LUMBAR LAMINECTOMY/DECOMPRESSION MICRODISCECTOMY Right 04/20/2013   Procedure: L3-L4 DECOMPRESSION DISCECTOMY (1 LEVEL);  Surgeon: Melina Schools, MD;  Location: [facility];  Service: Orthopedics;  Laterality: Right;  . PROSTATECTOMY N/A 12/07/2014   Procedure: OPEN SUPRAPUBIC PROSTATECTOMY ;  Surgeon: Carolan Clines, MD;  Location: [facility];  Service: Urology;  Laterality: N/A;  . SHOULDER ARTHROSCOPY Left    clavical repair x2  . VARICOSE VEIN SURGERY Bilateral   . WISDOM TOOTH EXTRACTION      Current Medications: Current Meds  Medication Sig  . acetaminophen (TYLENOL) 325 MG tablet Take 2 tablets (650 mg total) by mouth every 4 (four) hours as needed for headache or mild pain.  Marland Kitchen aspirin 81 MG chewable tablet Chew 1 tablet (81 mg total) by mouth daily.  Marland Kitchen atorvastatin (LIPITOR) 80 MG tablet Take 1 tablet (80 mg total) by mouth daily.  . clopidogrel (PLAVIX) 75 MG tablet Take 1 tablet (75 mg total) by mouth daily. Take 300 mg (first dose - 4 pills) then 75 mg once daily (1 pill)  . empagliflozin (JARDIANCE) 25 MG TABS tablet Take 25 mg by mouth daily.  Marland Kitchen escitalopram (LEXAPRO) 20 MG tablet Take 20 mg by mouth at bedtime.   . Insulin  Glargine (TOUJEO SOLOSTAR [place]) Inject 85 Units into the skin daily.  . Multiple Vitamin (MULTIVITAMIN WITH MINERALS) TABS tablet Take 1 tablet daily by mouth.  . nitroGLYCERIN (NITROSTAT) 0.4 MG SL tablet Place 1 tablet (0.4 mg total) under the tongue every 5 (five) minutes as needed for chest pain.  . [DISCONTINUED] ticagrelor (BRILINTA) 90 MG TABS tablet Take 1 tablet (90 mg total) by mouth 2 (two) times daily.   Current Facility-Administered Medications for the 07/11/20 encounter (Office Visit) with Berniece Salines, DO  Medication  . nitroGLYCERIN (NITROSTAT) SL tablet 0.4 mg     Allergies:    Patient has no known allergies.   Social History   Socioeconomic History  . Marital status: Married    Spouse name: Not on file  . Number of children: Not on file  . Years of education: Not on file  . Highest education level: Not on file  Occupational History    Comment: Therapist, [employer]  Tobacco Use  . Smoking status: Never Smoker  . Smokeless tobacco: Current User    Types: Chew  Vaping Use  . Vaping Use: Never used  Substance and Sexual Activity  . Alcohol use: Not Currently    Comment: sober for 2 years, past alcoholic  . Drug use: No  . Sexual activity: Not on file    Comment: 1 can per month  Other Topics Concern  . Not on file  Social History Narrative   Lives in [place] with his mother.  Separated from wife x 15 mos.  2 grown children, 4 grandchildren.  Exercises most days of the week - walks on treadmill for an hour - 2.4 mph, no incline.   Social Determinants of Health   Financial Resource Strain: Not on file  Food Insecurity: Not on file  Transportation Needs: Not on file  Physical Activity: Not on file  Stress: Not on file  Social Connections: Not on file     Family History: The patient's family history includes Alzheimer's disease in his father; Other in his mother.  ROS:   Review of Systems  Constitution: Negative for decreased appetite, fever and weight gain.  HENT: Negative for congestion, ear discharge, hoarse voice and sore throat.   Eyes: Negative for discharge, redness, vision loss in right eye and visual halos.  Cardiovascular: Negative for chest pain, dyspnea on exertion, leg swelling, orthopnea and palpitations.  Respiratory: Negative for cough, hemoptysis, shortness of breath and snoring.   Endocrine: Negative for heat intolerance and polyphagia.  Hematologic/Lymphatic: Negative for bleeding problem. Does not bruise/bleed easily.  Skin: Negative for flushing, nail changes, rash and suspicious lesions.  Musculoskeletal: Negative for  arthritis, joint pain, muscle cramps, myalgias, neck pain and stiffness.  Gastrointestinal: Negative for abdominal pain, bowel incontinence, diarrhea and excessive appetite.  Genitourinary: Negative for decreased libido, genital sores and incomplete emptying.  Neurological: Negative for brief paralysis, focal weakness, headaches and loss of balance.  Psychiatric/Behavioral: Negative for altered mental status, depression and suicidal ideas.  Allergic/Immunologic: Negative for HIV exposure and persistent infections.    EKGs/Labs/Other Studies Reviewed:    The following studies were reviewed today:   EKG: None today  LHC  Cardiac cath 06/26/20   Prox LAD to Mid LAD lesion is 95% stenosed.  Ost LAD lesion is 40% stenosed.  1st Diag lesion is 50% stenosed.  Post intervention, there is a 0% residual stenosis.  Post intervention, there is a 35% residual stenosis.  A stent was successfully placed.  Single-vessel  CAD involving the left anterior descending artery with 40% smooth ostial narrowing and 95% stenosis in the proximal LAD in the region of the first diagonal takeoff with mild 40% ostial narrowing of the diagonal vessel.  Normal ramus intermedius, left circumflex coronary artery, and RCA.  Successful percutaneous coronary intervention to the subtotal LAD stenosis treated with initial 2.5 x 15 mm and 3.0 x 15 mm cutting balloon intervention followed by DES stenting with a 4.0 x 18 mm Resolute Onyx stent postdilated to 4.36 mm with a 95 to 99% stenosis being reduced to 0%. There was mild additional ostial narrowing of the diagonal vessel up to 60 to 70% post high-pressure balloon inflation within the stented segment and PTCA of the diagonal vessel was performed with a 2.5 x 12 mm balloon with the stenosis being reduced to 35 to 40% ostially. There is ostial narrowing of a septal perforating artery arising from the stented segment.  LVEDP 3 mmHg.  RECOMMENDATION: DAPT with  aspirin/Brilinta. Medical therapy for mild concomitant CAD involving the diagonal and ostial LAD. Aggressive lipid-lowering therapy with target LDL less than 70. Will initiate atorvastatin 80 mg. Cardiac telemetry with possible subsequent event monitoring post successful reperfusion of his high-grade LAD.  Diagnostic  Recent Labs: 06/27/2020: BUN 12; Creatinine, Ser 0.85; Hemoglobin 14.8; Platelets 156; Potassium 4.2; Sodium 139  Recent Lipid Panel    Component Value Date/Time   CHOL 175 06/19/2018 0336   TRIG 227 (H) 06/19/2018 0336   HDL 34 (L) 06/19/2018 0336   CHOLHDL 5.1 06/19/2018 0336   VLDL 45 (H) 06/19/2018 0336   LDLCALC 96 06/19/2018 0336    Physical Exam:    VS:  BP 102/70   Pulse 77   Ht 5\' 10"  (1.778 m)   Wt 177 lb (80.3 kg)   SpO2 98%   BMI 25.40 kg/m     Wt Readings from Last 3 Encounters:  07/11/20 177 lb (80.3 kg)  06/27/20 177 lb 6.4 oz (80.5 kg)  09/12/19 193 lb (87.5 kg)     GEN: Well nourished, well developed in no acute distress HEENT: Normal NECK: No JVD; No carotid bruits LYMPHATICS: No lymphadenopathy CARDIAC: S1S2 noted,RRR, no murmurs, rubs, gallops RESPIRATORY:  Clear to auscultation without rales, wheezing or rhonchi  ABDOMEN: Soft, non-tender, non-distended, +bowel sounds, no guarding. EXTREMITIES: No edema, No cyanosis, no clubbing MUSCULOSKELETAL:  No deformity  SKIN: Warm and dry NEUROLOGIC:  Alert and oriented x 3, non-focal PSYCHIATRIC:  Normal affect, good insight  ASSESSMENT:    1. Essential hypertension   2. CAD in native artery   3. Type 2 diabetes mellitus with complication, with long-term current use of insulin ([place])   4. Syncope and collapse    PLAN:     No anginal symptoms.  Talk to the patient about adhering to dual antiplatelet medication.  He has stopped the Brilinta due to shortness of breath.  What I like to do today is called the patient with clopidogrel/Plavix 300 mg x 1 dose and then 75 mg  thereafter.  Will continue his atorvastatin 80 mg daily.  We will get repeat lipid profile at his next office visit.  He is participating in cardiac rehab and he really enjoys that, and believes that this is really helping get him back to his baseline  He does have his 30-day ambulatory monitor on him today.  This is being managed by his primary care doctor.  No adjustments for antidiabetic medications were made today.  The patient is in agreement with the above plan. The patient left the office in stable condition.  The patient will follow up in   Medication Adjustments/Labs and Tests Ordered: Current medicines are reviewed at length with the patient today.  Concerns regarding medicines are outlined above.  No orders of the defined types were placed in this encounter.  Meds ordered this encounter  Medications  . clopidogrel (PLAVIX) 75 MG tablet    Sig: Take 1 tablet (75 mg total) by mouth daily. Take 300 mg (first dose - 4 pills) then 75 mg once daily (1 pill)    Dispense:  90 tablet    Refill:  3    After first fill please remove [Take 300 mg (first dose - 4 pills)]    Patient Instructions  Medication Instructions:  Your physician has recommended you make the following change in your medication:  STOP: Brilinta START: Plavix 300 mg (first dose - 4 pills) then 75 mg once daily (1 pill)   *If you need a refill on your cardiac medications before your next appointment, please call your pharmacy*   Lab Work: None If you have labs (blood work) drawn today and your tests are completely normal, you will receive your results only by: Marland Kitchen MyChart Message (if you have MyChart) OR . A paper copy in the mail If you have any lab test that is abnormal or we need to change your treatment, we will call you to review the results.   Testing/Procedures: None   Follow-Up: At [facility], you and your health needs are our priority.  As part of our continuing mission to provide you  with exceptional heart care, we have created designated Provider Care Teams.  These Care Teams include your primary Cardiologist (physician) and Advanced Practice Providers (APPs -  Physician Assistants and Nurse Practitioners) who all work together to provide you with the care you need, when you need it.  We recommend signing up for the patient portal called "MyChart".  Sign up information is provided on this After Visit Summary.  MyChart is used to connect with patients for Virtual Visits (Telemedicine).  Patients are able to view lab/test results, encounter notes, upcoming appointments, etc.  Non-urgent messages can be sent to your provider as well.   To learn more about what you can do with MyChart, go to [email or web address].    Your next appointment:   12 week(s)  The format for your next appointment:   In Person  Provider:   Berniece Salines, DO   Other Instructions      Adopting a Healthy Lifestyle.  Know what a healthy weight is for you (roughly BMI <25) and aim to maintain this   Aim for 7+ servings of fruits and vegetables daily   65-80+ fluid ounces of water or unsweet tea for healthy kidneys   Limit to max 1 drink of alcohol per day; avoid smoking/tobacco   Limit animal fats in diet for cholesterol and heart health - choose grass fed whenever available   Avoid highly processed foods, and foods high in saturated/trans fats   Aim for low stress - take time to unwind and care for your mental health   Aim for 150 min of moderate intensity exercise weekly for heart health, and weights twice weekly for bone health   Aim for 7-9 hours of sleep daily   When it comes to diets, agreement about the perfect plan isnt easy to find, even among the experts. Experts at the  [place] developed an idea known as the Healthy Eating Plate. Just imagine a plate divided into logical, healthy portions.   The emphasis is on diet quality:   Load up on vegetables  and fruits - one-half of your plate: Aim for color and variety, and remember that potatoes dont count.   Go for whole grains - one-quarter of your plate: Whole wheat, barley, wheat berries, quinoa, oats, brown rice, and foods made with them. If you want pasta, go with whole wheat pasta.   Protein power - one-quarter of your plate: Fish, chicken, beans, and nuts are all healthy, versatile protein sources. Limit red meat.   The diet, however, does go beyond the plate, offering a few other suggestions.   Use healthy plant oils, such as olive, canola, soy, corn, sunflower and peanut. Check the labels, and avoid partially hydrogenated oil, which have unhealthy trans fats.   If youre thirsty, drink water. Coffee and tea are good in moderation, but skip sugary drinks and limit milk and dairy products to one or two daily servings.   The type of carbohydrate in the diet is more important than the amount. Some sources of carbohydrates, such as vegetables, fruits, whole grains, and beans-are healthier than others.   Finally, stay active  Signed, Berniece Salines, DO  07/11/2020 11:36 AM    [place]

## 2020-07-11 NOTE — Patient Instructions (Signed)
Medication Instructions:  Your physician has recommended you make the following change in your medication:  STOP: Brilinta START: Plavix 300 mg (first dose - 4 pills) then 75 mg once daily (1 pill)   *If you need a refill on your cardiac medications before your next appointment, please call your pharmacy*   Lab Work: None If you have labs (blood work) drawn today and your tests are completely normal, you will receive your results only by: Marland Kitchen MyChart Message (if you have MyChart) OR . A paper copy in the mail If you have any lab test that is abnormal or we need to change your treatment, we will call you to review the results.   Testing/Procedures: None   Follow-Up: At [facility], you and your health needs are our priority.  As part of our continuing mission to provide you with exceptional heart care, we have created designated Provider Care Teams.  These Care Teams include your primary Cardiologist (physician) and Advanced Practice Providers (APPs -  Physician Assistants and Nurse Practitioners) who all work together to provide you with the care you need, when you need it.  We recommend signing up for the patient portal called "MyChart".  Sign up information is provided on this After Visit Summary.  MyChart is used to connect with patients for Virtual Visits (Telemedicine).  Patients are able to view lab/test results, encounter notes, upcoming appointments, etc.  Non-urgent messages can be sent to your provider as well.   To learn more about what you can do with MyChart, go to [email or web address].    Your next appointment:   12 week(s)  The format for your next appointment:   In Person  Provider:   Berniece Salines, DO   Other Instructions

## 2020-07-12 DIAGNOSIS — E119 Type 2 diabetes mellitus without complications: Secondary | ICD-10-CM | POA: Diagnosis not present

## 2020-07-12 DIAGNOSIS — I252 Old myocardial infarction: Secondary | ICD-10-CM | POA: Diagnosis not present

## 2020-07-12 DIAGNOSIS — Z794 Long term (current) use of insulin: Secondary | ICD-10-CM | POA: Diagnosis not present

## 2020-07-12 DIAGNOSIS — R000 Tachycardia, unspecified: Secondary | ICD-10-CM | POA: Diagnosis not present

## 2020-07-12 DIAGNOSIS — Z79899 Other long term (current) drug therapy: Secondary | ICD-10-CM | POA: Diagnosis not present

## 2020-07-12 DIAGNOSIS — R002 Palpitations: Secondary | ICD-10-CM | POA: Diagnosis not present

## 2020-07-12 DIAGNOSIS — Z7982 Long term (current) use of aspirin: Secondary | ICD-10-CM | POA: Diagnosis not present

## 2020-07-12 DIAGNOSIS — R0602 Shortness of breath: Secondary | ICD-10-CM | POA: Diagnosis not present

## 2020-07-12 DIAGNOSIS — I10 Essential (primary) hypertension: Secondary | ICD-10-CM | POA: Diagnosis not present

## 2020-07-24 DIAGNOSIS — Z955 Presence of coronary angioplasty implant and graft: Secondary | ICD-10-CM | POA: Diagnosis not present

## 2020-07-24 DIAGNOSIS — I252 Old myocardial infarction: Secondary | ICD-10-CM | POA: Diagnosis not present

## 2020-07-25 ENCOUNTER — Ambulatory Visit: Payer: PPO | Admitting: Cardiology

## 2020-08-16 DIAGNOSIS — K120 Recurrent oral aphthae: Secondary | ICD-10-CM | POA: Diagnosis not present

## 2020-08-23 DIAGNOSIS — I252 Old myocardial infarction: Secondary | ICD-10-CM | POA: Diagnosis not present

## 2020-08-23 DIAGNOSIS — Z955 Presence of coronary angioplasty implant and graft: Secondary | ICD-10-CM | POA: Diagnosis not present

## 2020-08-23 IMAGING — DX PORTABLE CHEST - 1 VIEW
1 series · 1 of 1 positions shown · non-contrast
Comparison: None.

CLINICAL DATA: Nasogastric intubation

EXAM:
PORTABLE ABDOMEN - 1 VIEW; PORTABLE CHEST - 1 VIEW

[chest ap]
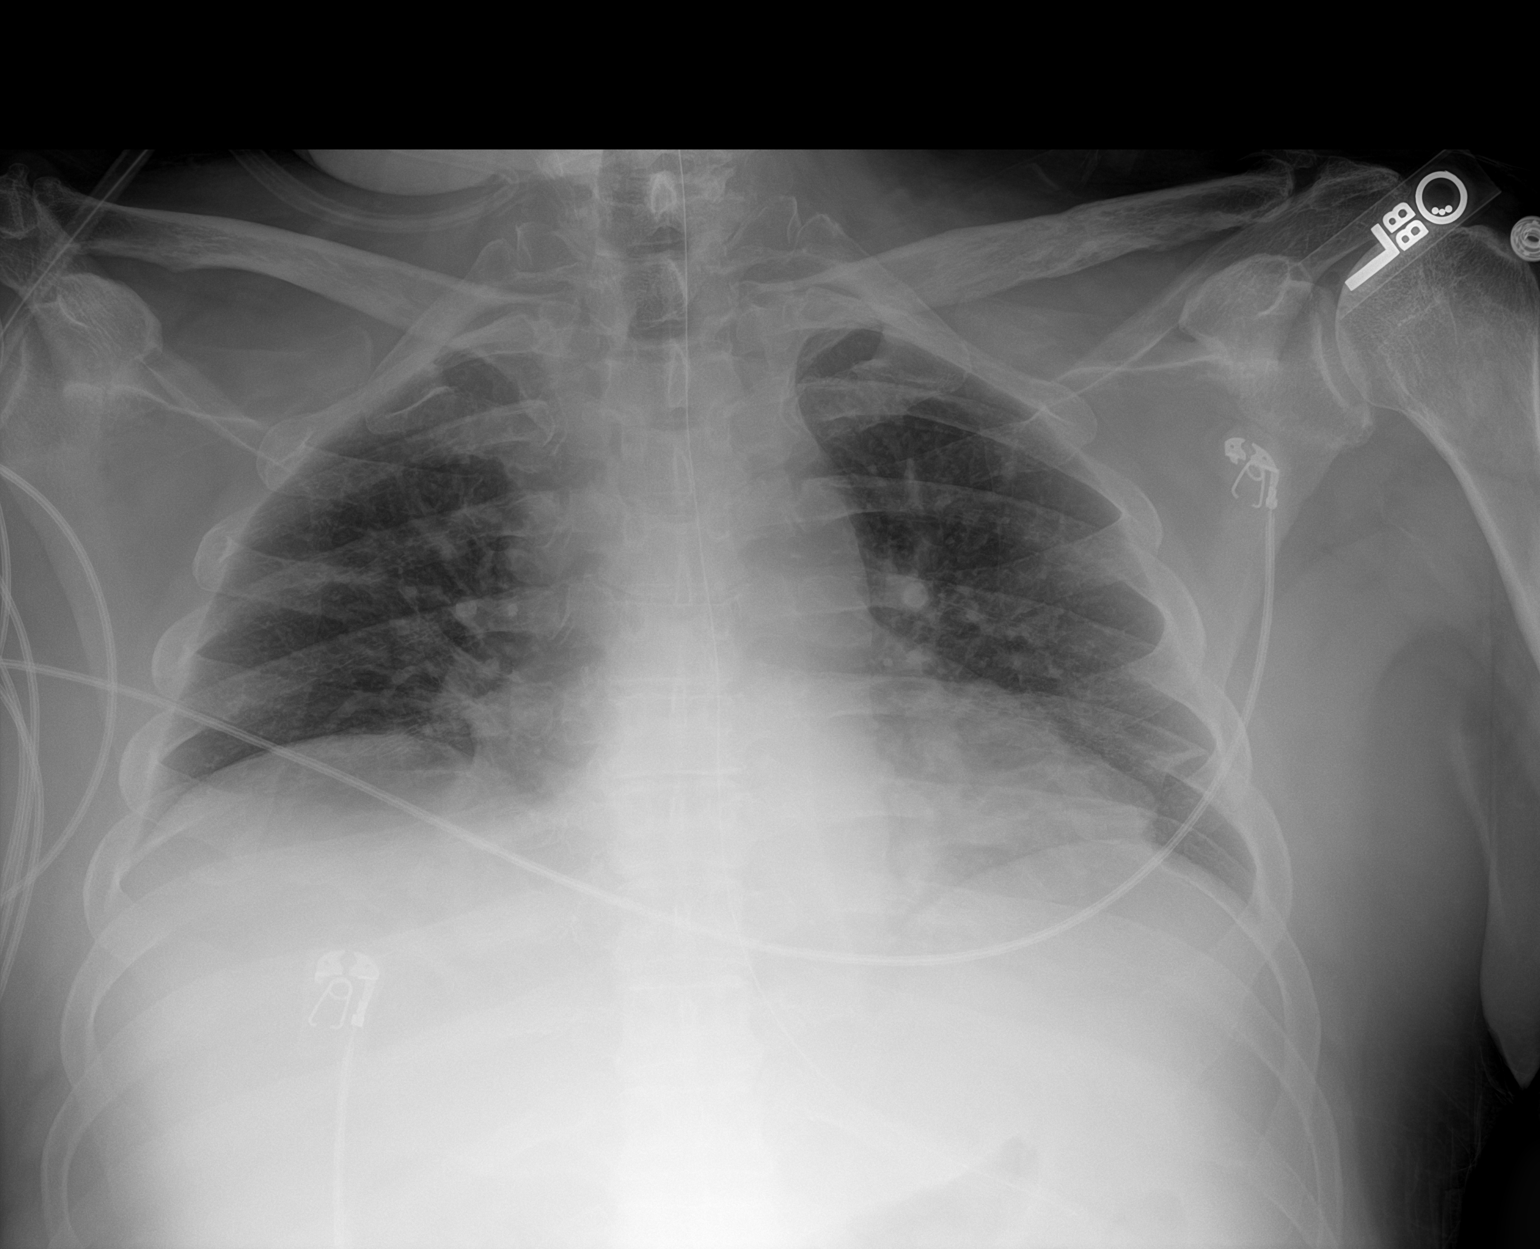

[1 of 1 positions shown; findings below may reference images not displayed]

FINDINGS: The tip and side port of the nasogastric tube project over the
stomach. There are mildly dilated loops of small bowel in the
central abdomen.

There is shallow lung inflation without focal airspace
consolidation. No pulmonary edema. Normal cardiomediastinal
contours.
IMPRESSION: Tip and side port of NG tube project over the stomach.

## 2020-08-25 IMAGING — US ULTRASOUND ABDOMEN LIMITED
2 series · 14 of 25 positions shown · non-contrast
Comparison: None.

CLINICAL DATA: Elevated liver function tests.

EXAM:
ULTRASOUND ABDOMEN LIMITED RIGHT UPPER QUADRANT

[Series 1: ultrasound abdomen limited · 13 of 85 slices shown (1 of 2)]
[im 1/85]
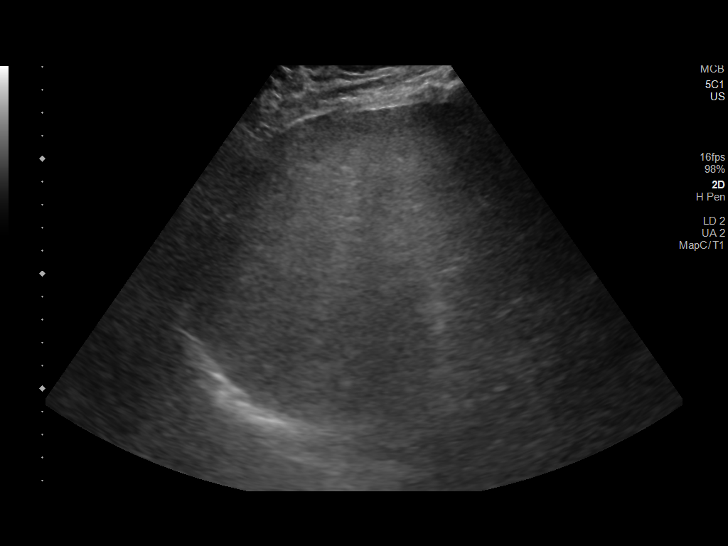
[im 8/85]
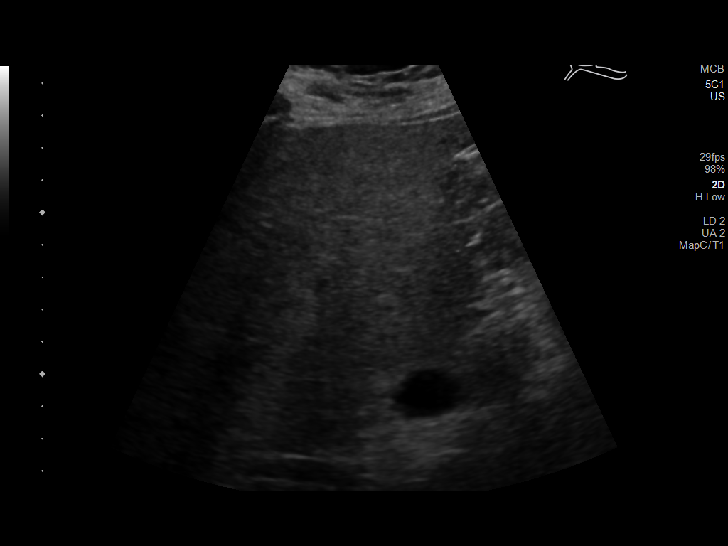
[im 15/85]
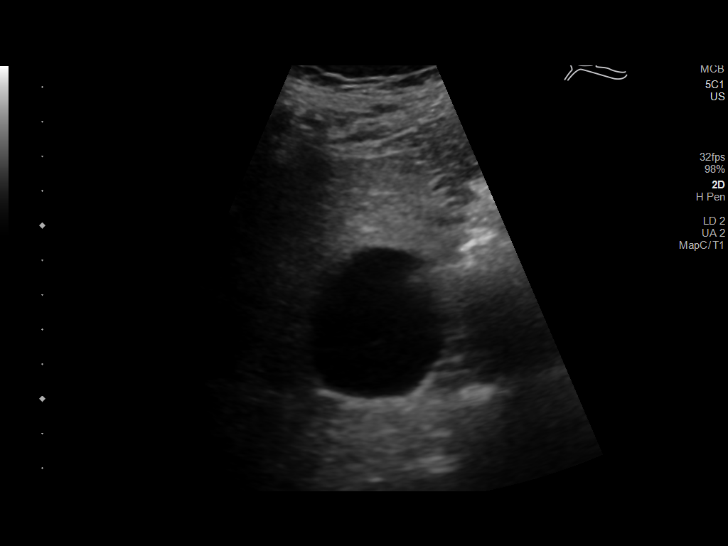
[im 22/85]
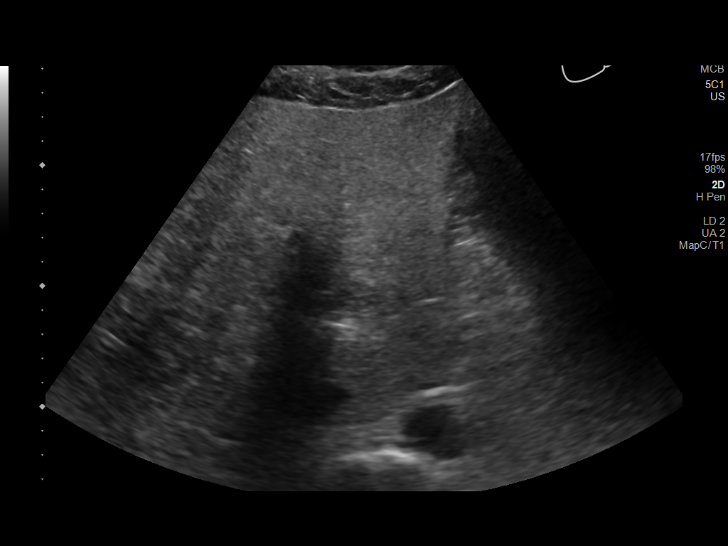
[im 30/85]
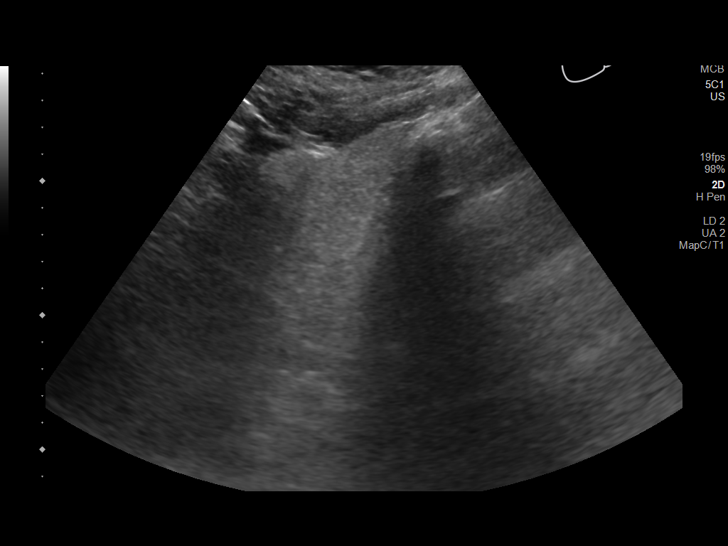
[im 33/85]
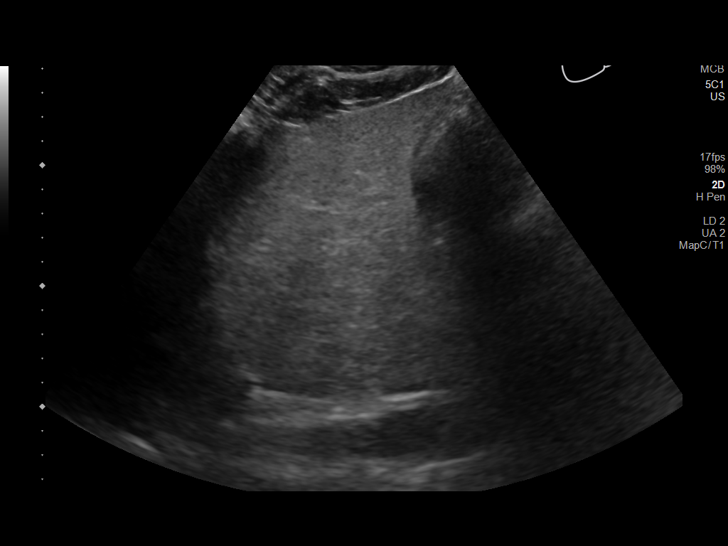
[im 41/85]
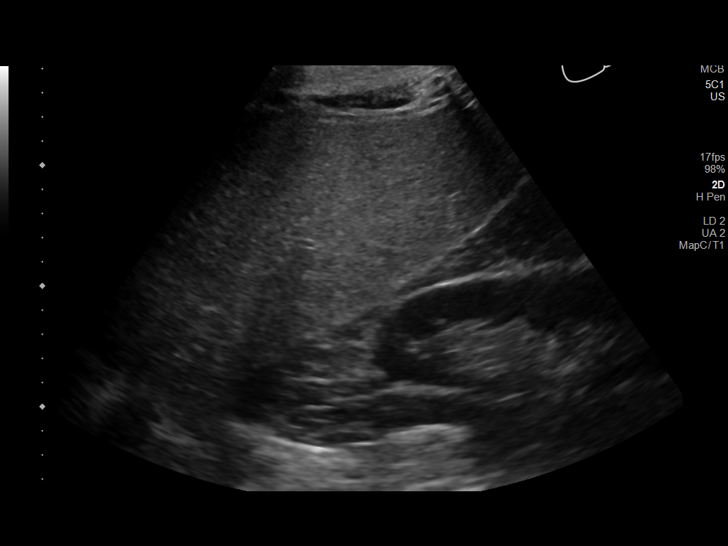
[im 48/85]
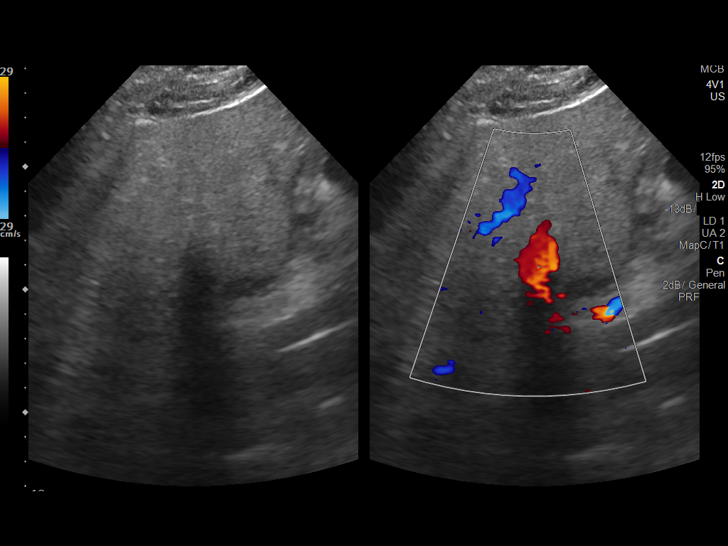
[im 55/85]
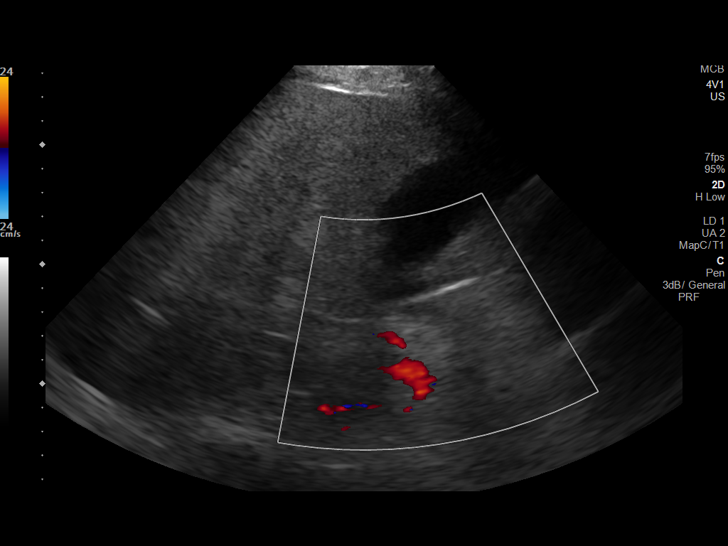
[im 59/85]
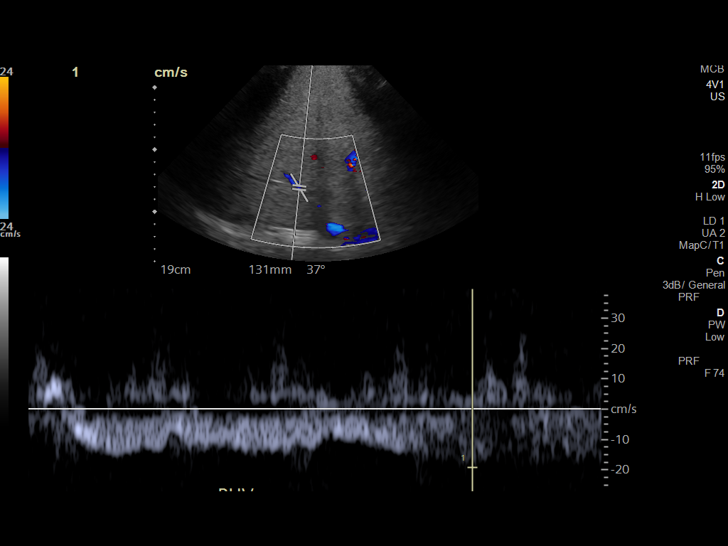
[im 66/85]
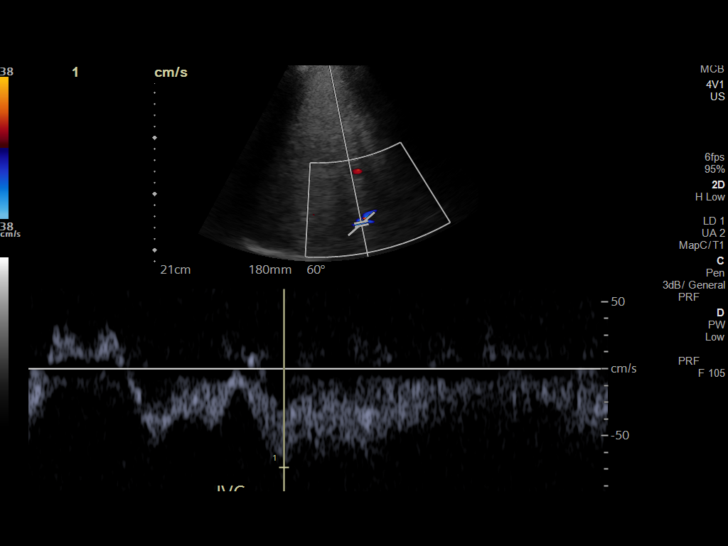
[im 74/85]
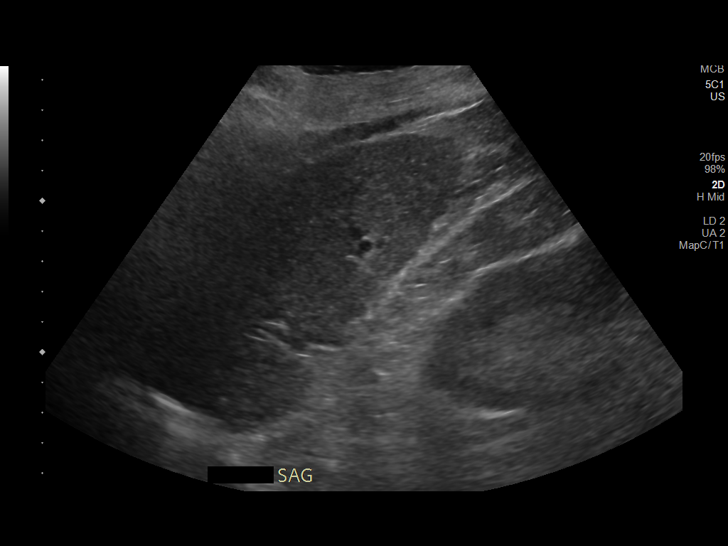
[im 81/85]
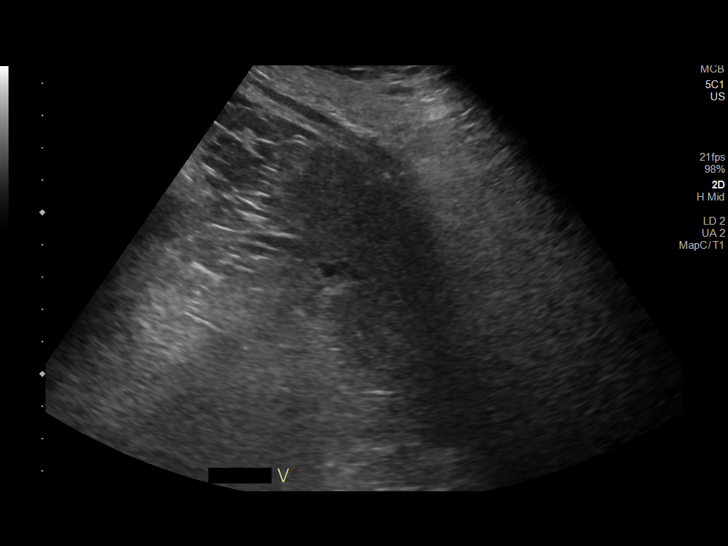

[Series 2: ultrasound abdomen limited · 1 of 1 slices shown (2 of 2)]
[im 1/1]
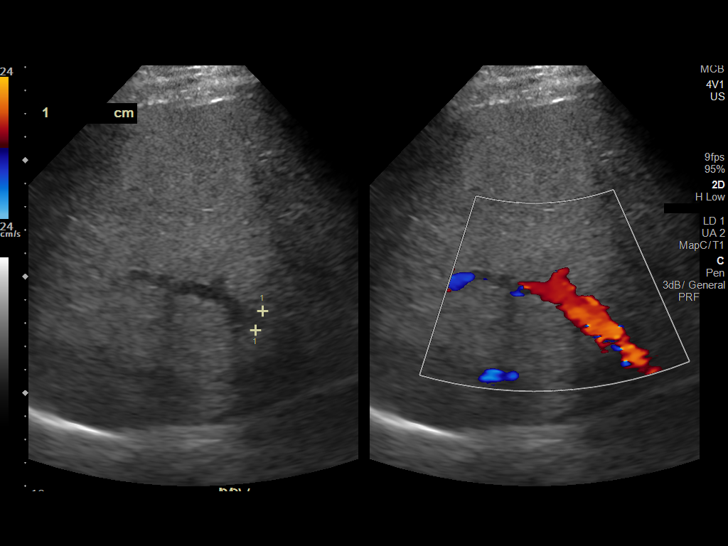

[14 of 25 positions shown; findings below may reference images not displayed]

FINDINGS: Gallbladder:

No gallstones or wall thickening visualized. No sonographic Murphy
sign noted by sonographer.

Common bile duct:

Diameter: 4 mm, normal

Liver:

No focal lesion identified. Increased echogenicity of the liver
parenchyma. Portal vein is patent on color Doppler imaging with
normal direction of blood flow towards the liver.
IMPRESSION: Increased echogenicity of the liver parenchyma suggesting hepatic
steatosis. Otherwise, normal exam.

## 2020-08-28 IMAGING — US ULTRASOUND ABDOMEN LIMITED
1 series · 5 of 5 positions shown · non-contrast
Comparison: Ultrasound 06/21/2018

CLINICAL DATA: Evaluate for ascites and paracentesis.

EXAM:
LIMITED ABDOMEN ULTRASOUND FOR ASCITES
TECHNIQUE: Limited ultrasound survey for ascites was performed in all four
abdominal quadrants.

[Series 1: ir (id) (id)/(id)/(id) ir · 5 of 5 slices shown]
[im 1/5]
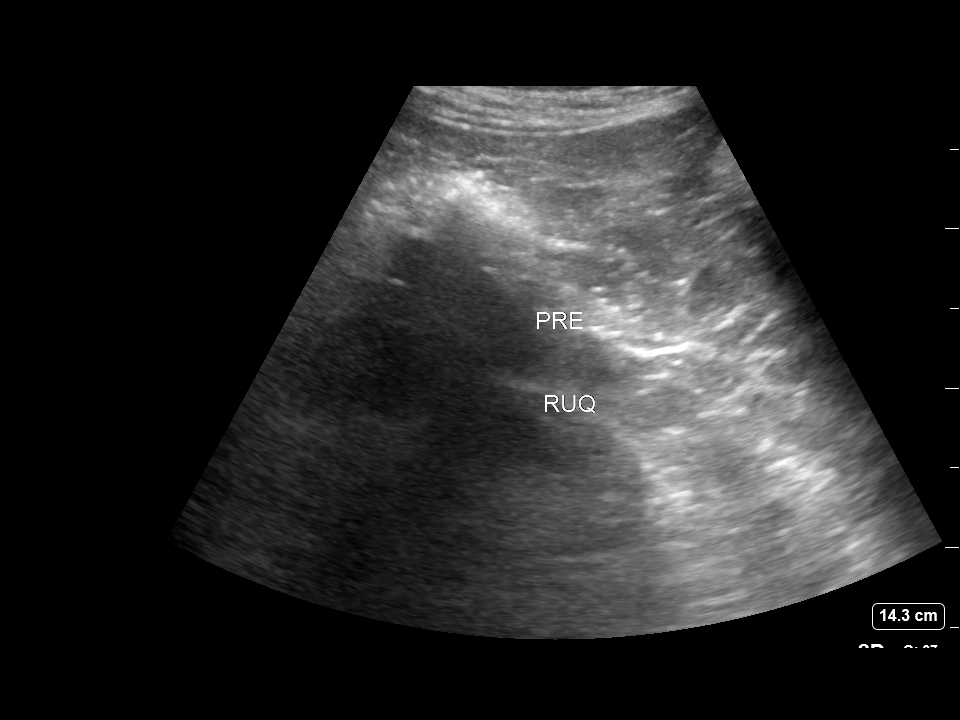
[im 2/5]
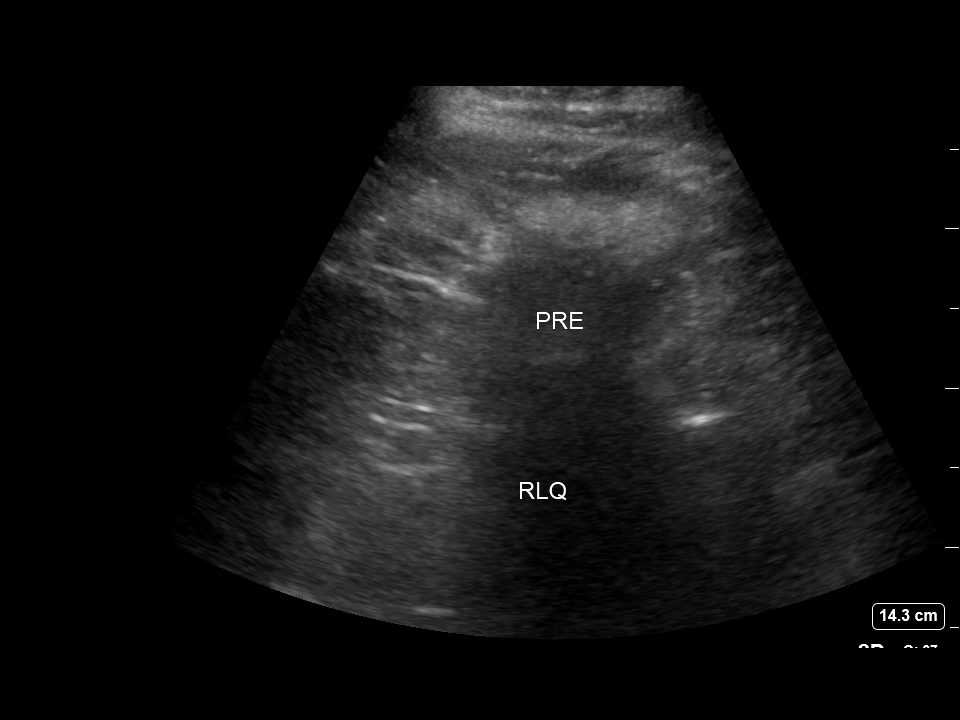
[im 3/5]
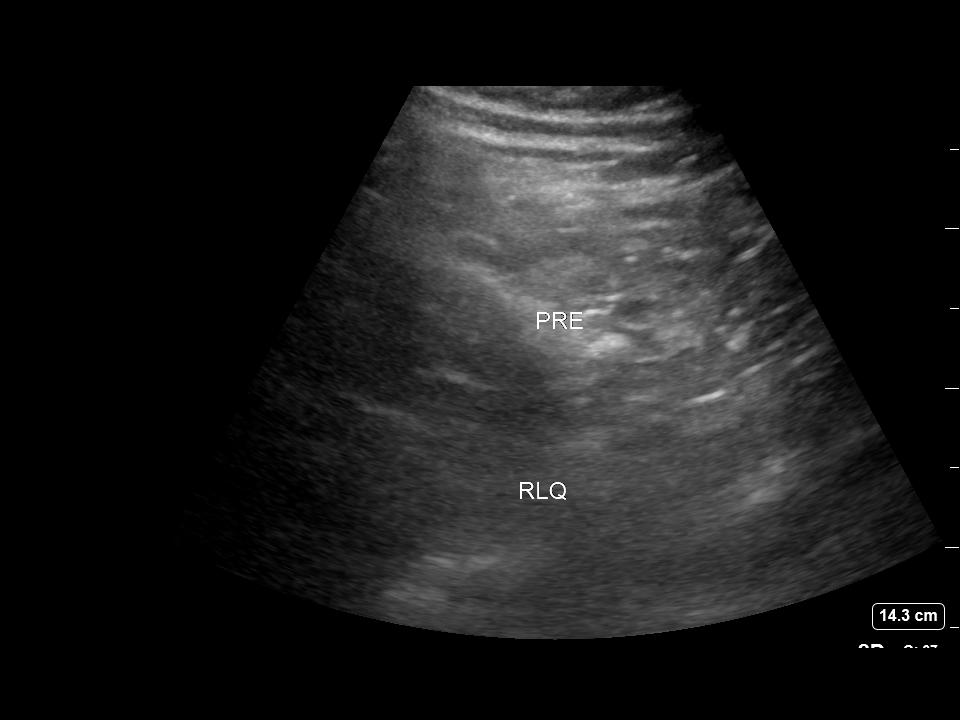
[im 4/5]
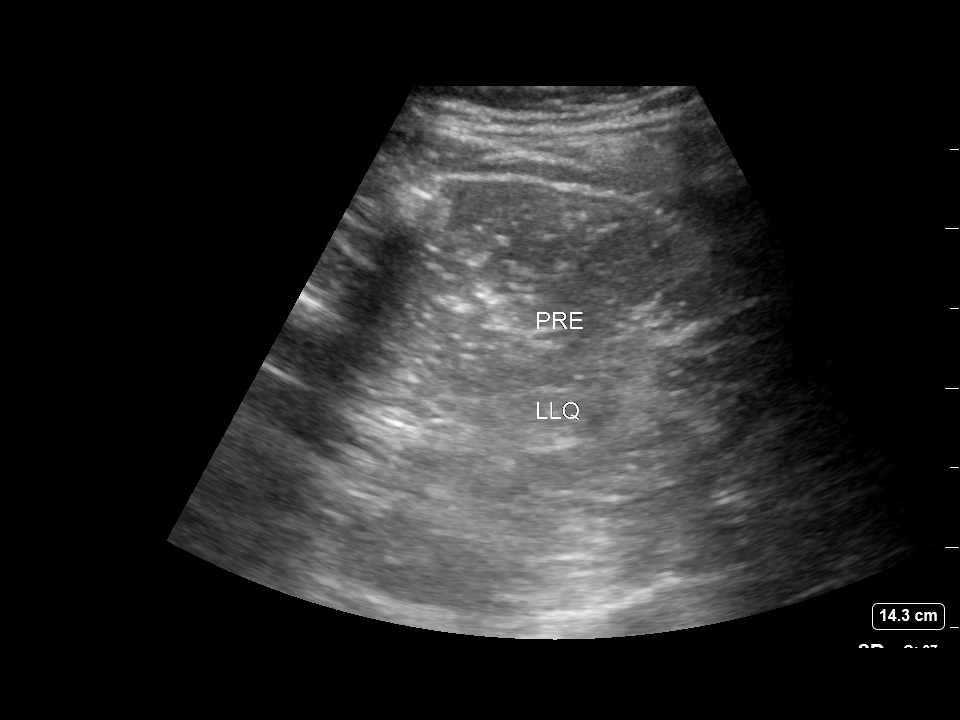
[im 5/5]
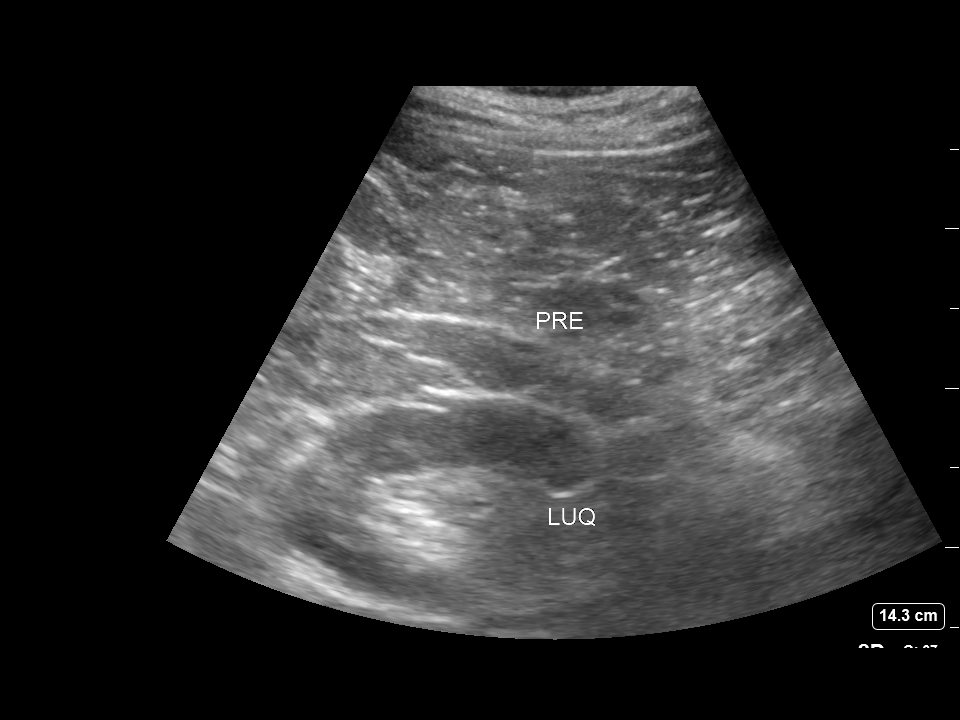

[5 of 5 positions shown; findings below may reference images not displayed]

FINDINGS: No ascites identified in the abdominal quadrants.
IMPRESSION: No ascites.

## 2020-09-23 DIAGNOSIS — I252 Old myocardial infarction: Secondary | ICD-10-CM | POA: Diagnosis not present

## 2020-09-23 DIAGNOSIS — Z955 Presence of coronary angioplasty implant and graft: Secondary | ICD-10-CM | POA: Diagnosis not present

## 2020-10-04 ENCOUNTER — Ambulatory Visit: Payer: PPO | Admitting: Cardiology

## 2020-10-25 DIAGNOSIS — I10 Essential (primary) hypertension: Secondary | ICD-10-CM | POA: Diagnosis not present

## 2020-10-25 DIAGNOSIS — Z79899 Other long term (current) drug therapy: Secondary | ICD-10-CM | POA: Diagnosis not present

## 2020-10-25 DIAGNOSIS — Z7982 Long term (current) use of aspirin: Secondary | ICD-10-CM | POA: Diagnosis not present

## 2020-10-25 DIAGNOSIS — Z955 Presence of coronary angioplasty implant and graft: Secondary | ICD-10-CM | POA: Diagnosis not present

## 2020-10-25 DIAGNOSIS — I252 Old myocardial infarction: Secondary | ICD-10-CM | POA: Diagnosis not present

## 2020-11-05 ENCOUNTER — Telehealth: Payer: Self-pay

## 2020-11-05 NOTE — Telephone Encounter (Signed)
Faxed patient assistance form.

## 2020-11-18 ENCOUNTER — Encounter: Payer: Self-pay | Admitting: Cardiology

## 2020-11-18 ENCOUNTER — Ambulatory Visit: Payer: PPO | Admitting: Cardiology

## 2020-11-18 ENCOUNTER — Other Ambulatory Visit: Payer: Self-pay

## 2020-11-18 VITALS — BP 114/80 | HR 72 | Ht 70.0 in | Wt 182.2 lb

## 2020-11-18 DIAGNOSIS — Z794 Long term (current) use of insulin: Secondary | ICD-10-CM

## 2020-11-18 DIAGNOSIS — E118 Type 2 diabetes mellitus with unspecified complications: Secondary | ICD-10-CM

## 2020-11-18 DIAGNOSIS — I2510 Atherosclerotic heart disease of native coronary artery without angina pectoris: Secondary | ICD-10-CM

## 2020-11-18 DIAGNOSIS — E785 Hyperlipidemia, unspecified: Secondary | ICD-10-CM | POA: Diagnosis not present

## 2020-11-18 MED ORDER — ISOSORBIDE MONONITRATE ER 30 MG PO TB24: 30.0000 mg | ORAL_TABLET | Freq: Every day | ORAL | 3 refills | Status: DC

## 2020-11-18 NOTE — Patient Instructions (Addendum)
Medication Instructions:  Your physician has recommended you make the following change in your medication:  START: Imdur 30 mg once daily *If you need a refill on your cardiac medications before your next appointment, please call your pharmacy*   Lab Work: Your physician recommends that you return for lab work in:  TODAY: BMET, Mag, CBC, Lipids, HbgA1C, Lp(a ) If you have labs (blood work) drawn today and your tests are completely normal, you will receive your results only by: MyChart Message (if you have [place]) OR A paper copy in the mail If you have any lab test that is abnormal or we need to change your treatment, we will call you to review the results.   Testing/Procedures: None   Follow-Up: At [facility], you and your health needs are our priority.  As part of our continuing mission to provide you with exceptional heart care, we have created designated Provider Care Teams.  These Care Teams include your primary Cardiologist (physician) and Advanced Practice Providers (APPs -  Physician Assistants and Nurse Practitioners) who all work together to provide you with the care you need, when you need it.  We recommend signing up for the patient portal called "MyChart".  Sign up information is provided on this After Visit Summary.  MyChart is used to connect with patients for Virtual Visits (Telemedicine).  Patients are able to view lab/test results, encounter notes, upcoming appointments, etc.  Non-urgent messages can be sent to your provider as well.   To learn more about what you can do with MyChart, go to [email or web address].    Your next appointment:   6 month(s)  The format for your next appointment:   In Person  Provider:   Jenne Campus, MD   Other Instructions

## 2020-11-18 NOTE — Progress Notes (Signed)
Cardiology Office Note:    Date:  11/18/2020   ID:  Christopher Rogers, DOB [birth year], MRN [id number]  PCP:  Algis Greenhouse, MD  Cardiologist:  Jenne Campus, MD  Electrophysiologist:  None   Referring MD: Algis Greenhouse, MD    History of Present Illness:    Christopher Rogers is a 60 y.o. male with a hx of coronary artery disease status post PCI to the LAD which was done on Jun 26, 2020 as a result of a non-STEMI, syncope, hypertension, diabetes mellitus.   Patient presented initially to [facility] after syncopal event.  On presentation his troponin was elevated and he also admitted to some chest discomfort.  He was subsequently transferred to [facility] where he underwent a left heart catheterization and is now status post drug-eluting stent to his left anterior descending artery.  I saw the patient on Jul 11, 2020 at that time he was here for follow-up visit.  Prior to his presentation he had significant shortness of breath and had therefore stopped the Brilinta.  During that visit I transition the patient to Plavix.  How important it was to keep him on his dual antiplatelet therapy.  He was participating in cardiac rehab.  Since I saw the patient he tells me he has had some episodes of intermittent chest discomfort.  He notes that it is occurring mostly on exertion.  Past Medical History:  Diagnosis Date   ADD (attention deficit hyperactivity disorder, inattentive type)    Anxiety    Arthritis    Bradycardia 06/27/2020   CAD in native artery 06/27/2020   Chest pain    a. 12/2016 Eval @ [facility]- reportedly neg MV w/ hypertensive response to exercise.   Depression    Essential hypertension    a. Dx 12/2016.   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   Non-ST elevation (NSTEMI) myocardial infarction [facility]) 06/27/2020   S/P angioplasty with stent to LAD 06/26/20 06/27/2020    Past Surgical History:  Procedure  Laterality Date   BICEPS TENDON REPAIR Left    COLONOSCOPY     CORONARY STENT INTERVENTION N/A 06/26/2020   Procedure: CORONARY STENT INTERVENTION;  Surgeon: Troy Sine, MD;  Location: [place] [facility];  Service: Cardiovascular;  Laterality: N/A;   EYE SURGERY     LEFT HEART CATH AND CORONARY ANGIOGRAPHY N/A 06/26/2020   Procedure: LEFT HEART CATH AND CORONARY ANGIOGRAPHY;  Surgeon: Troy Sine, MD;  Location: [place] [facility];  Service: Cardiovascular;  Laterality: N/A;   LUMBAR LAMINECTOMY/DECOMPRESSION MICRODISCECTOMY Right 04/20/2013   Procedure: L3-L4 DECOMPRESSION DISCECTOMY (1 LEVEL);  Surgeon: Melina Schools, MD;  Location: [place];  Service: Orthopedics;  Laterality: Right;   PROSTATECTOMY N/A 12/07/2014   Procedure: OPEN SUPRAPUBIC PROSTATECTOMY ;  Surgeon: Carolan Clines, MD;  Location: [facility];  Service: Urology;  Laterality: N/A;   SHOULDER ARTHROSCOPY Left    clavical repair x2   VARICOSE VEIN SURGERY Bilateral    WISDOM TOOTH EXTRACTION      Current Medications: Current Meds  Medication Sig   acetaminophen (TYLENOL) 325 MG tablet Take 2 tablets (650 mg total) by mouth every 4 (four) hours as needed for headache or mild pain.   aspirin 81 MG chewable tablet Chew 1 tablet (81 mg total) by mouth daily.   atorvastatin (LIPITOR) 80 MG tablet Take 1 tablet (80 mg total) by mouth daily.   clopidogrel (  PLAVIX) 75 MG tablet Take 1 tablet (75 mg total) by mouth daily. Take 300 mg (first dose - 4 pills) then 75 mg once daily (1 pill)   empagliflozin (JARDIANCE) 25 MG TABS tablet Take 25 mg by mouth daily.   escitalopram (LEXAPRO) 20 MG tablet Take 20 mg by mouth at bedtime.    Insulin Glargine (TOUJEO SOLOSTAR [place]) Inject 85 Units into the skin daily.   isosorbide mononitrate (IMDUR) 30 MG 24 hr tablet Take 1 tablet (30 mg total) by mouth daily.   Multiple Vitamin (MULTIVITAMIN WITH MINERALS) TABS tablet Take 1 tablet daily by mouth. (Patient taking differently: Take 1 tablet  by mouth daily. Unknown strenght)   nitroGLYCERIN (NITROSTAT) 0.4 MG SL tablet Place 1 tablet (0.4 mg total) under the tongue every 5 (five) minutes as needed for chest pain.   Current Facility-Administered Medications for the 11/18/20 encounter (Office Visit) with Berniece Salines, DO  Medication   nitroGLYCERIN (NITROSTAT) SL tablet 0.4 mg     Allergies:   Patient has no known allergies.   Social History   Socioeconomic History   Marital status: Married    Spouse name: Not on file   Number of children: Not on file   Years of education: Not on file   Highest education level: Not on file  Occupational History    Comment: Therapist, [employer]  Tobacco Use   Smoking status: Never   Smokeless tobacco: Current    Types: Chew  Vaping Use   Vaping Use: Never used  Substance and Sexual Activity   Alcohol use: Not Currently    Comment: sober for 2 years, past alcoholic   Drug use: No   Sexual activity: Not on file    Comment: 1 can per month  Other Topics Concern   Not on file  Social History Narrative   Lives in [place] with his mother.  Separated from wife x 15 mos.  2 grown children, 4 grandchildren.  Exercises most days of the week - walks on treadmill for an hour - 2.4 mph, no incline.   Social Determinants of Health   Financial Resource Strain: Not on file  Food Insecurity: Not on file  Transportation Needs: Not on file  Physical Activity: Not on file  Stress: Not on file  Social Connections: Not on file     Family History: The patient's family history includes Alzheimer's disease in his father; Other in his mother.  ROS:   Review of Systems  Constitution: Negative for decreased appetite, fever and weight gain.  HENT: Negative for congestion, ear discharge, hoarse voice and sore throat.   Eyes: Negative for discharge, redness, vision loss in right eye and visual halos.  Cardiovascular: Reports intermittent chest pain.negative for dyspnea on exertion, leg swelling,  orthopnea and palpitations.  Respiratory: Negative for cough, hemoptysis, shortness of breath and snoring.   Endocrine: Negative for heat intolerance and polyphagia.  Hematologic/Lymphatic: Negative for bleeding problem. Does not bruise/bleed easily.  Skin: Negative for flushing, nail changes, rash and suspicious lesions.  Musculoskeletal: Negative for arthritis, joint pain, muscle cramps, myalgias, neck pain and stiffness.  Gastrointestinal: Negative for abdominal pain, bowel incontinence, diarrhea and excessive appetite.  Genitourinary: Negative for decreased libido, genital sores and incomplete emptying.  Neurological: Negative for brief paralysis, focal weakness, headaches and loss of balance.  Psychiatric/Behavioral: Negative for altered mental status, depression and suicidal ideas.  Allergic/Immunologic: Negative for HIV exposure and persistent infections.    EKGs/Labs/Other Studies Reviewed:  The following studies were reviewed today:   EKG:  The ekg ordered today demonstrates sinus rhythm, heart rate 72 bpm no significant change compared to prior EKG  LHC  Cardiac cath 06/26/20   Prox LAD to Mid LAD lesion is 95% stenosed. Ost LAD lesion is 40% stenosed. 1st Diag lesion is 50% stenosed. Post intervention, there is a 0% residual stenosis. Post intervention, there is a 35% residual stenosis. A stent was successfully placed.   Single-vessel CAD involving the left anterior descending artery with 40% smooth ostial narrowing and 95% stenosis in the proximal LAD in the region of the first diagonal takeoff with mild 40% ostial narrowing of the diagonal vessel.   Normal ramus intermedius, left circumflex coronary artery, and RCA.   Successful percutaneous coronary intervention to the subtotal LAD stenosis treated with initial 2.5 x 15 mm and 3.0 x 15 mm cutting balloon intervention followed by DES stenting with a 4.0 x 18 mm Resolute Onyx stent postdilated to 4.36 mm with a 95 to 99%  stenosis being reduced to 0%.  There was mild additional ostial narrowing of the diagonal vessel up to 60 to 70% post high-pressure balloon inflation within the stented segment and PTCA of the diagonal vessel was performed with a 2.5 x 12 mm balloon with the stenosis being reduced to 35 to 40% ostially.  There is ostial narrowing of a septal perforating artery arising from the stented segment.   LVEDP 3 mmHg.   RECOMMENDATION: DAPT with aspirin/Brilinta.  Medical therapy for mild concomitant CAD involving the diagonal and ostial LAD.  Aggressive lipid-lowering therapy with target LDL less than 70.  Will initiate atorvastatin 80 mg.  Cardiac telemetry with possible subsequent event monitoring post successful reperfusion of his high-grade LAD.   Diagnostic    Recent Labs: 06/27/2020: BUN 12; Creatinine, Ser 0.85; Hemoglobin 14.8; Platelets 156; Potassium 4.2; Sodium 139  Recent Lipid Panel    Component Value Date/Time   CHOL 175 06/19/2018 0336   TRIG 227 (H) 06/19/2018 0336   HDL 34 (L) 06/19/2018 0336   CHOLHDL 5.1 06/19/2018 0336   VLDL 45 (H) 06/19/2018 0336   LDLCALC 96 06/19/2018 0336    Physical Exam:    VS:  BP 114/80 (BP Location: Right Arm, Patient Position: Sitting)   Pulse 72   Ht 5\' 10"  (1.778 m)   Wt 182 lb 3.2 oz (82.6 kg)   SpO2 99%   BMI 26.14 kg/m     Wt Readings from Last 3 Encounters:  11/18/20 182 lb 3.2 oz (82.6 kg)  07/11/20 177 lb (80.3 kg)  06/27/20 177 lb 6.4 oz (80.5 kg)     GEN: Well nourished, well developed in no acute distress HEENT: Normal NECK: No JVD; No carotid bruits LYMPHATICS: No lymphadenopathy CARDIAC: S1S2 noted,RRR, no murmurs, rubs, gallops RESPIRATORY:  Clear to auscultation without rales, wheezing or rhonchi  ABDOMEN: Soft, non-tender, non-distended, +bowel sounds, no guarding. EXTREMITIES: No edema, No cyanosis, no clubbing MUSCULOSKELETAL:  No deformity  SKIN: Warm and dry NEUROLOGIC:  Alert and oriented x 3,  non-focal PSYCHIATRIC:  Normal affect, good insight  ASSESSMENT:    1. Coronary artery disease involving native coronary artery of native heart, unspecified whether angina present   2. Hyperlipidemia, unspecified hyperlipidemia type   3. Type 2 diabetes mellitus with complication, with long-term current use of insulin ([place])   4. Type 2 diabetes mellitus with complication, without long-term current use of insulin ([place])    PLAN:  He is having intermittent chest discomfort is concerning for stable angina.  Going to add Imdur to the patient medication regimen-Imdur 30 mg daily.  Hoping this will help.  He has completed cardiac rehab.    In the meantime we will continue his aspirin as well as his Plavix.  We will get blood work today for [employer], mag as well as lipid profile and LP(a).  His goal is less than 55 and if he has not gotten to target on this lab work I will add Zetia to his regimen.  The patient is in agreement with the above plan. The patient left the office in stable condition.  The patient will follow up in 6 months with Dr. Agustin Cree.   Medication Adjustments/Labs and Tests Ordered: Current medicines are reviewed at length with the patient today.  Concerns regarding medicines are outlined above.  Orders Placed This Encounter  Procedures   Magnesium   Lipid panel   Hemoglobin A1c   CBC with Differential/Platelet   Basic Metabolic Panel (BMET)   Lipoprotein A (LPA)   EKG 12-Lead   Meds ordered this encounter  Medications   isosorbide mononitrate (IMDUR) 30 MG 24 hr tablet    Sig: Take 1 tablet (30 mg total) by mouth daily.    Dispense:  90 tablet    Refill:  3    Patient Instructions  Medication Instructions:  Your physician has recommended you make the following change in your medication:  START: Imdur 30 mg once daily *If you need a refill on your cardiac medications before your next appointment, please call your pharmacy*   Lab Work: Your physician  recommends that you return for lab work in:  TODAY: BMET, Mag, CBC, Lipids, HbgA1C, Lp(a ) If you have labs (blood work) drawn today and your tests are completely normal, you will receive your results only by: MyChart Message (if you have [place]) OR A paper copy in the mail If you have any lab test that is abnormal or we need to change your treatment, we will call you to review the results.   Testing/Procedures: None   Follow-Up: At [facility], you and your health needs are our priority.  As part of our continuing mission to provide you with exceptional heart care, we have created designated Provider Care Teams.  These Care Teams include your primary Cardiologist (physician) and Advanced Practice Providers (APPs -  Physician Assistants and Nurse Practitioners) who all work together to provide you with the care you need, when you need it.  We recommend signing up for the patient portal called "MyChart".  Sign up information is provided on this After Visit Summary.  MyChart is used to connect with patients for Virtual Visits (Telemedicine).  Patients are able to view lab/test results, encounter notes, upcoming appointments, etc.  Non-urgent messages can be sent to your provider as well.   To learn more about what you can do with MyChart, go to [email or web address].    Your next appointment:   6 month(s)  The format for your next appointment:   In Person  Provider:   Jenne Campus, MD   Other Instructions     Adopting a Healthy Lifestyle.  Know what a healthy weight is for you (roughly BMI <25) and aim to maintain this   Aim for 7+ servings of fruits and vegetables daily   65-80+ fluid ounces of water or unsweet tea for healthy kidneys   Limit to max 1 drink of alcohol per day; avoid smoking/tobacco  Limit animal fats in diet for cholesterol and heart health - choose grass fed whenever available   Avoid highly processed foods, and foods high in saturated/trans  fats   Aim for low stress - take time to unwind and care for your mental health   Aim for 150 min of moderate intensity exercise weekly for heart health, and weights twice weekly for bone health   Aim for 7-9 hours of sleep daily   When it comes to diets, agreement about the perfect plan isnt easy to find, even among the experts. Experts at the [employer] developed an idea known as the Healthy Eating Plate. Just imagine a plate divided into logical, healthy portions.   The emphasis is on diet quality:   Load up on vegetables and fruits - one-half of your plate: Aim for color and variety, and remember that potatoes dont count.   Go for whole grains - one-quarter of your plate: Whole wheat, barley, wheat berries, quinoa, oats, brown rice, and foods made with them. If you want pasta, go with whole wheat pasta.   Protein power - one-quarter of your plate: Fish, chicken, beans, and nuts are all healthy, versatile protein sources. Limit red meat.   The diet, however, does go beyond the plate, offering a few other suggestions.   Use healthy plant oils, such as olive, canola, soy, corn, sunflower and peanut. Check the labels, and avoid partially hydrogenated oil, which have unhealthy trans fats.   If youre thirsty, drink water. Coffee and tea are good in moderation, but skip sugary drinks and limit milk and dairy products to one or two daily servings.   The type of carbohydrate in the diet is more important than the amount. Some sources of carbohydrates, such as vegetables, fruits, whole grains, and beans-are healthier than others.   Finally, stay active  Signed, Berniece Salines, DO  11/18/2020 11:31 AM    [place]

## 2020-11-19 ENCOUNTER — Other Ambulatory Visit: Payer: Self-pay

## 2020-11-19 DIAGNOSIS — Z794 Long term (current) use of insulin: Secondary | ICD-10-CM | POA: Diagnosis not present

## 2020-11-19 DIAGNOSIS — I2510 Atherosclerotic heart disease of native coronary artery without angina pectoris: Secondary | ICD-10-CM

## 2020-11-19 DIAGNOSIS — E118 Type 2 diabetes mellitus with unspecified complications: Secondary | ICD-10-CM

## 2020-11-19 DIAGNOSIS — I10 Essential (primary) hypertension: Secondary | ICD-10-CM | POA: Diagnosis not present

## 2020-11-20 LAB — CBC WITH DIFFERENTIAL/PLATELET
Basophils Absolute: 0 10*3/uL (ref 0.0–0.2)
Basos: 1 %
EOS (ABSOLUTE): 0.3 10*3/uL (ref 0.0–0.4)
Eos: 5 %
Hematocrit: 47.1 % (ref 37.5–51.0)
Hemoglobin: 15.5 g/dL (ref 13.0–17.7)
Immature Grans (Abs): 0 10*3/uL (ref 0.0–0.1)
Immature Granulocytes: 0 %
Lymphocytes Absolute: 1.8 10*3/uL (ref 0.7–3.1)
Lymphs: 35 %
MCH: 28.3 pg (ref 26.6–33.0)
MCHC: 32.9 g/dL (ref 31.5–35.7)
MCV: 86 fL (ref 79–97)
Monocytes Absolute: 0.4 10*3/uL (ref 0.1–0.9)
Monocytes: 8 %
Neutrophils Absolute: 2.6 10*3/uL (ref 1.4–7.0)
Neutrophils: 51 %
Platelets: 183 10*3/uL (ref 150–450)
RBC: 5.48 x10E6/uL (ref 4.14–5.80)
RDW: 12.8 % (ref 11.6–15.4)
WBC: 5.1 10*3/uL (ref 3.4–10.8)

## 2020-11-20 LAB — HEMOGLOBIN A1C
Est. average glucose Bld gHb Est-mCnc: 189 mg/dL
Hgb A1c MFr Bld: 8.2 % — ABNORMAL HIGH (ref 4.8–5.6)

## 2020-11-21 LAB — CBC WITH DIFFERENTIAL/PLATELET

## 2020-11-21 LAB — BASIC METABOLIC PANEL
BUN/Creatinine Ratio: 18 (ref 10–24)
BUN: 16 mg/dL (ref 8–27)
CO2: 22 mmol/L (ref 20–29)
Calcium: 9.6 mg/dL (ref 8.6–10.2)
Chloride: 99 mmol/L (ref 96–106)
Creatinine, Ser: 0.91 mg/dL (ref 0.76–1.27)
Glucose: 133 mg/dL — ABNORMAL HIGH (ref 70–99)
Potassium: 5.1 mmol/L (ref 3.5–5.2)
Sodium: 140 mmol/L (ref 134–144)
eGFR: 96 mL/min/{1.73_m2} (ref >59)

## 2020-11-21 LAB — MAGNESIUM: Magnesium: 1.8 mg/dL (ref 1.6–2.3)

## 2020-11-21 LAB — LIPID PANEL
Chol/HDL Ratio: 1.8 ratio (ref 0.0–5.0)
Cholesterol, Total: 117 mg/dL (ref 100–199)
HDL: 66 mg/dL (ref >39)
LDL Chol Calc (NIH): 40 mg/dL (ref 0–99)
Triglycerides: 43 mg/dL (ref 0–149)
VLDL Cholesterol Cal: 11 mg/dL (ref 5–40)

## 2020-11-21 LAB — LIPOPROTEIN A (LPA): Lipoprotein (a): 37.7 nmol/L (ref <75.0)

## 2020-11-21 LAB — HEMOGLOBIN A1C

## 2021-01-10 DIAGNOSIS — Z794 Long term (current) use of insulin: Secondary | ICD-10-CM | POA: Diagnosis not present

## 2021-01-10 DIAGNOSIS — I10 Essential (primary) hypertension: Secondary | ICD-10-CM | POA: Diagnosis not present

## 2021-01-10 DIAGNOSIS — Z0000 Encounter for general adult medical examination without abnormal findings: Secondary | ICD-10-CM | POA: Diagnosis not present

## 2021-01-10 DIAGNOSIS — E119 Type 2 diabetes mellitus without complications: Secondary | ICD-10-CM | POA: Diagnosis not present

## 2021-01-10 DIAGNOSIS — Z7984 Long term (current) use of oral hypoglycemic drugs: Secondary | ICD-10-CM | POA: Diagnosis not present

## 2021-01-10 DIAGNOSIS — E7800 Pure hypercholesterolemia, unspecified: Secondary | ICD-10-CM | POA: Diagnosis not present

## 2021-01-10 DIAGNOSIS — F3340 Major depressive disorder, recurrent, in remission, unspecified: Secondary | ICD-10-CM | POA: Diagnosis not present

## 2021-01-10 DIAGNOSIS — Z87891 Personal history of nicotine dependence: Secondary | ICD-10-CM | POA: Diagnosis not present

## 2021-01-10 DIAGNOSIS — I25110 Atherosclerotic heart disease of native coronary artery with unstable angina pectoris: Secondary | ICD-10-CM | POA: Diagnosis not present

## 2021-05-22 ENCOUNTER — Ambulatory Visit: Payer: PPO | Admitting: Cardiology

## 2021-05-22 VITALS — BP 88/60 | HR 96 | Ht 70.0 in | Wt 187.6 lb

## 2021-05-22 DIAGNOSIS — I10 Essential (primary) hypertension: Secondary | ICD-10-CM | POA: Diagnosis not present

## 2021-05-22 DIAGNOSIS — I2510 Atherosclerotic heart disease of native coronary artery without angina pectoris: Secondary | ICD-10-CM

## 2021-05-22 DIAGNOSIS — N529 Male erectile dysfunction, unspecified: Secondary | ICD-10-CM | POA: Diagnosis not present

## 2021-05-22 DIAGNOSIS — Z95820 Peripheral vascular angioplasty status with implants and grafts: Secondary | ICD-10-CM | POA: Diagnosis not present

## 2021-05-22 NOTE — Addendum Note (Signed)
Addended by: Edwyna Shell I on: 05/22/2021 02:09 PM ? ? Modules accepted: Orders ? ?

## 2021-05-22 NOTE — Progress Notes (Signed)
?Cardiology Office Note:   ? ?Date:  05/22/2021  ? ?ID:  Christopher Rogers, DOB 26-May-1960, MRN [id number] ? ?PCP:  Algis Greenhouse, MD  ?Cardiologist:  Jenne Campus, MD   ? ?Referring MD: Algis Greenhouse, MD  ? ?Chief Complaint  ?Patient presents with  ? Medication Refill  ?  Nitroglycerin  ? ? ?History of Present Illness:   ? ?Christopher Rogers is a 61 y.o. male with past medical history significant for coronary artery disease in Jun 26, 2020 he required PTCA and stenting of the proximal portion of these LAD in view of non-STEMI.  Also at that time he had syncope, hypertension diabetes.  He is in my office today for follow-up.  Overall he is doing very well.  He goes to gym on the regular basis.  He exercise every day and walking for about 1 hour on the treadmill difference.  After 3.5 mph it up to 3% incline.  Denies having a problem doing that.  He brought a new issue today to me.  He is described to have erectile dysfunction as well as wondering if he can use Viagra that he was before however I am afraid will not be able to do it because of his blood pressure being low. ? ?Past Medical History:  ?Diagnosis Date  ? ADD (attention deficit hyperactivity disorder, inattentive type)   ? Anxiety   ? Arthritis   ? Bradycardia 06/27/2020  ? CAD in native artery 06/27/2020  ? Chest pain   ? a. 12/2016 Eval @ [facility]- reportedly neg MV w/ hypertensive response to exercise.  ? Depression   ? Essential hypertension   ? a. Dx 12/2016.  ? Insomnia   ? Morbid obesity ([place])   ? 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 pons  ? Non-ST elevation (NSTEMI) myocardial infarction ([place]) 06/27/2020  ? S/P angioplasty with stent to LAD 06/26/20 06/27/2020  ? ? ?Past Surgical History:  ?Procedure Laterality Date  ? BICEPS TENDON REPAIR Left   ? COLONOSCOPY    ? CORONARY STENT INTERVENTION N/A 06/26/2020  ? Procedure: CORONARY STENT INTERVENTION;  Surgeon: Troy Sine, MD;  Location: [place] [facility];  Service: Cardiovascular;   Laterality: N/A;  ? EYE SURGERY    ? LEFT HEART CATH AND CORONARY ANGIOGRAPHY N/A 06/26/2020  ? Procedure: LEFT HEART CATH AND CORONARY ANGIOGRAPHY;  Surgeon: Troy Sine, MD;  Location: [place] [facility];  Service: Cardiovascular;  Laterality: N/A;  ? LUMBAR LAMINECTOMY/DECOMPRESSION MICRODISCECTOMY Right 04/20/2013  ? Procedure: L3-L4 DECOMPRESSION DISCECTOMY (1 LEVEL);  Surgeon: Melina Schools, MD;  Location: [facility];  Service: Orthopedics;  Laterality: Right;  ? PROSTATECTOMY N/A 12/07/2014  ? Procedure: OPEN SUPRAPUBIC PROSTATECTOMY ;  Surgeon: Carolan Clines, MD;  Location[facility];  Service: Urology;  Laterality: N/A;  ? SHOULDER ARTHROSCOPY Left   ? clavical repair x2  ? VARICOSE VEIN SURGERY Bilateral   ? WISDOM TOOTH EXTRACTION    ? ? ?Current Medications: ?Current Meds  ?Medication Sig  ? acetaminophen (TYLENOL) 325 MG tablet Take 2 tablets (650 mg total) by mouth every 4 (four) hours as needed for headache or mild pain.  ? atorvastatin (LIPITOR) 80 MG tablet Take 1 tablet (80 mg total) by mouth daily.  ? clopidogrel (PLAVIX) 75 MG tablet Take 1 tablet (75 mg total) by mouth daily. Take 300 mg (first dose - 4 pills) then 75 mg once daily (1 pill)  ? empagliflozin (JARDIANCE) 25 MG TABS tablet Take 25  mg by mouth daily.  ? escitalopram (LEXAPRO) 20 MG tablet Take 20 mg by mouth at bedtime.   ? Insulin Glargine (TOUJEO SOLOSTAR [place]) Inject 95 Units into the skin daily.  ? nitroGLYCERIN (NITROSTAT) 0.4 MG SL tablet Place 1 tablet (0.4 mg total) under the tongue every 5 (five) minutes as needed for chest pain.  ? [DISCONTINUED] Multiple Vitamin (MULTIVITAMIN WITH MINERALS) TABS tablet Take 1 tablet daily by mouth. (Patient taking differently: Take 1 tablet by mouth daily. Unknown strenght)  ? ?Current Facility-Administered Medications for the 05/22/21 encounter (Office Visit) with Park Liter, MD  ?Medication  ? nitroGLYCERIN (NITROSTAT) SL tablet 0.4 mg  ?  ? ?Allergies:   Patient has no known  allergies.  ? ?Social History  ? ?Socioeconomic History  ? Marital status: Married  ?  Spouse name: Not on file  ? Number of children: Not on file  ? Years of education: Not on file  ? Highest education level: Not on file  ?Occupational History  ?  Comment: Building control surveyor  ?Tobacco Use  ? Smoking status: Never  ? Smokeless tobacco: Current  ?  Types: Chew  ?Vaping Use  ? Vaping Use: Never used  ?Substance and Sexual Activity  ? Alcohol use: Not Currently  ?  Comment: sober for 2 years, past alcoholic  ? Drug use: No  ? Sexual activity: Not on file  ?  Comment: 1 can per month  ?Other Topics Concern  ? Not on file  ?Social History Narrative  ? Lives in [place] with his mother.  Separated from wife x 15 mos.  2 grown children, 4 grandchildren.  Exercises most days of the week - walks on treadmill for an hour - 2.4 mph, no incline.  ? ?Social Determinants of Health  ? ?Financial Resource Strain: Not on file  ?Food Insecurity: Not on file  ?Transportation Needs: Not on file  ?Physical Activity: Not on file  ?Stress: Not on file  ?Social Connections: Not on file  ?  ? ?Family History: ?The patient's family history includes Alzheimer's disease in his father; Other in his mother. ?ROS:   ?Please see the history of present illness.    ?All 14 point review of systems negative except as described per history of present illness ? ?EKGs/Labs/Other Studies Reviewed:   ? ? ? ?Recent Labs: ?11/18/2020: BUN 16; Creatinine, Ser 0.91; Magnesium 1.8; Potassium 5.1; Sodium 140 ?11/19/2020: Hemoglobin 15.5; Platelets 183  ?Recent Lipid Panel ?   ?Component Value Date/Time  ? CHOL 117 11/18/2020 1123  ? TRIG 43 11/18/2020 1123  ? HDL 66 11/18/2020 1123  ? CHOLHDL 1.8 11/18/2020 1123  ? CHOLHDL 5.1 06/19/2018 0336  ? VLDL 45 (H) 06/19/2018 0336  ? [place] 40 11/18/2020 1123  ? ? ?Physical Exam:   ? ?VS:  BP (!) 88/60 (BP Location: Left Arm, Patient Position: Sitting)   Pulse 96   Ht '5\' 10"'$  (1.778 m)   Wt 187 lb 9.6 oz (85.1 kg)    SpO2 97%   BMI 26.92 kg/m?    ? ?Wt Readings from Last 3 Encounters:  ?05/22/21 187 lb 9.6 oz (85.1 kg)  ?11/18/20 182 lb 3.2 oz (82.6 kg)  ?07/11/20 177 lb (80.3 kg)  ?  ? ?GEN:  Well nourished, well developed in no acute distress ?HEENT: Normal ?NECK: No JVD; No carotid bruits ?LYMPHATICS: No lymphadenopathy ?CARDIAC: RRR, no murmurs, no rubs, no gallops ?RESPIRATORY:  Clear to auscultation without rales, wheezing or rhonchi  ?  ABDOMEN: Soft, non-tender, non-distended ?MUSCULOSKELETAL:  No edema; No deformity  ?SKIN: Warm and dry ?LOWER EXTREMITIES: no swelling ?NEUROLOGIC:  Alert and oriented x 3 ?PSYCHIATRIC:  Normal affect  ? ?ASSESSMENT:   ? ?1. S/P angioplasty with stent to LAD 06/26/20   ?2. Essential hypertension   ?3. CAD in native artery   ?4. Vasculogenic erectile dysfunction, unspecified vasculogenic erectile dysfunction type   ? ?PLAN:   ? ?In order of problems listed above: ? ?Status post angioplasty of LAD in May 2022.  He does not want to take anymore dual antiplatelet therapy stick only with Plavix because aspirin was bothering his stomach.  I think we should be fine since we already 11 months after stenting.  He is doing very well otherwise.  And completely asymptomatic. ?Essential hypertension.  Interestingly we have hypotension at this time.  I will check blood pressure in both arms.  We will schedule him to have echocardiogram to assess left ventricle ejection fraction. ?Erectile dysfunction.  He will be referred to urologist.  I am afraid he is not a candidate for Viagra secondary to low blood pressure.  He is already aware that he cannot use nitroglycerin.  When he was Viagra. ?Dyslipidemia I did review K PN which show me LDL 40 HDL 66 this is from September.  He is on high intense statin which I will continue. ? ? ?Medication Adjustments/Labs and Tests Ordered: ?Current medicines are reviewed at length with the patient today.  Concerns regarding medicines are outlined above.  ?No orders of  the defined types were placed in this encounter. ? ?Medication changes: No orders of the defined types were placed in this encounter. ? ? ?Signed, ?Park Liter, MD, [facility] ?05/22/2021 1:54 PM    ?[place]

## 2021-05-22 NOTE — Patient Instructions (Signed)

## 2021-05-23 ENCOUNTER — Ambulatory Visit (INDEPENDENT_AMBULATORY_CARE_PROVIDER_SITE_OTHER): Payer: PPO

## 2021-05-23 DIAGNOSIS — I2510 Atherosclerotic heart disease of native coronary artery without angina pectoris: Secondary | ICD-10-CM | POA: Diagnosis not present

## 2021-05-23 DIAGNOSIS — I10 Essential (primary) hypertension: Secondary | ICD-10-CM

## 2021-05-23 DIAGNOSIS — Z95820 Peripheral vascular angioplasty status with implants and grafts: Secondary | ICD-10-CM

## 2021-05-23 DIAGNOSIS — N529 Male erectile dysfunction, unspecified: Secondary | ICD-10-CM

## 2021-05-23 LAB — ECHOCARDIOGRAM COMPLETE
Area-P 1/2: 2.66 cm2
S' Lateral: 2.7 cm

## 2021-05-27 ENCOUNTER — Other Ambulatory Visit: Payer: Self-pay

## 2021-05-27 ENCOUNTER — Telehealth: Payer: Self-pay | Admitting: Cardiology

## 2021-05-27 MED ORDER — NITROGLYCERIN 0.4 MG SL SUBL: 0.4000 mg | SUBLINGUAL_TABLET | SUBLINGUAL | 4 refills | Status: DC | PRN

## 2021-05-27 NOTE — Telephone Encounter (Signed)
Patient states he is returning a call from today. I did not see any notes. 

## 2021-05-27 NOTE — Telephone Encounter (Signed)
Patient informed of results.  

## 2021-07-08 ENCOUNTER — Other Ambulatory Visit: Payer: Self-pay

## 2021-07-08 MED ORDER — CLOPIDOGREL BISULFATE 75 MG PO TABS: 75.0000 mg | ORAL_TABLET | Freq: Every day | ORAL | 2 refills | Status: DC

## 2021-11-10 ENCOUNTER — Encounter: Payer: Self-pay | Admitting: Cardiology

## 2021-11-10 ENCOUNTER — Ambulatory Visit: Payer: PPO | Attending: Cardiology | Admitting: Cardiology

## 2021-11-10 VITALS — BP 96/58 | HR 80 | Ht 70.0 in | Wt 182.4 lb

## 2021-11-10 DIAGNOSIS — R072 Precordial pain: Secondary | ICD-10-CM | POA: Diagnosis not present

## 2021-11-10 DIAGNOSIS — I2510 Atherosclerotic heart disease of native coronary artery without angina pectoris: Secondary | ICD-10-CM

## 2021-11-10 DIAGNOSIS — Z95820 Peripheral vascular angioplasty status with implants and grafts: Secondary | ICD-10-CM

## 2021-11-10 DIAGNOSIS — I10 Essential (primary) hypertension: Secondary | ICD-10-CM

## 2021-11-10 NOTE — Progress Notes (Signed)
Cardiology Office Note:    Date:  11/10/2021   ID:  Christopher Rogers, DOB [birth year], MRN [id number]  PCP:  Christopher Greenhouse, MD  Cardiologist:  Christopher Campus, MD    Referring MD: Christopher Greenhouse, MD   Chief Complaint  Patient presents with   Follow-up  Doing well  History of Present Illness:    Christopher Rogers is a 61 y.o. male with past medical history significant for coronary artery disease.  In May 03/29/2020 he required PTCA and stenting of the proximal portion of the LAD in view of non-STEMI at that time he also had syncope.  Additional problems include essential hypertension, history of morbid obesity but lost significant amount of weight, diabetes which is still poorly controlled.  He is in my office today for follow-up.  Overall he is doing very well.  He denies have any chest pain tightness squeezing pressure burning chest.  He walks a lot every single day he does 25 miles daily he is active.  No symptomatology suggest reactivation of the problem  Past Medical History:  Diagnosis Date   ADD (attention deficit hyperactivity disorder, inattentive type)    Anxiety    Arthritis    Bradycardia 06/27/2020   CAD in native artery 06/27/2020   Chest pain    a. 12/2016 Eval @ [facility]- reportedly neg MV w/ hypertensive response to exercise.   Depression    Essential hypertension    a. Dx 12/2016.   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   pons   Non-ST elevation (NSTEMI) myocardial infarction [facility]) 06/27/2020   S/P angioplasty with stent to LAD 06/26/20 06/27/2020    Past Surgical History:  Procedure Laterality Date   BICEPS TENDON REPAIR Left    COLONOSCOPY     CORONARY STENT INTERVENTION N/A 06/26/2020   Procedure: CORONARY STENT INTERVENTION;  Surgeon: Christopher Sine, MD;  Location: [place] [facility];  Service: Cardiovascular;  Laterality: N/A;   EYE SURGERY     LEFT HEART CATH AND CORONARY ANGIOGRAPHY N/A 06/26/2020   Procedure: LEFT  HEART CATH AND CORONARY ANGIOGRAPHY;  Surgeon: Christopher Sine, MD;  Location: [facility] [facility];  Service: Cardiovascular;  Laterality: N/A;   LUMBAR LAMINECTOMY/DECOMPRESSION MICRODISCECTOMY Right 04/20/2013   Procedure: L3-L4 DECOMPRESSION DISCECTOMY (1 LEVEL);  Surgeon: Christopher Schools, MD;  Location: [place];  Service: Orthopedics;  Laterality: Right;   PROSTATECTOMY N/A 12/07/2014   Procedure: OPEN SUPRAPUBIC PROSTATECTOMY ;  Surgeon: Christopher Clines, MD;  [facility];  Service: Urology;  Laterality: N/A;   SHOULDER ARTHROSCOPY Left    clavical repair x2   VARICOSE VEIN SURGERY Bilateral    WISDOM TOOTH EXTRACTION      Current Medications: Current Meds  Medication Sig   acetaminophen (TYLENOL) 325 MG tablet Take 2 tablets (650 mg total) by mouth every 4 (four) hours as needed for headache or mild pain.   atorvastatin (LIPITOR) 80 MG tablet Take 1 tablet (80 mg total) by mouth daily.   clopidogrel (PLAVIX) 75 MG tablet Take 1 tablet (75 mg total) by mouth daily. Take 300 mg (first dose - 4 pills) then 75 mg once daily (1 pill)   empagliflozin (JARDIANCE) 25 MG TABS tablet Take 25 mg by mouth daily.   escitalopram (LEXAPRO) 20 MG tablet Take 20 mg by mouth at bedtime.    Insulin Glargine (TOUJEO SOLOSTAR [place]) Inject 95 Units into the skin daily.   metFORMIN (GLUCOPHAGE)  1000 MG tablet Take 1,000 mg by mouth 2 (two) times daily with a meal.   nitroGLYCERIN (NITROSTAT) 0.4 MG SL tablet Place 1 tablet (0.4 mg total) under the tongue every 5 (five) minutes as needed for chest pain.   Current Facility-Administered Medications for the 11/10/21 encounter (Office Visit) with Christopher Liter, MD  Medication   nitroGLYCERIN (NITROSTAT) SL tablet 0.4 mg     Allergies:   Patient has no known allergies.   Social History   Socioeconomic History   Marital status: Married    Spouse name: Not on file   Number of children: Not on file   Years of education: Not on file   Highest education  level: Not on file  Occupational History    Comment: Therapist, [employer]  Tobacco Use   Smoking status: Never   Smokeless tobacco: Current    Types: Chew  Vaping Use   Vaping Use: Never used  Substance and Sexual Activity   Alcohol use: Not Currently    Comment: sober for 2 years, past alcoholic   Drug use: No   Sexual activity: Not on file    Comment: 1 can per month  Other Topics Concern   Not on file  Social History Narrative   Lives in [place] with his mother.  Separated from wife x 15 mos.  2 grown children, 4 grandchildren.  Exercises most days of the week - walks on treadmill for an hour - 2.4 mph, no incline.   Social Determinants of Health   Financial Resource Strain: Not on file  Food Insecurity: Not on file  Transportation Needs: Not on file  Physical Activity: Not on file  Stress: Not on file  Social Connections: Not on file     Family History: The patient's family history includes Alzheimer's disease in his father; Other in his mother. ROS:   Please see the history of present illness.    All 14 point review of systems negative except as described per history of present illness  EKGs/Labs/Other Studies Reviewed:      Recent Labs: 11/18/2020: BUN 16; Creatinine, Ser 0.91; Magnesium 1.8; Potassium 5.1; Sodium 140 11/19/2020: Hemoglobin 15.5; Platelets 183  Recent Lipid Panel    Component Value Date/Time   CHOL 117 11/18/2020 1123   TRIG 43 11/18/2020 1123   HDL 66 11/18/2020 1123   CHOLHDL 1.8 11/18/2020 1123   CHOLHDL 5.1 06/19/2018 0336   VLDL 45 (H) 06/19/2018 0336   LDLCALC 40 11/18/2020 1123    Physical Exam:    VS:  BP (!) 96/58 (BP Location: Left Arm, Patient Position: Sitting)   Pulse 80   Ht '5\' 10"'$  (1.778 m)   Wt 182 lb 6.4 oz (82.7 kg)   SpO2 94%   BMI 26.17 kg/m     Wt Readings from Last 3 Encounters:  11/10/21 182 lb 6.4 oz (82.7 kg)  05/22/21 187 lb 9.6 oz (85.1 kg)  11/18/20 182 lb 3.2 oz (82.6 kg)     GEN:  Well  nourished, well developed in no acute distress HEENT: Normal NECK: No JVD; No carotid bruits LYMPHATICS: No lymphadenopathy CARDIAC: RRR, no murmurs, no rubs, no gallops RESPIRATORY:  Clear to auscultation without rales, wheezing or rhonchi  ABDOMEN: Soft, non-tender, non-distended MUSCULOSKELETAL:  No edema; No deformity  SKIN: Warm and dry LOWER EXTREMITIES: no swelling NEUROLOGIC:  Alert and oriented x 3 PSYCHIATRIC:  Normal affect   ASSESSMENT:    1. CAD in native artery  2. Essential hypertension   3. S/P angioplasty with stent to LAD 06/26/20   4. Precordial pain    PLAN:    In order of problems listed above:  Coronary disease stable from that point review.  He had intolerance to aspirin therefore he takes Plavix.  Continue present management. Dyslipidemia,He is on high intense statin form of Lipitor 80.  I did review his K PN which show me his LDL of 62 and HDL 58 is a good cholesterol profile continue present management Diabetes mellitus, he is on Jardiance as well as metformin.  His hemoglobin A1c however last time is 9.5!.  He tells me today that he gets problem with sweet drinks and he like to drink close sodas and I told him that is an absolutely unacceptable he need to stop it otherwise we will place problem in the future again.  Hopefully he will be able to accomplish that goal   Medication Adjustments/Labs and Tests Ordered: Current medicines are reviewed at length with the patient today.  Concerns regarding medicines are outlined above.  No orders of the defined types were placed in this encounter.  Medication changes: No orders of the defined types were placed in this encounter.   Signed, Christopher Liter, MD, [facility] 11/10/2021 1:32 PM    [place]

## 2021-11-10 NOTE — Patient Instructions (Signed)

## 2022-05-11 ENCOUNTER — Other Ambulatory Visit: Payer: Self-pay | Admitting: Cardiology

## 2022-05-11 NOTE — Telephone Encounter (Signed)
Rx refill sent to pharmacy. 

## 2023-01-26 ENCOUNTER — Other Ambulatory Visit: Payer: Self-pay | Admitting: Cardiology

## 2023-05-19 ENCOUNTER — Other Ambulatory Visit: Payer: Self-pay | Admitting: Cardiology
